# Patient Record
Sex: Female | Born: 1950 | Race: White | Hispanic: No | Marital: Single | Smoking: Former smoker
Health system: Southern US, Community
[De-identification: ages and names within clinical notes are randomized; demographics above are authoritative.]

## PROBLEM LIST (undated history)

## (undated) DIAGNOSIS — M899 Disorder of bone, unspecified: Secondary | ICD-10-CM

## (undated) DIAGNOSIS — K279 Peptic ulcer, site unspecified, unspecified as acute or chronic, without hemorrhage or perforation: Secondary | ICD-10-CM

## (undated) DIAGNOSIS — M949 Disorder of cartilage, unspecified: Secondary | ICD-10-CM

## (undated) DIAGNOSIS — E785 Hyperlipidemia, unspecified: Secondary | ICD-10-CM

## (undated) DIAGNOSIS — I639 Cerebral infarction, unspecified: Secondary | ICD-10-CM

## (undated) HISTORY — PX: PARTIAL GASTRECTOMY: SHX2172

## (undated) HISTORY — DX: Disorder of cartilage, unspecified: M94.9

## (undated) HISTORY — PX: TUBAL LIGATION: SHX77

## (undated) HISTORY — DX: Disorder of bone, unspecified: M89.9

## (undated) HISTORY — PX: BREAST BIOPSY: SHX20

## (undated) HISTORY — DX: Peptic ulcer, site unspecified, unspecified as acute or chronic, without hemorrhage or perforation: K27.9

## (undated) HISTORY — DX: Cerebral infarction, unspecified: I63.9

## (undated) HISTORY — DX: Hyperlipidemia, unspecified: E78.5

---

## 1998-01-25 ENCOUNTER — Other Ambulatory Visit: Admission: RE | Admit: 1998-01-25 | Discharge: 1998-01-25 | Payer: Self-pay | Admitting: *Deleted

## 1999-06-26 DIAGNOSIS — I639 Cerebral infarction, unspecified: Secondary | ICD-10-CM

## 1999-06-26 HISTORY — DX: Cerebral infarction, unspecified: I63.9

## 1999-07-09 ENCOUNTER — Encounter: Payer: Self-pay | Admitting: *Deleted

## 1999-07-09 ENCOUNTER — Inpatient Hospital Stay (HOSPITAL_COMMUNITY): Admission: EM | Admit: 1999-07-09 | Discharge: 1999-07-12 | Payer: Self-pay | Admitting: *Deleted

## 1999-07-18 ENCOUNTER — Other Ambulatory Visit: Admission: RE | Admit: 1999-07-18 | Discharge: 1999-07-18 | Payer: Self-pay | Admitting: *Deleted

## 1999-08-07 ENCOUNTER — Ambulatory Visit (HOSPITAL_COMMUNITY): Admission: RE | Admit: 1999-08-07 | Discharge: 1999-08-07 | Payer: Self-pay | Admitting: *Deleted

## 1999-08-07 ENCOUNTER — Encounter: Payer: Self-pay | Admitting: *Deleted

## 2001-11-08 ENCOUNTER — Encounter: Payer: Self-pay | Admitting: Internal Medicine

## 2001-12-06 ENCOUNTER — Encounter: Payer: Self-pay | Admitting: Internal Medicine

## 2003-10-09 ENCOUNTER — Encounter: Payer: Self-pay | Admitting: Internal Medicine

## 2003-10-10 ENCOUNTER — Encounter: Admission: RE | Admit: 2003-10-10 | Discharge: 2003-10-10 | Payer: Self-pay | Admitting: Family Medicine

## 2003-10-10 ENCOUNTER — Encounter: Payer: Self-pay | Admitting: Internal Medicine

## 2006-10-14 ENCOUNTER — Encounter: Payer: Self-pay | Admitting: Internal Medicine

## 2006-10-14 ENCOUNTER — Ambulatory Visit: Payer: Self-pay | Admitting: Internal Medicine

## 2006-10-14 DIAGNOSIS — Z8673 Personal history of transient ischemic attack (TIA), and cerebral infarction without residual deficits: Secondary | ICD-10-CM | POA: Insufficient documentation

## 2006-10-14 DIAGNOSIS — Z8711 Personal history of peptic ulcer disease: Secondary | ICD-10-CM | POA: Insufficient documentation

## 2006-10-14 DIAGNOSIS — J449 Chronic obstructive pulmonary disease, unspecified: Secondary | ICD-10-CM | POA: Insufficient documentation

## 2007-03-11 ENCOUNTER — Telehealth (INDEPENDENT_AMBULATORY_CARE_PROVIDER_SITE_OTHER): Payer: Self-pay | Admitting: *Deleted

## 2007-03-11 ENCOUNTER — Ambulatory Visit: Payer: Self-pay | Admitting: Family Medicine

## 2007-03-11 LAB — CONVERTED CEMR LAB
Basophils Absolute: 0 10*3/uL (ref 0.0–0.1)
Basophils Relative: 0.5 % (ref 0.0–1.0)
Cholesterol: 219 mg/dL (ref 0–200)
Direct LDL: 165.6 mg/dL
Eosinophils Absolute: 0.1 10*3/uL (ref 0.0–0.6)
Eosinophils Relative: 1.3 % (ref 0.0–5.0)
Glucose, Bld: 87 mg/dL (ref 70–99)
HCT: 48.8 % — ABNORMAL HIGH (ref 36.0–46.0)
HDL: 49.8 mg/dL (ref >39.0)
Hemoglobin: 16.6 g/dL — ABNORMAL HIGH (ref 12.0–15.0)
Lymphocytes Relative: 20.5 % (ref 12.0–46.0)
MCHC: 34 g/dL (ref 30.0–36.0)
MCV: 90.1 fL (ref 78.0–100.0)
Monocytes Absolute: 0.4 10*3/uL (ref 0.2–0.7)
Monocytes Relative: 5.3 % (ref 3.0–11.0)
Neutro Abs: 5.6 10*3/uL (ref 1.4–7.7)
Neutrophils Relative %: 72.4 % (ref 43.0–77.0)
Platelets: 249 10*3/uL (ref 150–400)
RBC: 5.41 M/uL — ABNORMAL HIGH (ref 3.87–5.11)
RDW: 13.1 % (ref 11.5–14.6)
TSH: 0.71 microintl units/mL (ref 0.35–5.50)
Total CHOL/HDL Ratio: 4.4
Triglycerides: 67 mg/dL (ref 0–149)
VLDL: 13 mg/dL (ref 0–40)
WBC: 7.7 10*3/uL (ref 4.5–10.5)

## 2007-03-14 ENCOUNTER — Encounter (INDEPENDENT_AMBULATORY_CARE_PROVIDER_SITE_OTHER): Payer: Self-pay | Admitting: *Deleted

## 2007-06-13 ENCOUNTER — Encounter (INDEPENDENT_AMBULATORY_CARE_PROVIDER_SITE_OTHER): Payer: Self-pay | Admitting: Family Medicine

## 2008-01-03 ENCOUNTER — Encounter: Payer: Self-pay | Admitting: Family Medicine

## 2008-01-12 ENCOUNTER — Encounter (INDEPENDENT_AMBULATORY_CARE_PROVIDER_SITE_OTHER): Payer: Self-pay | Admitting: *Deleted

## 2008-02-20 ENCOUNTER — Ambulatory Visit: Payer: Self-pay | Admitting: Internal Medicine

## 2008-02-20 DIAGNOSIS — E785 Hyperlipidemia, unspecified: Secondary | ICD-10-CM | POA: Insufficient documentation

## 2008-02-27 ENCOUNTER — Ambulatory Visit: Payer: Self-pay | Admitting: Internal Medicine

## 2008-03-02 ENCOUNTER — Telehealth (INDEPENDENT_AMBULATORY_CARE_PROVIDER_SITE_OTHER): Payer: Self-pay | Admitting: *Deleted

## 2008-03-02 LAB — CONVERTED CEMR LAB
BUN: 7 mg/dL (ref 6–23)
Cholesterol: 222 mg/dL (ref 0–200)
Creatinine, Ser: 0.7 mg/dL (ref 0.4–1.2)
GFR calc Af Amer: 111 mL/min
GFR calc non Af Amer: 91 mL/min
HDL: 53 mg/dL (ref >39.0)
VLDL: 18 mg/dL (ref 0–40)

## 2008-03-13 ENCOUNTER — Telehealth (INDEPENDENT_AMBULATORY_CARE_PROVIDER_SITE_OTHER): Payer: Self-pay | Admitting: *Deleted

## 2008-04-04 ENCOUNTER — Telehealth (INDEPENDENT_AMBULATORY_CARE_PROVIDER_SITE_OTHER): Payer: Self-pay | Admitting: *Deleted

## 2008-07-25 ENCOUNTER — Encounter: Payer: Self-pay | Admitting: Internal Medicine

## 2008-08-17 ENCOUNTER — Encounter: Payer: Self-pay | Admitting: Internal Medicine

## 2008-09-26 ENCOUNTER — Encounter: Payer: Self-pay | Admitting: Pulmonary Disease

## 2008-11-14 ENCOUNTER — Encounter: Payer: Self-pay | Admitting: Pulmonary Disease

## 2008-11-15 ENCOUNTER — Encounter: Payer: Self-pay | Admitting: Pulmonary Disease

## 2009-02-01 ENCOUNTER — Encounter: Payer: Self-pay | Admitting: Internal Medicine

## 2009-02-04 ENCOUNTER — Encounter: Payer: Self-pay | Admitting: Internal Medicine

## 2009-02-07 ENCOUNTER — Encounter: Payer: Self-pay | Admitting: Internal Medicine

## 2009-02-12 ENCOUNTER — Encounter: Payer: Self-pay | Admitting: Internal Medicine

## 2009-02-13 ENCOUNTER — Encounter: Payer: Self-pay | Admitting: Internal Medicine

## 2009-02-18 ENCOUNTER — Ambulatory Visit: Payer: Self-pay | Admitting: Internal Medicine

## 2009-02-19 ENCOUNTER — Encounter: Payer: Self-pay | Admitting: Internal Medicine

## 2009-02-28 ENCOUNTER — Telehealth: Payer: Self-pay | Admitting: Internal Medicine

## 2009-03-27 ENCOUNTER — Encounter (INDEPENDENT_AMBULATORY_CARE_PROVIDER_SITE_OTHER): Payer: Self-pay | Admitting: *Deleted

## 2009-03-27 ENCOUNTER — Ambulatory Visit: Payer: Self-pay | Admitting: Internal Medicine

## 2009-03-27 DIAGNOSIS — M810 Age-related osteoporosis without current pathological fracture: Secondary | ICD-10-CM | POA: Insufficient documentation

## 2009-03-27 DIAGNOSIS — M899 Disorder of bone, unspecified: Secondary | ICD-10-CM

## 2009-03-27 HISTORY — DX: Disorder of bone, unspecified: M89.9

## 2009-04-01 ENCOUNTER — Telehealth (INDEPENDENT_AMBULATORY_CARE_PROVIDER_SITE_OTHER): Payer: Self-pay | Admitting: *Deleted

## 2009-04-15 ENCOUNTER — Telehealth: Payer: Self-pay | Admitting: Internal Medicine

## 2009-05-04 LAB — HM COLONOSCOPY: HM Colonoscopy: NORMAL

## 2009-05-08 ENCOUNTER — Encounter: Payer: Self-pay | Admitting: Pulmonary Disease

## 2009-05-10 ENCOUNTER — Encounter: Payer: Self-pay | Admitting: Internal Medicine

## 2009-05-31 ENCOUNTER — Telehealth (INDEPENDENT_AMBULATORY_CARE_PROVIDER_SITE_OTHER): Payer: Self-pay | Admitting: *Deleted

## 2009-06-11 ENCOUNTER — Telehealth (INDEPENDENT_AMBULATORY_CARE_PROVIDER_SITE_OTHER): Payer: Self-pay | Admitting: *Deleted

## 2009-06-14 ENCOUNTER — Ambulatory Visit: Payer: Self-pay | Admitting: Pulmonary Disease

## 2009-06-14 DIAGNOSIS — J438 Other emphysema: Secondary | ICD-10-CM | POA: Insufficient documentation

## 2009-06-14 DIAGNOSIS — J9610 Chronic respiratory failure, unspecified whether with hypoxia or hypercapnia: Secondary | ICD-10-CM | POA: Insufficient documentation

## 2009-06-17 ENCOUNTER — Encounter: Payer: Self-pay | Admitting: Pulmonary Disease

## 2009-09-23 ENCOUNTER — Ambulatory Visit: Payer: Self-pay | Admitting: Internal Medicine

## 2009-09-30 ENCOUNTER — Telehealth: Payer: Self-pay | Admitting: Internal Medicine

## 2009-10-14 ENCOUNTER — Ambulatory Visit: Payer: Self-pay | Admitting: Pulmonary Disease

## 2009-11-01 ENCOUNTER — Telehealth (INDEPENDENT_AMBULATORY_CARE_PROVIDER_SITE_OTHER): Payer: Self-pay | Admitting: *Deleted

## 2010-01-20 ENCOUNTER — Encounter: Payer: Self-pay | Admitting: Internal Medicine

## 2010-01-20 ENCOUNTER — Telehealth (INDEPENDENT_AMBULATORY_CARE_PROVIDER_SITE_OTHER): Payer: Self-pay | Admitting: *Deleted

## 2010-02-10 ENCOUNTER — Telehealth: Payer: Self-pay | Admitting: Family Medicine

## 2010-02-10 ENCOUNTER — Ambulatory Visit: Payer: Self-pay | Admitting: Family Medicine

## 2010-02-11 ENCOUNTER — Encounter: Payer: Self-pay | Admitting: Family Medicine

## 2010-02-12 ENCOUNTER — Ambulatory Visit: Payer: Self-pay | Admitting: Pulmonary Disease

## 2010-02-13 ENCOUNTER — Encounter: Payer: Self-pay | Admitting: Internal Medicine

## 2010-02-21 ENCOUNTER — Telehealth: Payer: Self-pay | Admitting: Internal Medicine

## 2010-02-21 ENCOUNTER — Ambulatory Visit: Payer: Self-pay | Admitting: Internal Medicine

## 2010-02-23 LAB — CONVERTED CEMR LAB
ALT: 11 units/L (ref 0–35)
AST: 18 units/L (ref 0–37)
Basophils Absolute: 0 10*3/uL (ref 0.0–0.1)
Chloride: 101 meq/L (ref 96–112)
Cholesterol: 183 mg/dL (ref 0–200)
Creatinine, Ser: 0.7 mg/dL (ref 0.4–1.2)
Eosinophils Absolute: 0.2 10*3/uL (ref 0.0–0.7)
GFR calc non Af Amer: 93.77 mL/min (ref >60)
Hemoglobin: 14.2 g/dL (ref 12.0–15.0)
LDL Cholesterol: 117 mg/dL — ABNORMAL HIGH (ref 0–99)
Lymphocytes Relative: 27.2 % (ref 12.0–46.0)
Lymphs Abs: 1.7 10*3/uL (ref 0.7–4.0)
MCHC: 33.3 g/dL (ref 30.0–36.0)
MCV: 89.4 fL (ref 78.0–100.0)
Monocytes Absolute: 0.5 10*3/uL (ref 0.1–1.0)
Neutro Abs: 3.9 10*3/uL (ref 1.4–7.7)
Potassium: 4.3 meq/L (ref 3.5–5.1)
RDW: 14.5 % (ref 11.5–14.6)
Triglycerides: 35 mg/dL (ref 0.0–149.0)

## 2010-04-01 ENCOUNTER — Ambulatory Visit: Payer: Self-pay | Admitting: Internal Medicine

## 2010-04-09 ENCOUNTER — Telehealth: Payer: Self-pay | Admitting: Internal Medicine

## 2010-04-23 ENCOUNTER — Encounter: Payer: Self-pay | Admitting: Internal Medicine

## 2010-05-08 ENCOUNTER — Encounter: Payer: Self-pay | Admitting: Internal Medicine

## 2010-05-21 ENCOUNTER — Telehealth (INDEPENDENT_AMBULATORY_CARE_PROVIDER_SITE_OTHER): Payer: Self-pay | Admitting: *Deleted

## 2010-05-28 ENCOUNTER — Ambulatory Visit
Admission: RE | Admit: 2010-05-28 | Discharge: 2010-05-28 | Payer: Self-pay | Source: Home / Self Care | Attending: Pulmonary Disease | Admitting: Pulmonary Disease

## 2010-06-16 ENCOUNTER — Encounter: Payer: Self-pay | Admitting: Internal Medicine

## 2010-06-22 LAB — CONVERTED CEMR LAB
BUN: 7 mg/dL
CO2, serum: 28 mmol/L
Calcium: 8.7 mg/dL
Cholesterol: 187 mg/dL
Glucose, Bld: 108 mg/dL
Hemoglobin: 12.6 g/dL
Hgb A1c MFr Bld: 5.6 %
RBC count: 4.48 10*6/uL
platelet count: 243 10*3/uL

## 2010-06-24 NOTE — Miscellaneous (Signed)
Summary: labs from [facility]  Clinical Lists Changes  Observations: Added new observation of VIT D 25-OH: 15 ng/mL (02/13/2009 8:40) Added new observation of HGBA1C: 5.6 % (02/13/2009 8:39) Added new observation of TRIGLYCERIDE: 41 mg/dL (40/98/1191 4:78) Added new observation of HDL: 48 mg/dL (29/56/2130 8:65) Added new observation of LDL: 131 mg/dL (78/46/9629 5:28) Added new observation of CHOLESTEROL: 187 mg/dL (41/32/4401 0:27) Added new observation of PLATELET CNT: 243 10*3/microliter (02/13/2009 8:37) Added new observation of HCT: 0.40 % (02/13/2009 8:37) Added new observation of HGB: 12.6 g/dL (25/36/6440 3:47) Added new observation of RBC: 4.48 10*6/mm3 (02/13/2009 8:37) Added new observation of WBC: 8.4 10*3/mm3 (02/13/2009 8:37) Added new observation of TSH: 0.80 microintl units/mL (02/13/2009 8:36) Added new observation of PROTEIN, TOT: 6.5 g/dL (42/59/5638 7:56) Added new observation of ALBUMIN: 3.5 g/dL (43/32/9518 8:41) Added new observation of BILI TOTAL: 0.6 mg/dL (66/10/3014 0:10) Added new observation of ALK PHOS: 57 units/L (02/13/2009 8:35) Added new observation of CALCIUM: 8.7 mg/dL (93/23/5573 2:20) Added new observation of BG RANDOM: 108 mg/dL (25/42/7062 3:76) Added new observation of CREATININE: 0.7 mg/dL (28/31/5176 1:60) Added new observation of BUN: 7 mg/dL (73/71/0626 9:48) Added new observation of CO2 TOTAL: 28 mmol/L (02/13/2009 8:35) Added new observation of CHLORIDE: 101 mmol/L (02/13/2009 8:35) Added new observation of POTASSIUM: 3.9 mmol/L (02/13/2009 8:35) Added new observation of SODIUM: 140 mmol/L (02/13/2009 8:35)      Chemistry Labs Test Date: 02/13/2009                      Value Units        H/L   Reference  Sodium:             140   mmol/L             (137-145) Potassium:          3.9   mmol/L             (3.6-5.0) Chloride:           101   mmol/L             (101-111) CO2:                28    mmol/L             (22-31) BUN:                 7     mg/dL              (5-46) Creatinine:         0.7   mg/dL              (2.7-0.3) Glucose-random:     108   mg/dL              (50-093) Calcium (total):    8.7   mg/dL         L    (8-18.2) Alkaline P'tase:    57    U/L                (10-120) T. Bili:            0.6   mg/dL              (9.9-3.7) Albumin:            3.5   g/dL               (3-5) Total Protein:  6.5   g/dL               (4-7)    Lab Entry Test Date: 09/22/201009/22/2010                        Value        Units        H/L   Reference  TSH:                  0.80         mIU/ml             (0.5-5.0)    Complete Blood Count Test Date: 02/13/2009             Value   Units      H/L    Reference  WBC:       8.4   X 10^3/uL          (3.5-10.0) RBC:       4.48  X 10^6/uL          (3.60-5.00) Hgb:       12.6  g/dl               (41.3-24.4) Hct:       0.40  %             L    (35.0-46.0) Platelets: 243   X 10^3/uL          (150-450)    Lipid Panel Test Date: 02/13/2009                        Value        Units        H/L   Reference  Cholesterol:          187          mg/dL              (010-272) LDL Cholesterol:      131          mg/dL              (53-664) HDL Cholesterol:      48           mg/dL              (40-34) Triglyceride:         41           mg/dL         L    (74-259) DGLO7F                5.6                               Vit D. 25-OH:         15           ng/ml              (16-74)

## 2010-06-24 NOTE — Progress Notes (Signed)
Summary: left msg for pt to call  Phone Note Call from Patient Call back at Home Phone [phone]   Caller: Patient Summary of Call: pt called and left msg, did you receive reports from [facility]?  Initial call taken by: Kandice Hams,  May 31, 2009 1:42 PM  Follow-up for Phone Call        Called pt and left msg to call for clarification what form? nothing here from [facility] .Kandice Hams  May 31, 2009 1:49 PM  pt called  back she says she had 15  or 20 test  done and Alena Bills said to send to her email . should have been done in december,  I gave [facility] the email and the fax # Informed pt we do not have.  She will have [facility] fax I gave her fax # [phone] .Kandice Hams  May 31, 2009 4:49 PM   Follow-up by: Kandice Hams,  May 31, 2009 4:49 PM

## 2010-06-24 NOTE — Assessment & Plan Note (Signed)
Summary: RIGHT EAR PAIN//PH   Vital Signs:  Patient profile:   60 year old female Height:      63 inches (160.02 cm) Weight:      131 pounds (59.55 kg) BMI:     23.29 Temp:     98.0 degrees F (36.67 degrees C) oral BP sitting:   116 / 70  (left arm) Cuff size:   regular  Vitals Entered By: Lucious Groves CMA (February 10, 2010 11:00 AM) CC: C/O right ear pain./kb Is Patient Diabetic? No Pain Assessment Patient in pain? yes     Location: right ear Intensity: 5 Type: throbbing Onset of pain  5 days ago Comments Patient notes that she has has trouble with the ear for a total of one week, but has gotten progressively worse since Thursday. She notes that it originally felt like water in her ear, but not the ear seems completely clogged and she cannot hear. Requests flu shot also./kb   History of Present Illness: 60 yo woman here today for R ear pain.  sxs first started 1 week ago after washing her hair.  thought it was just water in the ear but sxs have worsened.  'it's totally closed up.  i can't hear out of it'.  described as a slight throb.  more bothersome to pt is the hearing problems.  has tried to irrigate w/ warm water followed by a qtip w/out relief.  no fevers.  some drainage from the ear while lying on that side.  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 Inhale Contents of 1 Capsule Once A Day 2)  Advair Diskus 250-50 Mcg/dose  Misc (Fluticasone-Salmeterol) .... Inhale 1 Puff Once Daily 3)  Plavix 75 Mg  Tabs (Clopidogrel Bisulfate) .... Take One Tablet Daily 4)  Ventolin  Hfa 108 (90 Base) Mcg/act  Aers (Albuterol Sulfate) .Marland Kitchen.. 1-2 Puffs Every 4-6 Hours As Needed 5)  Duoneb 0.5-2.5 (3) Mg/7ml Soln (Ipratropium-Albuterol) .... As Needed 6)  Oxygen .... 2 Lpm Pulsed 24/7 7)  Simvastatin 20 Mg Tabs (Simvastatin) .Marland Kitchen.. 1 By Mouth At Bedtime 8)  Alendronate Sodium 70 Mg Tabs (Alendronate Sodium) .Marland Kitchen.. 1 By Mouth Qwk - Take On Empty Stomach With Full Glass of Water, Remain Upright For After Taking  Allergies (verified): No Known Drug Allergies  Review of Systems      See HPI  Physical Exam  General:  alert, well-developed, and well-nourished.   Head:  NCAT, no TTP over sinuses Eyes:  no injxn or inflammation Ears:  R TM obscurred by pearly white debris in the canal.  no pain w/ manipulation of pinna.  canal very erythematous Nose:  O2 in place Lungs:  normal respiratory effort, no intercostal retractions, and no accessory muscle use.  decreased BS Heart:  normal rate, regular rhythm, and no murmur.     Impression & Recommendations:  Problem # 1:  UNSPECIFIED OTITIS MEDIA (ICD-382.9) Assessment New pt's ear debris and lack of pain w/ manipulation of pinna suggests perforated OM- especially since she had pain and then relief.  start Ciprodex to cover  both ruptured OM and possible otitis externa.  reviewed supportive care and red flags that should prompt return.  Pt expresses understanding and is in agreement w/ this plan.  Complete Medication List: 1)  Spiriva Handihaler 18 Mcg Caps (Tiotropium bromide monohydrate) .... Inhale contents of 1 capsule once a day 2)  Advair Diskus 250-50 Mcg/dose Misc (Fluticasone-salmeterol) .... Inhale 1 puff once daily 3)  Plavix 75 Mg Tabs (Clopidogrel bisulfate) .... Take one tablet daily 4)  Ventolin Hfa 108 (90 Base) Mcg/act Aers (Albuterol sulfate) .Marland Kitchen.. 1-2 puffs every 4-6 hours as needed 5)  Duoneb 0.5-2.5 (3) Mg/38ml Soln (Ipratropium-albuterol) .... As needed 6)  Oxygen  .... 2 lpm pulsed 24/7 7)  Simvastatin 20  Mg Tabs (Simvastatin) .Marland Kitchen.. 1 by mouth at bedtime 8)  Alendronate Sodium 70 Mg Tabs (Alendronate sodium) .Marland Kitchen.. 1 by mouth qwk - take on empty stomach with full glass of water, remain upright for after taking 9)  Ciprodex 0.3-0.1 % Susp (Ciprofloxacin-dexamethasone) .... 4 drops in affected ear two times a day x7days for infection.  Patient Instructions: 1)  Follow up as scheduled w/ Dr Drue Novel 2)  You have an ear infection combo- middle and outer ear 3)  Use the Ciprodex drops as directed- this will treat both infections 4)  Try and avoid fluid in your ear for the next few weeks as this heals 5)  Call with any questions or concerns 6)  Hang in there!!! Prescriptions: CIPRODEX 0.3-0.1 % SUSP (CIPROFLOXACIN-DEXAMETHASONE) 4 drops in affected ear two times a day x7days for infection.  #83ml x 0   Entered and Authorized by:   Neena Rhymes MD   Signed by:   Neena Rhymes MD on 02/10/2010   Method used:   Electronically to        [facility] [phone]* (retail)       [address]       [place], [place]  [address]       Ph: [phone]       Fax: [phone]   RxID:   [id number]

## 2010-06-24 NOTE — Progress Notes (Signed)
Summary: labs from [facility] 01-2009  Phone Note Outgoing Call   Summary of Call: labs from [facility] 01-2009 reviewed: --hemoglobin is slightly low compared to previous values, recheck on  return to the office --vitamin D is low, start ergocalciferol 50,000 units weekly for 3 months -- start Fosamax 70mg  weekly, please discuss with the patient the precautions --cholesterol is elevated, LDL goal around 100 due to history of a stroke start simvastatin 20 mg daily -- office visit in two months   Follow-up for Phone Call        discussed with pt.Marland KitchenMarland KitchenShary Decamp  Sep 30, 2009 11:31 AM     New/Updated Medications: ERGOCALCIFEROL 50000 UNIT CAPS (ERGOCALCIFEROL) 1 by mouth qwk x 12 weeks SIMVASTATIN 20 MG TABS (SIMVASTATIN) 1 by mouth at bedtime ALENDRONATE SODIUM 70 MG TABS (ALENDRONATE SODIUM) 1 by mouth qwk - take on empty stomach with full glass of water, remain upright for after taking Prescriptions: ALENDRONATE SODIUM 70 MG TABS (ALENDRONATE SODIUM) 1 by mouth qwk - take on empty stomach with full glass of water, remain upright for after taking  #4 x 3   Entered by:   Shary Decamp   Authorized by:   Nolon Rod. Kawena Lyday MD   Signed by:   Shary Decamp on 09/30/2009   Method used:   Electronically to        [facility] [phone]* (retail)       [address]       [place], [place]  [address]       Ph: [phone]       Fax: [phone]   RxID:   [id number] SIMVASTATIN 20 MG TABS (SIMVASTATIN) 1 by mouth at bedtime  #30 x 3   Entered by:   Shary Decamp   Authorized by:   Nolon Rod. Dinero Chavira MD   Signed by:   Shary Decamp on 09/30/2009   Method used:   Electronically to        [facility] [phone]* (retail)       [address]       [place], [place]  [address]       Ph: [phone]       Fax: [phone]   RxID:   [id number] ERGOCALCIFEROL 50000 UNIT CAPS (ERGOCALCIFEROL) 1 by mouth qwk x 12 weeks  #12 x 0   Entered by:   Shary Decamp   Authorized by:   Nolon Rod. Kristi Hyer MD   Signed by:    Shary Decamp on 09/30/2009   Method used:   Electronically to        [facility] [phone]* (retail)       [address]       [place], [place]  [address]       Ph: [phone]       Fax: [phone]   RxID:   [id number]

## 2010-06-24 NOTE — Assessment & Plan Note (Signed)
Summary: rov for emphysema/chronic rf   Copy to:  [facility] at [facility] Primary Provider/Referring Provider:  Nolon Rod. Paz MD  CC:  Pt is here for a 4 month f/u appt.  Pt denies changes in breathing since last visit- sob with exertion.  Pt states she rarely coughs- but will occ cough up cream colored sputum.   Pt states she is only using Advair 1 puff once a day d/t costs..  History of Present Illness: the pt comes in today for f/u of her known emphysema with chronic respiratory failure.  She is continuing on spiriva and advair, but has decreased her advair dose to once a day due to expense.  Overall, she feels she is doing fairly well, and is maintaining her exertional tolerance.  She has no significant cough or mucus.  She denies any LE edema, and has been wearing oxygen compliantly.  Current Medications (verified): 1)  Spiriva Handihaler 18 Mcg  Caps (Tiotropium Bromide Monohydrate) .... Inhale Contents of 1 Capsule Once A Day 2)  Advair Diskus 250-50 Mcg/dose  Misc (Fluticasone-Salmeterol) .... Inhale 1 Puff Once Daily 3)  Plavix 75 Mg  Tabs (Clopidogrel Bisulfate) .... Take One Tablet Daily 4)  Ventolin Hfa 108 (90 Base) Mcg/act  Aers (Albuterol Sulfate) .Marland Kitchen.. 1-2 Puffs Every 4-6 Hours As Needed 5)  Duoneb 0.5-2.5 (3) Mg/66ml Soln (Ipratropium-Albuterol) .... As Needed 6)  Oxygen .... 2 Lpm Pulsed 24/7 7)  Ergocalciferol 50000 Unit Caps (Ergocalciferol) .Marland Kitchen.. 1 By Mouth Qwk X 12 Weeks 8)  Simvastatin 20 Mg Tabs (Simvastatin) .Marland Kitchen.. 1 By Mouth At Bedtime 9)  Alendronate Sodium 70 Mg Tabs (Alendronate Sodium) .Marland Kitchen.. 1 By Mouth Qwk - Take On Empty Stomach With Full Glass of Water, Remain Upright For After Taking  Allergies (verified): No Known Drug Allergies  Review of Systems       The patient complains of shortness of breath with activity, productive cough, and sneezing.  The patient denies shortness of breath at rest, non-productive cough, coughing up blood, chest pain, irregular  heartbeats, acid heartburn, indigestion, loss of appetite, weight change, abdominal pain, difficulty swallowing, sore throat, tooth/dental problems, headaches, nasal congestion/difficulty breathing through nose, itching, ear ache, anxiety, depression, hand/feet swelling, joint stiffness or pain, rash, change in color of mucus, and fever.    Vital Signs:  Patient profile:   60 year old female Height:      63 inches Weight:      135.38 pounds BMI:     24.07 O2 Sat:      94 % on 2 LPM pulsed Temp:     98.7 degrees F oral Pulse rate:   93 / minute BP sitting:   130 / 72  (left arm) Cuff size:   regular  Vitals Entered By: Arman Filter LPN (Oct 14, 2009 2:09 PM)  O2 Flow:  2 LPM pulsed CC: Pt is here for a 4 month f/u appt.  Pt denies changes in breathing since last visit- sob with exertion.  Pt states she rarely coughs- but will occ cough up cream colored sputum.   Pt states she is only using Advair 1 puff once a day d/t costs. Comments Medications reviewed with patient Arman Filter LPN  Oct 14, 2009 2:10 PM    Physical Exam  General:  wd female in nad Lungs:  decreased bs throughout, but no wheezing Heart:  rrr, no mrg Extremities:  no edema or cyanosis Neurologic:  alert and oriented, moves all 4.   Impression &  Recommendations:  Problem # 1:  EMPHYSEMA (ICD-492.8) the pt is maintaining a stable baseline wrt her exertional tolerance and daily symptoms.  She has not had a recent infection or acute exacerbation.  I have asked her to try and get back on advair twice a day, and have given her samples and a set of discount vouchers to try and help with the cost.  I have asked her to continue with her aggressive exercise program.  She is due for a cxr since she has not had one in quite sometime.  Problem # 2:  CHRONIC RESPIRATORY FAILURE (ICD-518.83) she is compliant with oxygen, and maintaining adequate sats.  Medications Added to Medication List This Visit: 1)  Advair Diskus  250-50 Mcg/dose Misc (Fluticasone-salmeterol) .... Inhale 1 puff once daily  Other Orders: Est. Patient Level III ([address]) T-2 View CXR (71020TC)  Patient Instructions: 1)  will check an xray today, and let you know the results.  Need to do this yearly. 2)  continue on current meds. 3)  stay active as you are doing. 4)  followup with me in 4mos.  Prescriptions: ADVAIR DISKUS 250-50 MCG/DOSE  MISC (FLUTICASONE-SALMETEROL) inhale 1 puff once daily  #1 x 6   Entered and Authorized by:   Barbaraann Share MD   Signed by:   Barbaraann Share MD on 10/14/2009   Method used:   Print then Give to Patient   RxID:   [id number]

## 2010-06-24 NOTE — Progress Notes (Signed)
Summary: pharmacy quantity verificaton  Phone Note From Pharmacy Call back at [phone]   Caller: [id number]* Summary of Call: Please confirm quantity with MD for Plavix. Initial call taken by: Harold Barban,  November 01, 2009 12:50 PM    Prescriptions: PLAVIX 75 MG  TABS (CLOPIDOGREL BISULFATE) Take one tablet daily  #30 x 6   Entered by:   Jeremy Johann CMA   Authorized by:   Nolon Rod. Paz MD   Signed by:   Jeremy Johann CMA on 11/01/2009   Method used:   Re-Faxed to ...       [facility] [phone]* (retail)       [address]       [place], [place]  [address]       Ph: [phone]       Fax: [phone]   RxID:   [id number]

## 2010-06-24 NOTE — Progress Notes (Signed)
Summary: patient didnt have cpx 110310  Phone Note Call from Patient Call back at Home Phone [phone]   Caller: Patient Summary of Call: patient was scheduled for cpx today [phone] but she was told she wasnt due for cpx - patient was also scheduled for cpx 110310 she didnt have cpx then (office note shows patient declined) may have been coded cpx - she is scheduled 111011 cpx want to know if she can schedule sooner  Initial call taken by: Okey Regal Spring,  January 20, 2010 4:14 PM  Follow-up for Phone Call        chart reviewed, the patient is  right, she did not have a CPX last year. Please tell her I apologize  and rescheduled a physical for this week Follow-up by: Jose E. Paz MD,  January 20, 2010 5:35 PM  Additional Follow-up for Phone Call Additional follow up Details #1::        called patient & scheduled cpx [id number] .Marland KitchenOkey Regal Spring  January 21, 2010 9:47 AM

## 2010-06-24 NOTE — Assessment & Plan Note (Signed)
Summary: 6 month check.cbs   Vital Signs:  Patient profile:   60 year old female Height:      63 inches Weight:      130 pounds BMI:     23.11 O2 Sat:      93 % on 2 L/min Pulse rate:   91 / minute BP sitting:   120 / 74  Vitals Entered By: Shary Decamp (Sep 23, 2009 2:42 PM)  O2 Flow:  2 L/min CC: rov   History of Present Illness: ROV  doing great walks 1 mile a day still going to pulmonary rehab @  [facility]   Current Medications (verified): 1)  Spiriva Handihaler 18 Mcg  Caps (Tiotropium Bromide Monohydrate) .... Inhale Contents of 1 Capsule Once A Day 2)  Advair Diskus 250-50 Mcg/dose  Misc (Fluticasone-Salmeterol) .... One Puff Two Times A Day 3)  Plavix 75 Mg  Tabs (Clopidogrel Bisulfate) .... Take One Tablet Daily 4)  Ventolin Hfa 108 (90 Base) Mcg/act  Aers (Albuterol Sulfate) .Marland Kitchen.. 1-2 Puffs Every 4-6 Hours As Needed 5)  Duoneb 0.5-2.5 (3) Mg/64ml Soln (Ipratropium-Albuterol) .... As Needed 6)  Oxygen .... 2 Lpm Pulsed 24/7 7)  Calcium 1200mg  8)  Vitamin D3 500units 9)  Vitamin D 1000  Allergies (verified): No Known Drug Allergies  Past History:  Past Medical History: h/o PUD, s/p partial gastrectomy 1985.   BREAST BIOPSY, HX OF (ICD-V15.9) CVA  06-1999 Severe emphysema--FEV1 0.49, FEV1% 27 on 5/10 Hyperlipidemia  Past Surgical History: Reviewed history from 06/14/2009 and no changes required. 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 one living child Single lives by self tobacco-- quit 2007.  started at age 49.  1 ppd.  ETOH-- rarely  on disability.  previously worked in Clinical biochemist.  Review of Systems        CVA-- good medication compliance w/ plavix , needs a Rx  Severe emphysema-- symptoms well controlled , after eval @ [facility] she was not considered a transplant candidate Hyperlipidemia-- off meds x a while   Physical  Exam  General:  alert, well-developed, and well-nourished.   Lungs:  normal respiratory effort, no intercostal retractions, and no accessory muscle use.  decreased BS Heart:  normal rate, regular rhythm, and no murmur.   Extremities:  no pretibial edema bilaterally    Impression & Recommendations:  Problem # 1:  EMPHYSEMA (ICD-492.8) stable, doing very well after eval at [facility] , was not considered  a lung  transplant candidate    Problem # 2:  OSTEOPENIA (ICD-733.90) T score -2.0 on 01-2009 sister has osteopenia, patient herself had a broken arm in the 80s. on Ca , Vit D will bring labs done at [facility] (not available to me at this point, needs  BMP , Ca and Vit D levels fosamax if labs ok   Problem # 3:  HYPERLIPIDEMIA (ICD-272.4) off meds  needs FLP AST ALT will bring labs form [facility], she has a copy   Problem # 4:  CVA (ICD-434.91) RF plavix  Her updated medication list for this problem includes:    Plavix 75 Mg Tabs (Clopidogrel bisulfate) .Marland Kitchen... Take one tablet daily  Complete Medication List: 1)  Spiriva Handihaler 18 Mcg Caps (Tiotropium bromide monohydrate) .... Inhale contents of 1 capsule once a day 2)  Advair Diskus 250-50 Mcg/dose Misc (Fluticasone-salmeterol) .... One puff two times a day 3)  Plavix 75 Mg Tabs (  Clopidogrel bisulfate) .... Take one tablet daily 4)  Ventolin Hfa 108 (90 Base) Mcg/act Aers (Albuterol sulfate) .Marland Kitchen.. 1-2 puffs every 4-6 hours as needed 5)  Duoneb 0.5-2.5 (3) Mg/36ml Soln (Ipratropium-albuterol) .... As needed 6)  Oxygen  .... 2 lpm pulsed 24/7 7)  Calcium 1200mg   8)  Vitamin D3 500units  9)  Vitamin D 1000   Patient Instructions: 1)  please bring the copies of all your labs at your earliest convinience 2)  Please schedule a follow-up appointment in 6 months (yearly check up) Prescriptions: PLAVIX 75 MG  TABS (CLOPIDOGREL BISULFATE) Take one tablet daily  #3- x 6   Entered by:   Shary Decamp   Authorized by:   Nolon Rod. Rebekkah Powless MD   Signed by:    Shary Decamp on 09/23/2009   Method used:   Print then Give to Patient   RxID:   [id number]

## 2010-06-24 NOTE — Progress Notes (Signed)
Summary: med question  Phone Note Call from Patient Call back at Home Phone [phone]   Summary of Call: Patient called noting that she is having dental surgery next week and is taking Plavix, simvastatin, and alendronate. She would like to know should she stop these meds and for how long. Please advise. Initial call taken by: Lucious Groves CMA,  April 09, 2010 10:53 AM  Follow-up for Phone Call        --if the dentist thinks that she should be off blood thinners, okay to hold Plavix few days before and after  the procedure  . -- continue simvastatin --continue alendronate unless dentist rec to stop it Follow-up by: Maite Burlison E. Jesus Poplin MD,  April 09, 2010 12:19 PM  Additional Follow-up for Phone Call Additional follow up Details #1::        Patient notified. Additional Follow-up by: Lucious Groves CMA,  April 09, 2010 5:12 PM     Appended Document: med question Faxed this note to patient per her request.

## 2010-06-24 NOTE — Progress Notes (Signed)
Summary: Meds, labs   Phone Note Call from Patient Call back at Home Phone [phone]   Caller: Patient Summary of Call: Pt called to verify, she thought that you told her not to take Calicum with the Fosamax? Please advise.  Initial call taken by: Army Fossa CMA,  February 21, 2010 2:17 PM  Follow-up for Phone Call        -- she needs to continue taking over-the-counter calcium and vitamin D --as far her labs Vitamin D levels great Cholesterol needs improvement (h/o CVA LDL goal < 100), increase simvastatin from 20 to 40 mg daily, call if side effects such as myalgias --follow up in 6 months as planned Follow-up by: Jose E. Paz MD,  February 23, 2010 11:37 AM  Additional Follow-up for Phone Call Additional follow up Details #1::        Pt is aware of results and med changes.  Additional Follow-up by: Army Fossa CMA,  February 24, 2010 9:03 AM    New/Updated Medications: ZOCOR 40 MG TABS (SIMVASTATIN) 1 by mouth at bedtime.

## 2010-06-24 NOTE — Miscellaneous (Signed)
Summary: Flu vacc  Clinical Lists Changes  Orders: Added new Service order of Admin 1st Vaccine (45409) - Signed Added new Service order of Flu Vaccine 79yrs + [phone]) - Signed Observations: Added new observation of FLU VAX VIS: 12/17/09 version (02/11/2010 9:45) Added new observation of FLU VAXLOT: [id number] (02/11/2010 9:45) Added new observation of FLU VAXMFR: [employer] (02/11/2010 9:45) Added new observation of FLU VAX EXP: 11/22/2010 (02/11/2010 9:45) Added new observation of FLU VAX DSE: 0.54ml (02/11/2010 9:45) Added new observation of FLU VAX: Fluvax 3+ (02/11/2010 9:45)    Flu shot was given to patient yesterday while in office. Lucious Groves CMA  February 11, 2010 9:46 AM             Flu Vaccine Consent Questions     Do you have a history of severe allergic reactions to this vaccine? no    Any prior history of allergic reactions to egg and/or gelatin? no    Do you have a sensitivity to the preservative Thimersol? no    Do you have a past history of Guillan-Barre Syndrome? no    Do you currently have an acute febrile illness? no    Have you ever had a severe reaction to latex? no    Vaccine information given and explained to patient? yes    Are you currently pregnant? no    Lot Number:[id number]   Exp Date:11/22/2010   Site Given  Right Deltoid IMlbflu

## 2010-06-24 NOTE — Assessment & Plan Note (Signed)
Summary: O2 SAT./KB  Nurse Visit   Vital Signs:  Patient profile:   60 year old female Height:      63 inches (160.02 cm) Weight:      129.38 pounds (58.81 kg) BMI:     23.00 O2 Sat:      90 % on 2 L/min Temp:     98.2 degrees F (36.78 degrees C) oral BP sitting:   100 / 60  (left arm) Cuff size:   regular  Vitals Entered By: Lucious Groves CMA (April 01, 2010 2:20 PM)  O2 Sat at Rest %:  90 O2 Flow:  2 L/min O2 Sat with Ambulation %:  88 O2 Sat on room air at rest %:  88 O2 Sat on room air ambulating %:  80-83 O2 Use at Home:  Yes CC: O2 sat. for [facility]./kb Comments Patient is aware we will fax this to [facility] at Fax # [phone] Attn: Michelle./kb   Allergies: No Known Drug Allergies

## 2010-06-24 NOTE — Progress Notes (Signed)
  Phone Note Call from Patient Call back at Home Phone [phone]   Caller: Patient Summary of Call: pt called has appt on friday with pulmonary Dr Shelle Iron, wanted to know if we received papers from [facility] and has it been scanned, if so Dr  Shelle Iron can see.  Spoke with pt informed we do have and pages are very light, we made copies and darkened as much as we could, copies up front to  pickup.   Initial call taken by: Kandice Hams,  June 11, 2009 10:09 AM

## 2010-06-24 NOTE — Assessment & Plan Note (Signed)
Summary: too early for a physical,return in November    Vital Signs:  Patient profile:   60 year old female Height:      63 inches Weight:      132.25 pounds O2 Sat:      94 % on 2 L/min Pulse rate:   97 / minute Pulse rhythm:   regular BP sitting:   116 / 76  (left arm) Cuff size:   regular  Vitals Entered By: Army Fossa CMA (January 20, 2010 10:06 AM)  O2 Flow:  2 L/min CC: CPX: fasting Comments Will get Flu shot in Sept had PAP last week- unsure of results Mammo- getting next month.    History of Present Illness: to  early for a physical exam  Preventive Screening-Counseling & Management  Caffeine-Diet-Exercise     Does Patient Exercise: yes     Type of exercise: walking     Times/week: 7  Allergies (verified): No Known Drug Allergies  Past History:  Past Medical History: Reviewed history from 09/23/2009 and no changes required. h/o PUD, s/p partial gastrectomy 1985.   BREAST BIOPSY, HX OF (ICD-V15.9) CVA  06-1999 Severe emphysema--FEV1 0.49, FEV1% 27 on 5/10 Hyperlipidemia  Past Surgical History: Reviewed history from 06/14/2009 and no changes required. 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 Inhale contents of 1 capsule once a day 2)  Advair Diskus 250-50 Mcg/dose Misc (Fluticasone-salmeterol) .... Inhale 1 puff once daily 3)  Plavix 75 Mg Tabs (Clopidogrel bisulfate) .... Take one tablet daily 4)  Ventolin Hfa 108 (90 Base) Mcg/act Aers (Albuterol sulfate) .Marland Kitchen.. 1-2 puffs every 4-6 hours as needed 5)  Duoneb 0.5-2.5 (3) Mg/64ml Soln (Ipratropium-albuterol) .... As needed 6)  Oxygen  .... 2 lpm pulsed 24/7 7)  Simvastatin 20 Mg Tabs  (Simvastatin) .Marland Kitchen.. 1 by mouth at bedtime 8)  Alendronate Sodium 70 Mg Tabs (Alendronate sodium) .Marland Kitchen.. 1 by mouth qwk - take on empty stomach with full glass of water, remain upright for after taking   Risk Factors:  Alcohol use:  no Exercise:  yes    Times per week:  7    Type:  walking

## 2010-06-24 NOTE — Assessment & Plan Note (Signed)
Summary: cpx/lab/shingles vac/cbs- pt IS due for cpx   Vital Signs:  Patient profile:   60 year old female Height:      63 inches Weight:      130.25 pounds Pulse rate:   95 / minute Pulse rhythm:   regular BP sitting:   122 / 84  (left arm) Cuff size:   regular  Vitals Entered By: Army Fossa CMA (February 21, 2010 9:59 AM) CC: CPX, fasting Comments pharm- [facility] [address] states she had flu shot when she saw Dr.Tabori. Had Mammo last week- results being sent to [place].    History of Present Illness: CPX had a ear infection, feels better   Current Medications (verified): 1)  Spiriva Handihaler 18 Mcg  Caps (Tiotropium Bromide Monohydrate) .... Inhale Contents of 1 Capsule Once A Day 2)  Advair Diskus 250-50 Mcg/dose  Misc (Fluticasone-Salmeterol) .... Inhale 1 Puff Once Daily 3)  Plavix 75 Mg  Tabs (Clopidogrel Bisulfate) .... Take One Tablet Daily 4)  Ventolin Hfa 108 (90 Base) Mcg/act  Aers (Albuterol Sulfate) .Marland Kitchen.. 1-2 Puffs Every 4-6 Hours As Needed 5)  Duoneb 0.5-2.5 (3) Mg/70ml Soln (Ipratropium-Albuterol) .... As Needed 6)  Oxygen .... 2 Lpm Pulsed 24/7 7)  Simvastatin 20 Mg Tabs (Simvastatin) .Marland Kitchen.. 1 By Mouth At Bedtime 8)  Alendronate Sodium 70 Mg Tabs (Alendronate Sodium) .Marland Kitchen.. 1 By Mouth Qwk - Take On Empty Stomach With Full Glass of Water, Remain Upright For After Taking  Allergies (verified): No Known Drug Allergies  Past History:  Past Medical History: Reviewed history from 09/23/2009 and no changes required. h/o PUD, s/p partial gastrectomy 1985.   BREAST BIOPSY, HX OF (ICD-V15.9) CVA  06-1999 Severe emphysema--FEV1 0.49, FEV1% 27 on 5/10 Hyperlipidemia  Past Surgical History: Reviewed history from 06/14/2009 and no changes required. 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  started at age 59.  1 ppd.  ETOH-- rarely  on disability.  previously worked in Clinical biochemist. diet--trying to eat healthy  exercise -- daily, walks a mile   Review of Systems General:  Denies fatigue, fever, and weight loss. CV:  Denies chest pain or discomfort and swelling of feet. Resp:  Denies coughing up blood; symptoms stable . GI:  Denies bloody stools, diarrhea, nausea, and vomiting. GU:  Denies dysuria and hematuria. Psych:  Denies anxiety and depression.  Physical Exam  General:  alert, well-developed, and well-nourished.   Ears:  R ear normal and L ear normal.   Neck:  no masses, no thyromegaly, and normal carotid upstroke.   Lungs:  normal respiratory effort, no intercostal retractions, and no accessory muscle use.  decreased BS Heart:  normal rate, regular rhythm, and no murmur.   Abdomen:  soft, non-tender, normal bowel sounds, no distention, no masses, no guarding, and no rigidity.   Extremities:  no pretibial edema bilaterally  Neurologic:  alert & oriented X3, strength normal in all extremities, and gait normal.   Psych:  not anxious appearing and not depressed appearing.     Impression & Recommendations:  Problem # 1:  PREVENTIVE HEALTH CARE (ICD-V70.0) chart reviewed Td 2008 pneumonia shot 01-2009  flu shot 9-11 shingles immunization today  MMG 9-10 neg,  had a mammogram last week, report pending PAP per gyn, last PAP August 2011 per patient    cscope   done @ [facility]  9- 2010, neg, next 10 years   diet and exercise discussed  Orders: Venipuncture ([address]) TLB-ALT (SGPT) (84460-ALT) TLB-AST (SGOT) (84450-SGOT) TLB-BMP (Basic Metabolic Panel-BMET) (80048-METABOL) TLB-CBC Platelet - w/Differential (85025-CBCD) Specimen Handling (09811)  Problem # 2:  OSTEOPENIA (ICD-733.90) T score -2.0 on 01-2009 sister has  osteopenia, patient herself had a broken arm in the 80s. on Ca , Vit D started Fosamax 5-11, good medication compliance , no s/e    Her updated medication list for this problem includes:    Alendronate Sodium 70 Mg Tabs (Alendronate sodium) .Marland Kitchen... 1 by mouth qwk - take on empty stomach with full glass of water, remain upright for after taking  Orders: T-Vitamin D (25-Hydroxy) (91478-29562) Specimen Handling (13086)  Problem # 3:  HYPERLIPIDEMIA (ICD-272.4) started simvastatin 5/11, no s/e  Her updated medication list for this problem includes:    Simvastatin 20 Mg Tabs (Simvastatin) .Marland Kitchen... 1 by mouth at bedtime  Labs Reviewed: SGOT: 20 (02/27/2008)   SGPT: 12 (02/27/2008)   HDL:48 (02/13/2009), 53.0 (02/27/2008)  LDL:131 (02/13/2009), DEL (02/27/2008)  Chol:187 (02/13/2009), 222 (02/27/2008)  Trig:41 (02/13/2009), 92 (02/27/2008)  Orders: TLB-Lipid Panel (80061-LIPID) Specimen Handling (57846)  Complete Medication List: 1)  Spiriva Handihaler 18 Mcg Caps (Tiotropium bromide monohydrate) .... Inhale contents of 1 capsule once a day 2)  Advair Diskus 250-50 Mcg/dose Misc (Fluticasone-salmeterol) .... Inhale 1 puff once daily 3)  Plavix 75 Mg Tabs (Clopidogrel bisulfate) .... Take one tablet daily 4)  Ventolin Hfa 108 (90 Base) Mcg/act Aers (Albuterol sulfate) .Marland Kitchen.. 1-2 puffs every 4-6 hours as needed 5)  Duoneb 0.5-2.5 (3) Mg/18ml Soln (Ipratropium-albuterol) .... As needed 6)  Oxygen  .... 2 lpm pulsed 24/7 7)  Simvastatin 20 Mg Tabs (Simvastatin) .Marland Kitchen.. 1 by mouth at bedtime 8)  Alendronate Sodium 70 Mg Tabs (Alendronate sodium) .Marland Kitchen.. 1 by mouth qwk - take on empty stomach with full glass of water, remain upright for after taking 9)  Ca and Vit D Otc   Other Orders: Zoster (Shingles) Vaccine Live (96295) Admin 1st Vaccine (28413)  Patient Instructions: 1)  Please schedule a follow-up appointment in 6 months .    Risk Factors:  Alcohol use:  yes  PAP Smear History:       Date of Last PAP Smear:  12/23/2009    Results:  normal-per pt  \   Preventive Care Screening  Pap Smear:    Date:  12/23/2009    Results:  normal-per pt     Immunizations Administered:  Zostavax # 1:    Vaccine Type: Zostavax    Site: left deltoid    Mfr: Merck    Dose: 0.5 ml    Route: [place]    Given by: Army Fossa CMA    Exp. Date: 02/10/2011    Lot #: [id number]

## 2010-06-24 NOTE — Letter (Signed)
Summary: Handicapped Placard/[facility]  Handicapped Placard/[facility]   Imported By: Lanelle Bal 04/10/2010 09:51:50  _____________________________________________________________________  External Attachment:    Type:   Image     Comment:   External Document

## 2010-06-24 NOTE — Letter (Signed)
Summary: CMN for Oxygen/[facility]  CMN for Oxygen/[facility]   Imported By: Lanelle Bal 04/29/2010 14:12:04  _____________________________________________________________________  External Attachment:    Type:   Image     Comment:   External Document

## 2010-06-24 NOTE — Assessment & Plan Note (Signed)
Summary: consult for emphysema   Copy to:  Ruthann Cancer at [facility] Primary Provider/Referring Provider:  Nolon Rod. Paz MD  CC:  Pulmonary Consult.  History of Present Illness: The pt is a very pleasant 60y/o female who I have been asked to see for local management of emphysema.  She has known severe emphysema with chronic oxygen dependent respiratory failure, but functions at a much higher baseline than expected given her disease.  She has been followed most recent at [facility] for possible lung transplant, but now is not felt to be a candidate due to underlying cerebrovascular disease.  She has participated in a formal rehab program, and currently is exercising at a fitness center daily.  She walks a mile a day on a track with oxygen, and every other day ADDS one mile on a treadmill.  She currently denies any cough, congestion, or mucus.  She has had no LE edema.  She has been maintained successfully on advair and spiriva, with rescue mdi and nebulizer.  She wears oxygen pulsed at 2 lpm with exertion, and continuous at home with concentrator.  She is eating well, and has not been losing significant weight.    Current Medications (verified): 1)  Spiriva Handihaler 18 Mcg  Caps (Tiotropium Bromide Monohydrate) .... Inhale Contents of 1 Capsule Once A Day 2)  Advair Diskus 250-50 Mcg/dose  Misc (Fluticasone-Salmeterol) .... One Puff Two Times A Day 3)  Plavix 75 Mg  Tabs (Clopidogrel Bisulfate) .... Take One Tablet Daily 4)  Ventolin Hfa 108 (90 Base) Mcg/act  Aers (Albuterol Sulfate) .Marland Kitchen.. 1-2 Puffs Every 4-6 Hours As Needed 5)  Duoneb 0.5-2.5 (3) Mg/76ml Soln (Ipratropium-Albuterol) .... As Needed 6)  Oxygen .... 2 Lpm Pulsed 24/7  Allergies (verified): No Known Drug Allergies  Past History:  Past Medical History: h/o PUD, s/p partial gastrectomy 1985.   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 Family History Diabetes 1st degree relative Family History Hypertension Family Histoy Stroke Family History Breast Cancer Family History Colon Cancer heart disease: father (m.i.)   Social History: Reviewed history from 03/27/2009 and no changes required. one living child Single lives by self tobacco-- quit 2007.  started at age 20.  1 ppd.  ETOH-- rarely  on disability.  previously worked in Clinical biochemist.  Review of Systems       The patient complains of shortness of breath with activity.  The patient denies shortness of breath at rest, productive cough, non-productive cough, coughing up blood, chest pain, irregular heartbeats, acid heartburn, indigestion, loss of appetite, weight change, abdominal pain, difficulty swallowing, sore throat, tooth/dental problems, headaches, nasal congestion/difficulty breathing through nose, sneezing, itching, ear ache, anxiety, depression, hand/feet swelling, joint stiffness or pain, rash, change in color of mucus, and fever.    Vital Signs:  Patient profile:   60 year old female Height:      63 inches Weight:      135.13 pounds BMI:     24.02 O2 Sat:      94 % on 2 LPM pulsed Temp:     97.9 degrees F oral Pulse rate:   95 / minute BP sitting:   110 / 74  (left arm) Cuff size:   regular  Vitals Entered By: Arman Filter LPN (June 14, 2009 2:44 PM)  O2 Flow:  2 LPM pulsed  CC: Pulmonary Consult Comments Medications reviewed with patient Arman Filter LPN  June 14, 2009 2:52 PM    Physical Exam  General:  wd female in nad Eyes:  PERRLA and EOMI.   Nose:  patent without discharge Mouth:  clear  Neck:  soft and nontender, bs+ Lungs:  very diminished bs throughout, no wheezing of rhonchi Heart:  rrr, no mrg Abdomen:  soft and nontender, bs+ Extremities:  no edema noted, pulses intact  distally  no cyanosis Neurologic:  alert and oriented, moves all 4.   Impression & Recommendations:  Problem # 1:  EMPHYSEMA (ICD-492.8) the pt has severe disease, but is functioning at a much higher level.  She is on a very good bronchodilator regimen, and is committed to daily exercise.  I have also stressed to her the importance of good nutrition, and maintaining muscle mass.  Will also need to keep up with vaccinations as appropriate.  Problem # 2:  CHRONIC RESPIRATORY FAILURE (ICD-518.83) the pt has adequate oxygenation on her current O2 flow.  Will continue.  Medications Added to Medication List This Visit: 1)  Spiriva Handihaler 18 Mcg Caps (Tiotropium bromide monohydrate) .... Inhale contents of 1 capsule once a day 2)  Ventolin Hfa 108 (90 Base) Mcg/act Aers (Albuterol sulfate) .Marland Kitchen.. 1-2 puffs every 4-6 hours as needed 3)  Duoneb 0.5-2.5 (3) Mg/33ml Soln (Ipratropium-albuterol) .... As needed 4)  Oxygen  .... 2 lpm pulsed 24/7  Patient Instructions: 1)  no change in current meds 2)  continue to work on exercise program 3)  followup with me in 4mos, but call if having issues.  Appended Document: Orders Update    Clinical Lists Changes  Orders: Added new Service order of Consultation Level IV [phone]) - Signed

## 2010-06-24 NOTE — Assessment & Plan Note (Signed)
Summary: rov for severe emphysema   Copy to:  [facility]) Primary Provider/Referring Provider:  Nolon Rod. Paz MD  CC:  4 month follow up. Pt states breahting is no different form last visit. pt states she ahs good and bad days. Pt states she has producitve cough with white phlem. Pt states she quit smoking  2007.Marland Kitchen  History of Present Illness: the pt comes in today for f/u of her known severe emphysema with chronic oxygen dep. respiratory failure.  She states that her breathing is near her usual baseline, and she denies any recent acute exacerbations or chest infections.  She has a mild cough with white mucus at times, but is not congested.  She is still using advair only one time a day in order to help with medication expense.    Current Medications (verified): 1)  Spiriva Handihaler 18 Mcg  Caps (Tiotropium Bromide Monohydrate) .... Inhale Contents of 1 Capsule Once A Day 2)  Advair Diskus 250-50 Mcg/dose  Misc (Fluticasone-Salmeterol) .... Inhale 1 Puff Once Daily 3)  Plavix 75 Mg  Tabs (Clopidogrel Bisulfate) .... Take One Tablet Daily 4)  Ventolin Hfa 108 (90 Base) Mcg/act  Aers (Albuterol Sulfate) .Marland Kitchen.. 1-2 Puffs Every 4-6 Hours As Needed 5)  Duoneb 0.5-2.5 (3) Mg/57ml Soln (Ipratropium-Albuterol) .... As Needed 6)  Oxygen .... 2 Lpm Pulsed 24/7 7)  Simvastatin 20 Mg Tabs (Simvastatin) .Marland Kitchen.. 1 By Mouth At Bedtime 8)  Alendronate Sodium 70 Mg Tabs (Alendronate Sodium) .Marland Kitchen.. 1 By Mouth Qwk - Take On Empty Stomach With Full Glass of Water, Remain Upright For After Taking 9)  Neomycin-Polymyxin-Hc 3.5-10000-1 Susp (Neomycin-Polymyxin-Hc) .... 4 Drops in Affected Ear Three Times A Day. 10)  Amoxicillin 500 Mg Tabs (Amoxicillin) .Marland Kitchen.. 1 Tab By Mouth Two Times A Day 10 Days  Allergies (verified): No Known Drug Allergies  Review of Systems       The patient complains of shortness of breath with activity, productive cough, nasal congestion/difficulty breathing through nose, and joint stiffness  or pain.  The patient denies shortness of breath at rest, non-productive cough, coughing up blood, chest pain, irregular heartbeats, acid heartburn, indigestion, loss of appetite, weight change, abdominal pain, difficulty swallowing, sore throat, tooth/dental problems, headaches, sneezing, itching, ear ache, anxiety, depression, hand/feet swelling, rash, change in color of mucus, and fever.    Vital Signs:  Patient profile:   60 year old female Height:      63 inches Weight:      131.50 pounds BMI:     23.38 O2 Sat:      92 % on 2 L/min Temp:     98.2 degrees F oral Pulse rate:   101 / minute BP sitting:   122 / 74  (left arm) Cuff size:   regular  Vitals Entered By: Carver Fila (February 12, 2010 1:50 PM)  O2 Flow:  2 L/min CC: 4 month follow up. Pt states breahting is no different form last visit. pt states she ahs good and bad days. Pt states she has producitve cough with white phlem. Pt states she quit smoking  2007. Comments MEDS AND ALLERGIES UPDATED Phone number updated Carver Fila  February 12, 2010 1:51 PM    Physical Exam  General:  wd female in nad Nose:  no purulence or drainage noted. Lungs:  very decreased bs throughout, no wheezing or rhonchi Heart:  rrr, no mrg Extremities:  no edema or cyanosis  Neurologic:  alert and oriented, moves all 4   Impression &  Recommendations:  Problem # 1:  EMPHYSEMA (ICD-492.8)  the pt is doing as well as can be expected.  She is walking a mile a day, has had no recent pulmonary infections, and no recent acute exacerbations.  I have encouraged her to continue with her exercise program, and to followup with me in 4 mos.  I have also given her samples of advair, and asked her to try and use twice a day if possible.  Problem # 2:  CHRONIC RESPIRATORY FAILURE (ICD-518.83) she has adequate sats on her current supplemental oxygen.    Other Orders: Est. Patient Level III (16109)  Patient Instructions: 1)  no change in meds 2)   stay on exercise program with oxygen 3)  try and take advair twice a day as much as you can. 4)  followup with me in 4mos.   Prescriptions: VENTOLIN HFA 108 (90 BASE) MCG/ACT  AERS (ALBUTEROL SULFATE) 1-2 puffs every 4-6 hours as needed  #1 x 6   Entered and Authorized by:   Barbaraann Share MD   Signed by:   Barbaraann Share MD on 02/12/2010   Method used:   Print then Give to Patient   RxID:   [id number]

## 2010-06-24 NOTE — Letter (Signed)
Summary: Transplant Return Visit/[facility]  Transplant Return Visit/[facility]   Imported By: Sherian Rein 07/15/2009 09:03:19  _____________________________________________________________________  External Attachment:    Type:   Image     Comment:   External Document

## 2010-06-24 NOTE — Progress Notes (Signed)
Summary: med too expensive  Phone Note Call from Patient   Summary of Call: patient wants different ear drop called in [facility] -the others was too expensive - [facility] [place] - Initial call taken by: Okey Regal [place],  February 10, 2010 1:16 PM  Follow-up for Phone Call        I received message from patient pharmacy that this med is $98. Please advise. Lucious Groves CMA  February 10, 2010 1:29 PM   Additional Follow-up for Phone Call Additional follow up Details #1::        there are no cheaper ear drops that treat both so will treat outer ear infxn w/ drops and middle ear infxn w/ amoxicillin.  please explain this to pt.  both meds are now generic. Additional Follow-up by: Neena Rhymes MD,  February 10, 2010 1:46 PM    Additional Follow-up for Phone Call Additional follow up Details #2::    left message to call back to office. Lucious Groves CMA  February 10, 2010 2:05 PM   Left message on machine to call back to office. Lucious Groves CMA  February 10, 2010 4:36 PM   No return call from patient, left message at home to call back to office. Tried work #, patient notified. Lucious Groves CMA  February 11, 2010 11:21 AM   New/Updated Medications: NEOMYCIN-POLYMYXIN-HC 3.5-10000-1 SUSP (NEOMYCIN-POLYMYXIN-HC) 4 drops in affected ear three times a day. AMOXICILLIN 500 MG TABS (AMOXICILLIN) 1 tab by mouth two times a day 10 days Prescriptions: AMOXICILLIN 500 MG TABS (AMOXICILLIN) 1 tab by mouth two times a day 10 days  #20 x 0   Entered and Authorized by:   Neena Rhymes MD   Signed by:   Neena Rhymes MD on 02/10/2010   Method used:   Electronically to        [facility] [phone]* (retail)       [address]       [place], [place]  [address]       Ph: [phone]       Fax: [phone]   RxID:   [id number] NEOMYCIN-POLYMYXIN-HC 3.5-10000-1 SUSP (NEOMYCIN-POLYMYXIN-HC) 4 drops in affected ear three times a day.  #7.5 ml x 0   Entered and Authorized by:   Neena Rhymes  MD   Signed by:   Neena Rhymes MD on 02/10/2010   Method used:   Electronically to        [facility] [phone]* (retail)       [address]       [place], [place]  [address]       Ph: [phone]       Fax: [phone]   RxID:   [id number]

## 2010-06-26 NOTE — Progress Notes (Signed)
Summary: Alenfronate refill  Phone Note Refill Request Message from:  Fax from Pharmacy on May 21, 2010 3:24 PM  Refills Requested: Medication #1:  ALENDRONATE SODIUM 70 MG TABS 1 by mouth qwk - take on empty stomach with full glass of water   Last Refilled: 04/15/2010 [facility],    [address], [place], [place]  phone-[phone]    fax-[phone]   qty - 4  Next Appointment Scheduled: Fri 3/30   Paz Initial call taken by: Jerolyn Shin,  May 21, 2010 3:25 PM    Prescriptions: ALENDRONATE SODIUM 70 MG TABS (ALENDRONATE SODIUM) 1 by mouth qwk - take on empty stomach with full glass of water, remain upright for after taking  #4 Each x 3   Entered by:   Army Fossa CMA   Authorized by:   Nolon Rod. Paz MD   Signed by:   Army Fossa CMA on 05/21/2010   Method used:   Electronically to        [facility] [phone]* (retail)       [address]       [place], [place]  [address]       Ph: [phone]       Fax: [phone]   RxID:   [id number]

## 2010-06-26 NOTE — Assessment & Plan Note (Signed)
Summary: rov for emphysema   Copy to:  [facility]) Primary Provider/Referring Provider:  Nolon Rod. Paz MD  CC:  4 MONTH FOLLOW UP. pt c/o increased sob this week due to the weather, cough w/ clear phlem, scratchy throat, chest congestion. pt denies any wheezing, and chest tightness. Marland Kitchen  History of Present Illness: The pt comes in today for f/u of her known severe emphysema with chronic respiratory failure.  She is wearing her oxygen compliantly, and is doing well with her meds.  She is still walking one mile almost everyday.  She denies any recent chest infection or acute exacerbation.  She has had a little more difficulty with the very cold weather, but is managing.  Current Medications (verified): 1)  Spiriva Handihaler 18 Mcg  Caps (Tiotropium Bromide Monohydrate) .... Inhale Contents of 1 Capsule Once A Day 2)  Advair Diskus 250-50 Mcg/dose  Misc (Fluticasone-Salmeterol) .... Inhale 1 Puff Once Daily 3)  Plavix 75 Mg  Tabs (Clopidogrel Bisulfate) .... Take One Tablet Daily 4)  Ventolin Hfa 108 (90 Base) Mcg/act  Aers (Albuterol Sulfate) .Marland Kitchen.. 1-2 Puffs Every 4-6 Hours As Needed 5)  Duoneb 0.5-2.5 (3) Mg/56ml Soln (Ipratropium-Albuterol) .... As Needed 6)  Oxygen .Marland Kitchen.. 2-3 Lpm Pulsed 24/7 7)  Zocor 40 Mg Tabs (Simvastatin) .Marland Kitchen.. 1 By Mouth At Bedtime. 8)  Alendronate Sodium 70 Mg Tabs (Alendronate Sodium) .Marland Kitchen.. 1 By Mouth Qwk - Take On Empty Stomach With Full Glass of Water, Remain Upright For After Taking 9)  Ca and Vit D Otc  Allergies (verified): No Known Drug Allergies  Review of Systems       The patient complains of shortness of breath with activity, productive cough, sore throat, and nasal congestion/difficulty breathing through nose.  The patient denies shortness of breath at rest, non-productive cough, coughing up blood, chest pain, irregular heartbeats, acid heartburn, indigestion, loss of appetite, weight change, abdominal pain, difficulty swallowing, tooth/dental problems,  headaches, sneezing, itching, ear ache, anxiety, depression, hand/feet swelling, joint stiffness or pain, rash, change in color of mucus, and fever.    Vital Signs:  Patient profile:   60 year old female Height:      63 inches Weight:      131.50 pounds BMI:     23.38 O2 Sat:      99 % on 2 L/min pulsed Temp:     97.7 degrees F oral Pulse rate:   93 / minute BP sitting:   118 / 64  (left arm) Cuff size:   regular  Vitals Entered By: Carver Fila (May 28, 2010 2:12 PM)  O2 Flow:  2 L/min pulsed CC: 4 MONTH FOLLOW UP. pt c/o increased sob this week due to the weather, cough w/ clear phlem, scratchy throat, chest congestion. pt denies any wheezing, chest tightness.  Comments meds and allergies updated Phone number updated Carver Fila  May 28, 2010 2:13 PM    Physical Exam  General:  wd female in nad Lungs:  decreased bs throughout, no wheezing or rhonchi Heart:  rrr Extremities:  no edema or cyanosis Neurologic:  alert and oriented, moves all 4.   Impression & Recommendations:  Problem # 1:  EMPHYSEMA (ICD-492.8) the pt is maintaining a stable baseline on her current regimen.  She is having a little more difficulty breathing the last few days due to the cold weather, but has not had a recent acute exacerbation or chest infection.  She continues to stay very active.  I have  asked her to stay on her current regimen, and to call if having worsening symptoms.  Problem # 2:  CHRONIC RESPIRATORY FAILURE (ICD-518.83) the pt has adequate sats on her current oxygen flow.    Medications Added to Medication List This Visit: 1)  Oxygen  .Marland Kitchen.. 2-3 lpm pulsed 24/7  Other Orders: Est. Patient Level III ([address])  Patient Instructions: 1)  no change in meds. 2)  stay as active as possible. 3)  let us know if your breathing worsens or if developing a chest cold. 4)  followup with me in 4mos.    Prescriptions: DUONEB 0.5-2.5 (3) MG/3ML SOLN (IPRATROPIUM-ALBUTEROL) as needed  #one  box x 6   Entered and Authorized by:   Barbaraann Share MD   Signed by:   Barbaraann Share MD on 05/28/2010   Method used:   Print then Give to Patient   RxID:   [id number] SPIRIVA HANDIHALER 18 MCG  CAPS (TIOTROPIUM BROMIDE MONOHYDRATE) Inhale contents of 1 capsule once a day  #30 x 6   Entered and Authorized by:   Barbaraann Share MD   Signed by:   Barbaraann Share MD on 05/28/2010   Method used:   Print then Give to Patient   RxID:   [id number]

## 2010-06-26 NOTE — Letter (Signed)
Summary: CMN for Oxygen/[facility]  CMN for Oxygen/[employer]   Imported By: Lanelle Bal 05/16/2010 14:06:24  _____________________________________________________________________  External Attachment:    Type:   Image     Comment:   External Document

## 2010-06-27 ENCOUNTER — Encounter: Payer: Self-pay | Admitting: Internal Medicine

## 2010-07-02 NOTE — Letter (Signed)
Summary: CMN Oxygen/[facility]  CMN Oxygen/[facility]   Imported By: Lanelle Bal 06/24/2010 11:47:20  _____________________________________________________________________  External Attachment:    Type:   Image     Comment:   External Document

## 2010-07-10 NOTE — Letter (Signed)
Summary: Certificate of Medical Necessity  Certificate of Medical Necessity   Imported By: Kassie Mends 07/03/2010 10:39:22  _____________________________________________________________________  External Attachment:    Type:   Image     Comment:   External Document

## 2010-08-05 ENCOUNTER — Telehealth (INDEPENDENT_AMBULATORY_CARE_PROVIDER_SITE_OTHER): Payer: Self-pay | Admitting: *Deleted

## 2010-08-12 NOTE — Progress Notes (Signed)
Summary: needs something called in or an appt<<<Doxy Rx sent  Phone Note Call from Patient Call back at Home Phone [phone]   Caller: Patient Call For: clance Summary of Call: patient woke up this morning coughing up heavy white cloudy mucas and she stated that dr clance told her to call with any changes. He nor tammy has anything today or tomorrow she wants to know if he wants to work her in or call her something in to [facility] in [facility] and she can be reached at [phone] Initial call taken by: Vedia Coffer,  August 05, 2010 12:08 PM  Follow-up for Phone Call        Spoke with pt.  She states that she has been having cough since this am- prod with large amounts of cloudy green to white colored sputum.  She states that her chest feels tight when she coughs.  Denies wheeze, increased SOB, or fever.  No appts available for today or tommorrow.  Pls advise thanks NKDA Patient phoned stated that she was calling back because she had not heared anything and it was getting late. I advised Ms Helen Scott that they were still seeing patients and they addressed notes in between patients she stated that she was just concerned because he told her that if she ever had a problem to call immediately.Vedia Coffer  August 05, 2010 5:05 PM Follow-up by: Vernie Murders,  August 05, 2010 12:12 PM  Additional Follow-up for Phone Call Additional follow up Details #1::        ok to call in doxycycline 100mg  two times a day for 5 days. Additional Follow-up by: Barbaraann Share MD,  August 05, 2010 5:12 PM    Additional Follow-up for Phone Call Additional follow up Details #2::    Pt aware of RX and will call if her sxs do not improve or get worse.Michel Bickers CMA  August 05, 2010 5:16 PM  New/Updated Medications: DOXYCYCLINE HYCLATE 100 MG CAPS (DOXYCYCLINE HYCLATE) 1 by mouth two times a day for 5 days Prescriptions: DOXYCYCLINE HYCLATE 100 MG CAPS (DOXYCYCLINE HYCLATE) 1 by mouth two times a day for 5  days  #10 x 0   Entered by:   Michel Bickers CMA   Authorized by:   Barbaraann Share MD   Signed by:   Michel Bickers CMA on 08/05/2010   Method used:   Electronically to        [facility] [phone]* (retail)       [address]       [place], [place]  [address]       Ph: [phone]       Fax: [phone]   RxID:   [id number]

## 2010-08-22 ENCOUNTER — Ambulatory Visit (INDEPENDENT_AMBULATORY_CARE_PROVIDER_SITE_OTHER): Payer: Medicare Other | Admitting: Internal Medicine

## 2010-08-22 ENCOUNTER — Encounter: Payer: Self-pay | Admitting: Internal Medicine

## 2010-08-22 DIAGNOSIS — J449 Chronic obstructive pulmonary disease, unspecified: Secondary | ICD-10-CM

## 2010-08-22 DIAGNOSIS — E785 Hyperlipidemia, unspecified: Secondary | ICD-10-CM

## 2010-08-22 LAB — LIPID PANEL
HDL: 59.7 mg/dL (ref >39.00)
LDL Cholesterol: 88 mg/dL (ref 0–99)
Total CHOL/HDL Ratio: 3
Triglycerides: 34 mg/dL (ref 0.0–149.0)
VLDL: 6.8 mg/dL (ref 0.0–40.0)

## 2010-08-22 NOTE — Assessment & Plan Note (Signed)
Recently treated for bronchitis, better except for the last 2 days rec zyrtec , mucinex  (?allergies)

## 2010-08-22 NOTE — Assessment & Plan Note (Signed)
Tolerates well higher dose of simvastatin Walking 1 mile a day! Labs

## 2010-08-22 NOTE — Patient Instructions (Addendum)
Take zyrtec 10mg  OTC 1 a day x few days mucinex DM twice a day  X few days Call if cough persist more than 10 days Call if sx severe

## 2010-08-22 NOTE — Progress Notes (Signed)
  Subjective:    Patient ID: Helen Scott, female    DOB: July 27, 1950, 60 y.o.   MRN: [id number]  HPI Routine office visit, had a physical exam 01-2010 2 weeks ago, she had cough and chest congestion, her pulmonary doctor called  doxycycline for 5 days. She felt better, cough decreased but 2 days ago she fell that the cough was coming back again. She wonders if the cough is related to allergies. Based on the last cholesterol profile, we increased her dose of simvastatin, good medication compliance, no apparent side effects  Past Medical History  Diagnosis Date  . PUD (peptic ulcer disease)   . CVA (cerebral infarction) 06/1999  . Emphysema     FEV1 0.49, FEV1% 27 on 5/10  . Hyperlipidemia     Past Surgical History  Procedure Date  . Partial gastrectomy   . Breast biopsy   . Tubal ligation 1980s  . 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 She admits to itchy eyes, itchy nose and sneezing    Objective:   Physical Exam  Constitutional: She appears well-developed and well-nourished.  Cardiovascular: Normal rate, regular rhythm and normal heart sounds.   Pulmonary/Chest: Effort normal.       Decreased  breath sounds throughout, no wheezing or crackles. No increased work of breathing at rest  Musculoskeletal: She exhibits no edema.          Assessment & Plan:

## 2010-08-26 ENCOUNTER — Telehealth: Payer: Self-pay | Admitting: *Deleted

## 2010-08-26 NOTE — Telephone Encounter (Signed)
Spoke w/ pt aware of results says she has been watching diet and exercising and would like to know if he could decrease dose of simvastatin from 40 mg to 20mg .

## 2010-08-26 NOTE — Telephone Encounter (Signed)
Her cholesterol is just right. I recommend to stay on the same medicines and reassess in 6 months, consider decreased medications then

## 2010-08-26 NOTE — Telephone Encounter (Signed)
I spoke w/ pt she is aware.  

## 2010-08-26 NOTE — Telephone Encounter (Signed)
Notes Recorded by Wanda Plump, MD on 08/25/2010 at 2:41 PM Advise patient, excellent response to simvastatin, continue with same meds. cholesterol well-controlled !      Message left for patient to return my call.

## 2010-08-27 ENCOUNTER — Other Ambulatory Visit: Payer: Self-pay | Admitting: Internal Medicine

## 2010-09-16 ENCOUNTER — Telehealth: Payer: Self-pay | Admitting: Pulmonary Disease

## 2010-09-16 NOTE — Telephone Encounter (Signed)
Pt states she would like for [facility] to dictate a letter stating that her mail needs to be delivered to her doorstep and not left at the road. She states it is very hard for her to walk to her mailbox some days and the postmaster said that if she had a letter from her physician they could change the drop off point for her mail. Letter needs to be mailed to:  [address], [address], [place] [address], ATTN: Dietitian. Pls advise.

## 2010-09-17 NOTE — Telephone Encounter (Signed)
LMOMTCB

## 2010-09-17 NOTE — Telephone Encounter (Signed)
Let her know that I will write her a note.  Will leave in triage box.

## 2010-09-18 NOTE — Telephone Encounter (Signed)
Left a detailed msg to let the pt know [facility] did write the letter and that we were mailing it to the address she provided for [place]. No callback needed unless she has any questions. Will sign off on this msg.

## 2010-09-18 NOTE — Telephone Encounter (Signed)
Pt returning call.Helen Scott ° °

## 2010-09-19 ENCOUNTER — Telehealth: Payer: Self-pay | Admitting: *Deleted

## 2010-09-19 MED ORDER — IPRATROPIUM-ALBUTEROL 0.5-2.5 (3) MG/3ML IN SOLN: 3.0000 mL | Freq: Four times a day (QID) | RESPIRATORY_TRACT | Status: DC | PRN

## 2010-09-19 NOTE — Telephone Encounter (Signed)
Per [facility], pt's Duoneb needs to be filed under MCR PArt B. I tried to contact the pt to find out which pharmacy she fills this medication at so we can call them. Her cell phone wasn't getting good service and she said she would call me back.

## 2010-09-19 NOTE — Telephone Encounter (Signed)
Pt returned call.  She states she uses [facility] in [place] - this pharm is in pt's chart.  States they do have her part b info.  Aware we will call them to advise to fill duoneb under part b.  She verbalized understanding of this.

## 2010-09-19 NOTE — Telephone Encounter (Signed)
New prescription with diagnosis and instructions to file with MCR Part B sent to the pharmacy.

## 2010-09-22 ENCOUNTER — Encounter: Payer: Self-pay | Admitting: Pulmonary Disease

## 2010-09-23 ENCOUNTER — Other Ambulatory Visit: Payer: Self-pay | Admitting: Internal Medicine

## 2010-09-24 ENCOUNTER — Ambulatory Visit (INDEPENDENT_AMBULATORY_CARE_PROVIDER_SITE_OTHER): Payer: Medicare Other | Admitting: Pulmonary Disease

## 2010-09-24 ENCOUNTER — Encounter: Payer: Self-pay | Admitting: Pulmonary Disease

## 2010-09-24 VITALS — BP 112/60 | HR 109 | Temp 98.2°F | Ht 63.0 in | Wt 129.6 lb

## 2010-09-24 DIAGNOSIS — J449 Chronic obstructive pulmonary disease, unspecified: Secondary | ICD-10-CM

## 2010-09-24 NOTE — Progress Notes (Signed)
  Subjective:    Patient ID: Helen Scott, female    DOB: 12/19/1950, 60 y.o.   MRN: [id number]  HPI The pt comes in today for f/u of her known emphysema with chronic respiratory failure.  She has been doing well overall, but most recently has had a worsening cough with mild chest congestion, but no worsening sob.  Mucus has been scant, but white.  She has been staying active, and denies wheezing. Tried otc antihistamine without improvement.   Review of Systems  Constitutional: Negative for fever and unexpected weight change.  HENT: Negative for ear pain, nosebleeds, congestion, sore throat, rhinorrhea, sneezing, trouble swallowing, dental problem, postnasal drip and sinus pressure.   Eyes: Negative for redness and itching.  Respiratory: Positive for cough, chest tightness, shortness of breath and wheezing.   Cardiovascular: Negative for palpitations and leg swelling.  Gastrointestinal: Negative for nausea and vomiting.  Genitourinary: Negative for dysuria.  Musculoskeletal: Negative for joint swelling.  Skin: Negative for rash.  Neurological: Negative for headaches.  Hematological: Bruises/bleeds easily.  Psychiatric/Behavioral: Negative for dysphoric mood. The patient is not nervous/anxious.        Objective:   Physical Exam Wd female in nad Chest with decreased bs, no wheezing Cor with rrr LE without edema, no cyanosis  Alert, oriented, moves all 4        Assessment & Plan:

## 2010-09-24 NOTE — Patient Instructions (Signed)
No change in maintenance meds Try mucinex dm extra strength one in am and pm for a few weeks to see if helps.  Drink plenty of water with it. If doing well, followup with me in 4-6 mos.  If not improving, or certainly if worsening, please call me.

## 2010-09-24 NOTE — Assessment & Plan Note (Addendum)
The pt is doing well from a breathing standpoint, but is having a little more cough with small quantities of white mucus.  It is unclear if this is a developing acute bronchitis, or is this more due to allergies/pnd?  I would like to treat her conservatively with a trial of mucinex first, and see how she does.  She is to call if having worsening sx, and can treat for acute bronchitis at that time.

## 2010-10-10 NOTE — Discharge Summary (Signed)
[facility]  Patient:    Helen Scott, Helen Scott                       MRN: [id number] Adm. Date:  [id number] Disc. Date: [id number] Attending:  Enos Fling CC:         Gerrit Friends. Dietrich Pates, M.D. [facility]             [facility]                           Discharge Summary  ADMISSION DIAGNOSIS: 1.  New onset of right brain stroke. 2.  History of tobacco abuse.  DISCHARGE DIAGNOSIS: 1.  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 2.  History of tobacco abuse. 3.  Microcytic anemia.  PROCEDURES: 1.  MRI scan of the brain. 2.  MRI angiogram. 3.  2-D echocardiogram. 4.  Carotid Doppler study.  COMPLICATIONS FROM PROCEDURES:  None.  HISTORY OF PRESENT ILLNESS:  Helen Scott is a 61 year old white female born n [birth year].  The patient is right handed.  The patient has been in good health prior to this admission but awoke the morning of admission with some sensation f weakness on the left side including the hand, with difficulty grasping objects ith the left hand.  The patient had some pressure sensations on the upper left chest, radiation numbness in the left arm.  The patient had sensation of this for about 15 minutes and was brought to the emergency room for evaluation.  The patient had o change in vision or speech and no history of palpitations.  The patient denied ny headache.  The patient was found to have evidence of right parietal stroke by head scan and was admitted for evaluation.  PAST MEDICAL HISTORY: 1.  History of bleeding peptic ulcer in 1985 with partial gastrectomy. 2.  History of new onset embolic stroke, right brain, as above. 3.  History of microcytic anemia.  MEDICATIONS PRIOR TO ADMISSION:  None.  ALLERGIES:  No known drug allergies.  SOCIAL HISTORY:  Smoking one-half to one pack of cigarettes a day.  Has occasional alcohol.  The patient had also been taking a diet pill for 1-1/2 years prior  to  this admission.  Please refer to the History and Physical for details of the patients Social History, Family History, Review Of Systems, and physical examination.  LABORATORY DATA:  Notable for homocystine level of 11, which is normal.  B-12 level 311.  Iron level 29, which is low; per cent saturation 7, which is low; folate 8.1. WBC 7.8, hemoglobin 11.2, hematocrit 35.9; platelets 317,000. Sedimentation rate 13.  Coags were unremarkable.  Protein S and protein C levels are pending t the time of this dictation.  Troponin I level less than 0.03.  Urinalysis showed specific gravity of 1.010, pH 7.0.  ANA is pending.  Anticardiolipin antibody panel is pending at this time.  EKG reveals normal sinus rhythm, normal EKG; heart rate 88.  HOSPITAL COURSE:  The patient has done well during the course of hospitalization. The patient was admitted for evaluation of the above problem.  This patient underwent a carotid Doppler study that shows no evidence of significant internal carotid artery stenosis, antegrade vertebral artery flow noted.  The patient was thought to have had an embolic stroke and for that reason had 2-D echocardiogram that was unremarkable.  The patient also underwent an MRI of the brain and  MRI angiogram.  MRI of the brain confirmed a right parietal stroke.  The patient has minimal intracranial atherosclerosis on the MRI angiogram.  The patient did have one brief episode of ventricular tachycardia during this admission on the monitor and was seen by cardiology in consultation for that reason.  No further work-up was felt to be needed by cardiology other than the 2-D echocardiogram, which was normal.  The patient will be followed up following discharge.  The patient was placed on aspirin on admission and was kept on aspirin and due to low iron levels iron supplementation was added as well.  DISPOSITION:  On discharge the patient is on one aspirin daily and is  to take multivitamins with iron, and is to follow up with [facility] in two to three weeks following discharge, and is to follow up with cardiology with Dr. Dietrich Pates following discharge.  The patient may require cerebral angiogram following discharge.  This can be done as an outpatient.  At the time of discharge the patient was alert and cooperative, ambulatory, minimal left upper extremity  drift noted, full visual fields seen, speech normal, the patient swallowing normally.  The patient is to stop smoking and is not to go back on diet pills. The patient had been on metabolite for about 1-1/2 years prior to this evaluation. ANA has returned and is 1:40. DD:  07/12/99 TD:  07/13/99 Job: [id number]

## 2010-10-21 ENCOUNTER — Other Ambulatory Visit: Payer: Self-pay | Admitting: Pulmonary Disease

## 2010-11-24 ENCOUNTER — Other Ambulatory Visit: Payer: Self-pay | Admitting: Internal Medicine

## 2010-11-24 MED ORDER — CLOPIDOGREL BISULFATE 75 MG PO TABS: 75.0000 mg | ORAL_TABLET | Freq: Every day | ORAL | Status: DC

## 2010-12-26 ENCOUNTER — Other Ambulatory Visit: Payer: Self-pay | Admitting: Pulmonary Disease

## 2010-12-30 NOTE — Telephone Encounter (Signed)
Medication refilled electronically for the pt --pt aware this has been done

## 2011-01-28 ENCOUNTER — Encounter: Payer: Self-pay | Admitting: Pulmonary Disease

## 2011-01-28 ENCOUNTER — Ambulatory Visit (INDEPENDENT_AMBULATORY_CARE_PROVIDER_SITE_OTHER): Payer: Medicare Other | Admitting: Pulmonary Disease

## 2011-01-28 ENCOUNTER — Ambulatory Visit (INDEPENDENT_AMBULATORY_CARE_PROVIDER_SITE_OTHER)
Admission: RE | Admit: 2011-01-28 | Discharge: 2011-01-28 | Disposition: A | Discharge status: Home / Self Care | Payer: Medicare Other | Source: Ambulatory Visit | Attending: Pulmonary Disease | Admitting: Pulmonary Disease

## 2011-01-28 VITALS — BP 102/64 | HR 101 | Temp 98.2°F | Ht 63.0 in | Wt 130.4 lb

## 2011-01-28 DIAGNOSIS — J9610 Chronic respiratory failure, unspecified whether with hypoxia or hypercapnia: Secondary | ICD-10-CM

## 2011-01-28 DIAGNOSIS — J4489 Other specified chronic obstructive pulmonary disease: Secondary | ICD-10-CM

## 2011-01-28 DIAGNOSIS — R059 Cough, unspecified: Secondary | ICD-10-CM

## 2011-01-28 DIAGNOSIS — R05 Cough: Secondary | ICD-10-CM

## 2011-01-28 DIAGNOSIS — J449 Chronic obstructive pulmonary disease, unspecified: Secondary | ICD-10-CM

## 2011-01-28 DIAGNOSIS — Z23 Encounter for immunization: Secondary | ICD-10-CM

## 2011-01-28 NOTE — Assessment & Plan Note (Signed)
The patient is doing very well from an emphysema standpoint, it is remaining very active.  She does still have a cough, but it is unclear how much of this is upper versus lower airway.  She is really not producing mucus, but I do hear a rattling in her chest when she coughs during the visit today.  I would like to try her on a metered dose inhaler instead they tried to have her, to see if the Advair is irritating her upper airway.  I would also like her to try an antihistamine at bedtime in the event postnasal drip is contributing to this.  Finally, I would like to check a chest x-ray to make sure there is not an acute process causing this.  I have asked her to let us know if the cough does not resolve.

## 2011-01-28 NOTE — Progress Notes (Signed)
  Subjective:    Patient ID: Helen Scott, female    DOB: 04-22-1951, 60 y.o.   MRN: [id number]  HPI The patient comes in today for followup of her known severe emphysema with chronic respiratory failure.  She is doing fairly well overall, and is maintaining her excellent functional status.  She is walking 1 mile every day, and denies an acute exacerbation or pulmonary infection since the last visit.  She is still having a cough however, but is not producing mucus.  The cough is dry and upper airway at times, but is associated with rattling mucus as well.  She denies any postnasal drip or reflux symptoms.  She has not had a chest x-ray in 18 months.   Review of Systems  Constitutional: Negative for fever and unexpected weight change.  HENT: Positive for nosebleeds and sneezing. Negative for ear pain, congestion, sore throat, rhinorrhea, trouble swallowing, dental problem, postnasal drip and sinus pressure.   Eyes: Negative for redness and itching.  Respiratory: Positive for cough and shortness of breath. Negative for chest tightness and wheezing.   Cardiovascular: Negative for palpitations and leg swelling.  Gastrointestinal: Negative for nausea and vomiting.  Genitourinary: Negative for dysuria.  Musculoskeletal: Negative for joint swelling.  Skin: Negative for rash.  Neurological: Negative for headaches.  Hematological: Bruises/bleeds easily.  Psychiatric/Behavioral: Negative for dysphoric mood. The patient is not nervous/anxious.        Objective:   Physical Exam Well-developed female in no acute distress Nose without purulence or discharge Chest with decreased breath sounds throughout, but no wheezes or rhonchi Cardiac exam with regular rate and rhythm Extremities without edema, no cyanosis noted Alert and oriented, moves all 4 extremities.       Assessment & Plan:

## 2011-01-28 NOTE — Progress Notes (Signed)
Addended by: Fenton Foy on: 01/28/2011 02:52 PM   Modules accepted: Orders

## 2011-01-28 NOTE — Patient Instructions (Signed)
Will try dulera 100/5 2 inhalations am and pm for next 4 weeks to see if cough gets better.  Let us know.  Get chlorpheniramine over the counter, and take 8mg  at bedtime for a few weeks to see if cough improves. Continue on your exercise program.  Will give you a flu shot today Will check cxr today, and call you with results.  If doing well, followup with me in 6mos, but please call if your cough is not going away.

## 2011-02-20 ENCOUNTER — Ambulatory Visit (INDEPENDENT_AMBULATORY_CARE_PROVIDER_SITE_OTHER): Payer: Medicare Other | Admitting: Internal Medicine

## 2011-02-20 ENCOUNTER — Encounter: Payer: Self-pay | Admitting: Internal Medicine

## 2011-02-20 DIAGNOSIS — M949 Disorder of cartilage, unspecified: Secondary | ICD-10-CM

## 2011-02-20 DIAGNOSIS — J449 Chronic obstructive pulmonary disease, unspecified: Secondary | ICD-10-CM

## 2011-02-20 DIAGNOSIS — Z0000 Encounter for general adult medical examination without abnormal findings: Secondary | ICD-10-CM

## 2011-02-20 DIAGNOSIS — I6350 Cerebral infarction due to unspecified occlusion or stenosis of unspecified cerebral artery: Secondary | ICD-10-CM

## 2011-02-20 DIAGNOSIS — Z136 Encounter for screening for cardiovascular disorders: Secondary | ICD-10-CM

## 2011-02-20 DIAGNOSIS — E785 Hyperlipidemia, unspecified: Secondary | ICD-10-CM

## 2011-02-20 DIAGNOSIS — M899 Disorder of bone, unspecified: Secondary | ICD-10-CM

## 2011-02-20 LAB — COMPREHENSIVE METABOLIC PANEL
AST: 24 U/L (ref 0–37)
Albumin: 4.3 g/dL (ref 3.5–5.2)
Alkaline Phosphatase: 53 U/L (ref 39–117)
Potassium: 5.5 mEq/L — ABNORMAL HIGH (ref 3.5–5.1)
Sodium: 142 mEq/L (ref 135–145)
Total Bilirubin: 0.2 mg/dL — ABNORMAL LOW (ref 0.3–1.2)
Total Protein: 7.9 g/dL (ref 6.0–8.3)

## 2011-02-20 LAB — LIPID PANEL
LDL Cholesterol: 87 mg/dL (ref 0–99)
Total CHOL/HDL Ratio: 2
VLDL: 8.6 mg/dL (ref 0.0–40.0)

## 2011-02-20 LAB — CBC WITH DIFFERENTIAL/PLATELET
Basophils Absolute: 0 10*3/uL (ref 0.0–0.1)
Basophils Relative: 0.3 % (ref 0.0–3.0)
Eosinophils Absolute: 0.2 10*3/uL (ref 0.0–0.7)
Hemoglobin: 14.6 g/dL (ref 12.0–15.0)
Lymphocytes Relative: 24.3 % (ref 12.0–46.0)
MCHC: 32.3 g/dL (ref 30.0–36.0)
Monocytes Relative: 6.8 % (ref 3.0–12.0)
Neutro Abs: 6.1 10*3/uL (ref 1.4–7.7)
Neutrophils Relative %: 66.6 % (ref 43.0–77.0)
RBC: 5.03 Mil/uL (ref 3.87–5.11)
RDW: 14.6 % (ref 11.5–14.6)

## 2011-02-20 NOTE — Assessment & Plan Note (Addendum)
T score -2.0 on 01-2009 sister has osteopenia, patient herself had a broken arm in the 80s. on Ca , Vit D started Fosamax 5-11, good medication compliance , no s/e  Last vit d level great Has a dexa scheduled for next week

## 2011-02-20 NOTE — Assessment & Plan Note (Addendum)
Seems to be doing well , EKG for baseline ---> NSR, no acute changes

## 2011-02-20 NOTE — Assessment & Plan Note (Addendum)
chart reviewed Td 2008, pneumonia shot 01-2009, shingles shot 2011, had a  flu shot already   [facility] schedule for next week PAP per patient done yesterday  cscope   done @ [facility]  9- 2010, neg, next 10 years   diet and exercise discussed

## 2011-02-20 NOTE — Progress Notes (Signed)
  Subjective:    Patient ID: Helen Scott, female    DOB: [birth year], 60 y.o.   MRN: [id number]  HPI Here for Medicare AWV: 1. Risk factors based on Past M, S, F history: reviewed 2. Physical Activities: walks 1 mile a day, does weights, exercises 3 times a week 3. Depression/mood: No problems noted or reported 4. Hearing:  No problems noted or reported  5. ADL's:  Independent, still drives  6. Fall Risk: no recent falls, average risk, counseled 7. home Safety: does feel safe at home  8. Height, weight, &visual acuity: see VS, vision normal, uses glasses to drive  9. Counseling: provided 10. Labs ordered based on risk factors: if needed  11. Referral Coordination: if needed 12.  Care Plan, see assessment and plan  13.   Cognitive Assessment: Cognition and motor skills intact  In addition, today we discussed the following: Osteopenia-- to have a DEXA next week COPD-- changed from advair to dulera, sx stable, using O2, had a flu shot Hyperlipidemia-- good med compliance   Past Medical History  Diagnosis Date  . PUD (peptic ulcer disease)      s/p partial gastrectomy 1985.    Marland Kitchen CVA (cerebral infarction) 06/1999  . Emphysema     FEV1 0.49, FEV1% 27 on 5/10  . Hyperlipidemia    Past Surgical History  Procedure Date  . Partial gastrectomy      s/p partial gastrectomy 1985.    . Breast biopsy 80s    R breast   . Tubal ligation 1980s   Family History:  Diabetes-- F (on diet, 61 y/o) Hypertension--denies  Stroke--denies  Breast Cancer-- aunt, great aunt  and 2 first cousins Colon Cancer-- denies  heart disease: father (m.i.)   Social History: one living child Single, lives by self tobacco-- quit 2007.  started at age 37.  1 ppd.  ETOH-- rarely  on disability.  previously worked in Clinical biochemist. diet--trying to eat healthy  exercise -- daily, walks a mile    Review of Systems  Constitutional: Negative for fever and fatigue.  Respiratory:       No hemoptysis    Cardiovascular: Negative for chest pain, palpitations and leg swelling.  Gastrointestinal: Negative for abdominal pain and blood in stool.  Genitourinary: Negative for dysuria, hematuria and difficulty urinating.       Objective:   Physical Exam  Constitutional: She is oriented to person, place, and time. She appears well-developed and well-nourished.  HENT:  Head: Normocephalic and atraumatic.  Neck: No thyromegaly present.       Normal carotid pulse   Cardiovascular: Normal rate, regular rhythm and normal heart sounds.   No murmur heard. Pulmonary/Chest:       Decreased BS otherwise clear  Abdominal: Soft. She exhibits no distension. There is no tenderness. There is no rebound and no guarding.  Musculoskeletal: She exhibits no edema.  Neurological: She is alert and oriented to person, place, and time.  Skin: Skin is warm and dry.  Psychiatric: She has a normal mood and affect. Her behavior is normal. Judgment and thought content normal.          Assessment & Plan:

## 2011-02-20 NOTE — Assessment & Plan Note (Signed)
Due for labs

## 2011-02-20 NOTE — Assessment & Plan Note (Signed)
Stable

## 2011-02-24 ENCOUNTER — Telehealth: Payer: Self-pay | Admitting: Pulmonary Disease

## 2011-02-24 ENCOUNTER — Other Ambulatory Visit: Payer: Self-pay | Admitting: Internal Medicine

## 2011-02-24 NOTE — Telephone Encounter (Signed)
I spoke with pt and she states she was calling to give Dr. Shelle Iron an update on her dulera. She states she really likes it and feels it has helped her. She states her cough is gone and breathing is better. She states if Dr. Shelle Iron wants her to stay on it then she will need an rx sent. Please advise Dr. Shelle Iron. Thanks  Carver Fila, CMA

## 2011-02-25 MED ORDER — MOMETASONE FURO-FORMOTEROL FUM 100-5 MCG/ACT IN AERO: 2.0000 | INHALATION_SPRAY | Freq: Two times a day (BID) | RESPIRATORY_TRACT | Status: DC

## 2011-02-25 NOTE — Telephone Encounter (Signed)
Patient last seen on 02/20/11, follow up in 1 year.  Is this refill OK?

## 2011-02-25 NOTE — Telephone Encounter (Signed)
Refill sent. Pt aware.Anum Palecek, CMA  

## 2011-02-25 NOTE — Telephone Encounter (Signed)
Ok to send in a script for [place] with 6 fills.

## 2011-02-26 ENCOUNTER — Telehealth: Payer: Self-pay | Admitting: Pulmonary Disease

## 2011-02-26 NOTE — Telephone Encounter (Signed)
Spoke with pt. She states nothing is needed now, she was actually need to speak with her PCP's nurse about labs and this has already been taken care of.

## 2011-02-26 NOTE — Telephone Encounter (Signed)
Ok all x 1 year

## 2011-02-27 ENCOUNTER — Telehealth: Payer: Self-pay | Admitting: Pulmonary Disease

## 2011-02-27 ENCOUNTER — Telehealth: Payer: Self-pay | Admitting: *Deleted

## 2011-02-27 DIAGNOSIS — E875 Hyperkalemia: Secondary | ICD-10-CM

## 2011-02-27 NOTE — Telephone Encounter (Signed)
Patient informed. Low potassium diet sheet mailed w/copy of lab results. Follow up lab scheduled and ordered.

## 2011-02-27 NOTE — Telephone Encounter (Signed)
I spoke with pt and she states her inhaler she received from the pharmacy only had 22 puffs in it. I advised she needed to contact her pharmacy bc they should have gave her enough x 1 month. Pt states she will call the pharmacy. Nothing further was needed

## 2011-03-03 ENCOUNTER — Telehealth: Payer: Self-pay | Admitting: Internal Medicine

## 2011-03-03 ENCOUNTER — Other Ambulatory Visit: Payer: Self-pay | Admitting: Internal Medicine

## 2011-03-03 MED ORDER — RISEDRONATE SODIUM 150 MG PO TABS: 150.0000 mg | ORAL_TABLET | ORAL | Status: DC

## 2011-03-03 NOTE — Telephone Encounter (Signed)
Insurance requires PA-will start process.

## 2011-03-03 NOTE — Telephone Encounter (Signed)
[place] patient, bone density test from 02/23/2011 showed a T score of -2.4 which is slightly worse than the previous T score of -2.0. Plan: Change from Fosamax to Actonel 150 mg once a month. Call in a new prescription. Continue with calcium, vitamin D and remain as active as possible Next bone density test one year.

## 2011-03-03 NOTE — Telephone Encounter (Signed)
Patient informed; just filled Fosamax, will change at end of the month. New Rx sent to pharmacy.

## 2011-03-10 ENCOUNTER — Telehealth: Payer: Self-pay | Admitting: *Deleted

## 2011-03-10 NOTE — Telephone Encounter (Signed)
Patient requesting results of Bone Density Scan & comparison as to last test. Do not see this order/test in EMR.?

## 2011-03-10 NOTE — Telephone Encounter (Signed)
Sent to [employer] for PA.

## 2011-03-10 NOTE — Telephone Encounter (Signed)
Patient needs Prior Authorization for Actonel 150mg  through [facility] [phone] Patient ID [id number]

## 2011-03-13 NOTE — Telephone Encounter (Signed)
Patient informed. 

## 2011-03-13 NOTE — Telephone Encounter (Signed)
bone density test from 02/23/2011 showed a T score of -2.4 which is slightly worse than the previous T score of -2.0.  Because the T score is slightly worse, I like her to switch from Fosamax to Actonel. If needed schedule an office visit to discuss. Initiate the process of prior authorization

## 2011-03-19 ENCOUNTER — Telehealth: Payer: Self-pay | Admitting: *Deleted

## 2011-03-19 NOTE — Telephone Encounter (Signed)
Burnard Leigh, [facility] 03/10/2011 9:07 AM Signed  Patient needs Prior Authorization for Actonel 150mg  through [facility] [phone] Patient ID [id number] Burnard Leigh, CMA 03/10/2011 9:08 AM Signed  Sent to [facility] for PA. Burnard Leigh, CMA 03/03/2011 5:33 PM Signed  Insurance requires PA-will start process   Have You Seen this PA? Patient called to inquire to status of.

## 2011-03-20 NOTE — Telephone Encounter (Signed)
Awaiting PA Form.

## 2011-03-20 NOTE — Telephone Encounter (Signed)
Patient returned Sharon's call, Jasmine December was in a room and I picked up on the call to inform patient that as of today Jasmine December has requested PA information and is awaiting paperwork to be completed and sent back to insurance company.   Patient then expressed how disappointed she was that this has taking almost a month to do. Patient requested to speak with management.

## 2011-03-23 ENCOUNTER — Telehealth: Payer: Self-pay

## 2011-03-23 NOTE — Telephone Encounter (Signed)
Error

## 2011-03-23 NOTE — Telephone Encounter (Signed)
Called patient.  Verified she took last dose of Fosamax this week.  Called [employer] and Tamesha completing new PA form.  Dr. Drue Novel to sign and send before 5p today.  [employer] rep advised approval within 72 hours.  Tamesha to call tomorrow to verify receipt.

## 2011-03-23 NOTE — Telephone Encounter (Signed)
PA form faxed to [facility] at 4:44pm

## 2011-03-24 NOTE — Telephone Encounter (Signed)
Left message to advise pt that form was faxed to [facility]

## 2011-03-25 NOTE — Telephone Encounter (Signed)
Spoke with Greggory Stallion at [facility] to verify that they received the PA form that was faxed on 03/23/11. He advised again that they have a 72 hour turnaround time and that the form is still in review.

## 2011-03-26 ENCOUNTER — Other Ambulatory Visit: Payer: Medicare Other

## 2011-03-26 NOTE — Telephone Encounter (Signed)
Pt called to advise that her insurance company notified her that the actonel was denied. They gave the generic med listed in my previous message, ibandronate, and she would like to know if she can try that one or Boniva. Please advise asap

## 2011-03-26 NOTE — Telephone Encounter (Signed)
Boniva 150 mg 1 tab weekly. Please remind patient about precautions on how to take it

## 2011-03-26 NOTE — Telephone Encounter (Signed)
PA for Actonel denied by [facility]. Preferred drugs, Boniva and Ibandronate

## 2011-03-27 ENCOUNTER — Telehealth: Payer: Self-pay | Admitting: Internal Medicine

## 2011-03-27 MED ORDER — IBANDRONATE SODIUM 150 MG PO TABS: 150.0000 mg | ORAL_TABLET | ORAL | Status: DC

## 2011-03-27 NOTE — Telephone Encounter (Signed)
Done

## 2011-03-27 NOTE — Telephone Encounter (Signed)
Left detailed message to notify pt Rx sent to pharmacy and of detailed instructions. Advised pt to call office if she has any questions

## 2011-04-01 ENCOUNTER — Other Ambulatory Visit: Payer: Self-pay | Admitting: Internal Medicine

## 2011-04-01 DIAGNOSIS — Z0000 Encounter for general adult medical examination without abnormal findings: Secondary | ICD-10-CM

## 2011-04-01 DIAGNOSIS — Z79899 Other long term (current) drug therapy: Secondary | ICD-10-CM

## 2011-04-02 ENCOUNTER — Other Ambulatory Visit (INDEPENDENT_AMBULATORY_CARE_PROVIDER_SITE_OTHER): Payer: Medicare Other

## 2011-04-02 DIAGNOSIS — E875 Hyperkalemia: Secondary | ICD-10-CM

## 2011-04-02 NOTE — Progress Notes (Signed)
12  

## 2011-04-03 ENCOUNTER — Telehealth: Payer: Self-pay

## 2011-04-03 NOTE — Telephone Encounter (Signed)
Message copied by Beverely Low on Fri Apr 03, 2011 10:22 AM ------      Message from: Willow Ora E      Created: Fri Apr 03, 2011  8:20 AM       Advise patient, good results

## 2011-04-03 NOTE — Telephone Encounter (Signed)
Pt aware of nl potassium

## 2011-04-20 ENCOUNTER — Telehealth: Payer: Self-pay | Admitting: Internal Medicine

## 2011-04-20 NOTE — Telephone Encounter (Signed)
Patient needs new rx for 3 month supply Simvastatin - [facility] [address] [place]

## 2011-04-20 NOTE — Telephone Encounter (Signed)
Rx sent to pharmacy on 02/24/11 with 4 refills

## 2011-04-27 ENCOUNTER — Other Ambulatory Visit: Payer: Self-pay | Admitting: Internal Medicine

## 2011-04-27 NOTE — Telephone Encounter (Signed)
patient needs new rx for 90 day supply simvastatin [facility]  [place] - rx that was sent in oct was a 30 day supply

## 2011-04-27 NOTE — Telephone Encounter (Signed)
Pt calls back and also needs clopidogrel-90 day supply

## 2011-04-28 ENCOUNTER — Telehealth: Payer: Self-pay | Admitting: Internal Medicine

## 2011-04-28 MED ORDER — SIMVASTATIN 40 MG PO TABS: 40.0000 mg | ORAL_TABLET | Freq: Every day | ORAL | Status: DC

## 2011-04-28 MED ORDER — CLOPIDOGREL BISULFATE 75 MG PO TABS: ORAL_TABLET | ORAL | Status: DC

## 2011-04-28 NOTE — Telephone Encounter (Signed)
Patient wants 90 day supply clopidogrel (plavix) [facility] [address] [place]

## 2011-04-28 NOTE — Telephone Encounter (Signed)
Done

## 2011-04-28 NOTE — Telephone Encounter (Signed)
done

## 2011-04-30 ENCOUNTER — Telehealth: Payer: Self-pay | Admitting: Internal Medicine

## 2011-04-30 MED ORDER — CLOPIDOGREL BISULFATE 75 MG PO TABS: ORAL_TABLET | ORAL | Status: DC

## 2011-04-30 NOTE — Telephone Encounter (Signed)
Refill for plavix 90 day supply  was sent to [facility] [address] [place] on [id number] - -patient check with pharmacy several times & they didn't  Receive request - please resend

## 2011-04-30 NOTE — Telephone Encounter (Signed)
Rx resent.

## 2011-06-01 ENCOUNTER — Ambulatory Visit: Payer: Medicare Other | Admitting: Pulmonary Disease

## 2011-06-05 ENCOUNTER — Telehealth: Payer: Self-pay | Admitting: Pulmonary Disease

## 2011-06-05 MED ORDER — BENZONATATE 100 MG PO CAPS: ORAL_CAPSULE | ORAL | Status: DC

## 2011-06-05 NOTE — Telephone Encounter (Signed)
Pt c/o dry cough that started this morning and chest tightness. She says the cough is so bad that this makes her chest hurt. She denies feeling congested, no increased sob or wheezing and no fever. She also said she does not feel this has been caused by allergy or sinus drainage.Will forward to [facility] for recs. Pls advise.No Known Allergies

## 2011-06-05 NOTE — Telephone Encounter (Signed)
Can use mucinex dm extra strength one in am and pm until better. Can also call in tessalon pearls 100mg   2 every 6hrs if needed for cough (#30, no fills) Let us know if she gets worse or begins to bring up nasty mucus.

## 2011-06-05 NOTE — Telephone Encounter (Signed)
I spoke with pt and is aware of [facility] recs. rx has been sent to the pharmacy and pt is aware of directions

## 2011-06-27 ENCOUNTER — Other Ambulatory Visit: Payer: Self-pay | Admitting: Pulmonary Disease

## 2011-07-24 ENCOUNTER — Other Ambulatory Visit: Payer: Self-pay | Admitting: Internal Medicine

## 2011-07-24 NOTE — Telephone Encounter (Signed)
Prescription sent to pharmacy.

## 2011-07-29 ENCOUNTER — Ambulatory Visit: Payer: Medicare Other | Admitting: Pulmonary Disease

## 2011-07-29 ENCOUNTER — Ambulatory Visit (INDEPENDENT_AMBULATORY_CARE_PROVIDER_SITE_OTHER): Payer: Medicare Other | Admitting: Pulmonary Disease

## 2011-07-29 ENCOUNTER — Encounter: Payer: Self-pay | Admitting: Pulmonary Disease

## 2011-07-29 DIAGNOSIS — J9610 Chronic respiratory failure, unspecified whether with hypoxia or hypercapnia: Secondary | ICD-10-CM

## 2011-07-29 DIAGNOSIS — J449 Chronic obstructive pulmonary disease, unspecified: Secondary | ICD-10-CM

## 2011-07-29 MED ORDER — BENZONATATE 100 MG PO CAPS: ORAL_CAPSULE | ORAL | Status: AC

## 2011-07-29 NOTE — Patient Instructions (Addendum)
No change in pulmonary meds Continue to stay active with your walking Will try dymista 2 sprays each nostril each am for next 2 weeks to see if it helps your cough.  If not, discontinue after 2 weeks. If doing well, followup with me in 6mos

## 2011-07-29 NOTE — Progress Notes (Signed)
  Subjective:    Patient ID: Helen Scott, female    DOB: January 18, 1951, 61 y.o.   MRN: [id number]  HPI The patient comes in today for followup of her known severe emphysema with chronic respiratory failure.  She feels that her breathing is doing very well on her current regimen, but she continues to have her intermittent cough.  It is a little bit rattling, but it is unclear if it is coming from her upper or lower airway.  She does have some postnasal drip which has greatly improved with chlorpheniramine.  She has had a chest x-ray 6 months ago that was without acute process.  She denies having any purulence to her mucus.   Review of Systems  Constitutional: Negative for fever and unexpected weight change.  HENT: Positive for congestion, rhinorrhea and postnasal drip. Negative for ear pain, nosebleeds, sore throat, sneezing, trouble swallowing, dental problem and sinus pressure.   Eyes: Negative for redness and itching.  Respiratory: Positive for cough and shortness of breath. Negative for chest tightness and wheezing.   Cardiovascular: Negative for palpitations and leg swelling.  Gastrointestinal: Negative for nausea and vomiting.  Genitourinary: Negative for dysuria.  Musculoskeletal: Negative for joint swelling.  Skin: Negative for rash.  Neurological: Negative for headaches.  Hematological: Bruises/bleeds easily.  Psychiatric/Behavioral: Negative for dysphoric mood. The patient is not nervous/anxious.        Objective:   Physical Exam Well-developed female in no acute distress Nose without purulence or discharge noted Chest with very decreased breath sounds throughout, no wheezes or rhonchi Cardiac exam is regular rate and rhythm Lower extremities without edema, no cyanosis Alert and oriented, moves all 4 extremities.       Assessment & Plan:

## 2011-07-29 NOTE — Assessment & Plan Note (Addendum)
The patient is doing fairly well from a COPD standpoint, and is staying very active with daily exercise.  She feels that her breathing is at baseline, and that her current inhaler regimen has helped her tremendously.  I have asked her to continue on this.  She continues to have a cough that is more bothersome than anything else, and it is hard to tell when listening to it whether it is from her lower or upper airway.  She did see some improvement with chlorpheniramine, and I wonder if ongoing postnasal drip could be the issue.  We'll try her on a nasal spray to see if this makes a difference.  If her cough continues, would consider a followup chest x-ray.  A film 6 months ago showed no acute process.

## 2011-08-12 ENCOUNTER — Telehealth: Payer: Self-pay | Admitting: *Deleted

## 2011-08-12 NOTE — Telephone Encounter (Signed)
Opened in error

## 2011-08-26 ENCOUNTER — Other Ambulatory Visit: Payer: Self-pay | Admitting: Internal Medicine

## 2011-08-27 NOTE — Telephone Encounter (Signed)
Refill done.  

## 2011-09-24 ENCOUNTER — Other Ambulatory Visit: Payer: Self-pay | Admitting: Pulmonary Disease

## 2011-09-24 ENCOUNTER — Telehealth: Payer: Self-pay | Admitting: Pulmonary Disease

## 2011-09-24 MED ORDER — MOMETASONE FURO-FORMOTEROL FUM 100-5 MCG/ACT IN AERO: INHALATION_SPRAY | RESPIRATORY_TRACT | Status: DC

## 2011-09-24 NOTE — Telephone Encounter (Signed)
I spoke with pt and she is requesting a 90 day supply of her dulera sent to [facility] instead of a 30 day supply. I have sent new rx and nothing further was needed

## 2011-11-23 ENCOUNTER — Other Ambulatory Visit: Payer: Self-pay | Admitting: Internal Medicine

## 2011-11-23 NOTE — Telephone Encounter (Signed)
Refill done.  

## 2012-02-03 ENCOUNTER — Encounter: Payer: Self-pay | Admitting: Pulmonary Disease

## 2012-02-03 ENCOUNTER — Ambulatory Visit (INDEPENDENT_AMBULATORY_CARE_PROVIDER_SITE_OTHER): Payer: Medicare Other | Admitting: Pulmonary Disease

## 2012-02-03 VITALS — BP 110/72 | HR 108 | Temp 98.4°F | Ht 63.0 in | Wt 133.2 lb

## 2012-02-03 DIAGNOSIS — J449 Chronic obstructive pulmonary disease, unspecified: Secondary | ICD-10-CM

## 2012-02-03 DIAGNOSIS — J9610 Chronic respiratory failure, unspecified whether with hypoxia or hypercapnia: Secondary | ICD-10-CM

## 2012-02-03 DIAGNOSIS — Z23 Encounter for immunization: Secondary | ICD-10-CM

## 2012-02-03 MED ORDER — ALBUTEROL SULFATE HFA 108 (90 BASE) MCG/ACT IN AERS: 1.0000 | INHALATION_SPRAY | RESPIRATORY_TRACT | Status: DC | PRN

## 2012-02-03 MED ORDER — TIOTROPIUM BROMIDE MONOHYDRATE 18 MCG IN CAPS: 18.0000 ug | ORAL_CAPSULE | Freq: Every day | RESPIRATORY_TRACT | Status: DC

## 2012-02-03 NOTE — Patient Instructions (Addendum)
Continue on current medications. Stay active with your exercise program Will give you a flu shot today. followup with me in 6 mos

## 2012-02-03 NOTE — Progress Notes (Signed)
  Subjective:    Patient ID: Helen Scott, female    DOB: 1950/05/27, 61 y.o.   MRN: [id number]  HPI The patient comes in today for followup of her known severe emphysema with chronic respiratory failure.  She had a cough at the last visit, but this has greatly improved with more aggressive treatment of allergic rhinitis.  She feels that her breathing is at baseline, and has continued in her exercise program.  She denies any pulmonary infections or acute exacerbation since last visit.   Review of Systems  Constitutional: Negative for fever and unexpected weight change.  HENT: Negative for ear pain, nosebleeds, congestion, sore throat, rhinorrhea, sneezing, trouble swallowing, dental problem, postnasal drip and sinus pressure.   Eyes: Negative for redness and itching.  Respiratory: Negative for cough, chest tightness, shortness of breath and wheezing.   Cardiovascular: Negative for palpitations and leg swelling.  Gastrointestinal: Negative for nausea and vomiting.  Genitourinary: Negative for dysuria.  Musculoskeletal: Negative for joint swelling.  Skin: Negative for rash.  Neurological: Negative for headaches.  Hematological: Bruises/bleeds easily.  Psychiatric/Behavioral: Negative for dysphoric mood. The patient is not nervous/anxious.        Objective:   Physical Exam Well-developed female in no acute distress Nose without purulence or discharge noted Chest with very diminished breath sounds, no active wheezing Cardiac exam with distant heart sounds but regular Lower extremities without edema, no cyanosis Alert and oriented, moves all 4 extremities.       Assessment & Plan:

## 2012-02-03 NOTE — Assessment & Plan Note (Signed)
The patient feels that she is doing fairly well from a pulmonary standpoint, he continues to be active in her exercise program.  Her cough has resolved with more aggressive treatment of postnasal drip, and she feels that her exertional tolerance is at baseline.  I have asked her to continue with her current medications, and to stay as active as possible.

## 2012-02-19 ENCOUNTER — Other Ambulatory Visit: Payer: Self-pay | Admitting: Internal Medicine

## 2012-02-19 NOTE — Telephone Encounter (Signed)
Refill done. Pt has future appt scheduled 10.1.13.

## 2012-02-23 ENCOUNTER — Encounter: Payer: Self-pay | Admitting: Internal Medicine

## 2012-02-23 ENCOUNTER — Ambulatory Visit (INDEPENDENT_AMBULATORY_CARE_PROVIDER_SITE_OTHER): Payer: Medicare Other | Admitting: Internal Medicine

## 2012-02-23 VITALS — BP 112/72 | HR 113 | Temp 98.6°F | Ht 62.75 in | Wt 130.0 lb

## 2012-02-23 DIAGNOSIS — E785 Hyperlipidemia, unspecified: Secondary | ICD-10-CM

## 2012-02-23 DIAGNOSIS — Z0000 Encounter for general adult medical examination without abnormal findings: Secondary | ICD-10-CM

## 2012-02-23 DIAGNOSIS — M899 Disorder of bone, unspecified: Secondary | ICD-10-CM

## 2012-02-23 NOTE — Assessment & Plan Note (Signed)
Good medication compliance, has a healthy lifestyle, labs

## 2012-02-23 NOTE — Progress Notes (Signed)
  Subjective:    Patient ID: Helen Scott, female    DOB: May 14, 1951, 61 y.o.   MRN: [id number]  HPI Here for Medicare AWV:  1. Risk factors based on Past M, S, F history: reviewed  2. Physical Activities: walks 1 mile a day, does weights, exercises 5 times a week  3. Depression/mood: No problems noted or reported  4. Hearing: No problems noted or reported  5. ADL's: Independent, still drives  6. Fall Risk: fall prevention  7. home Safety: does feel safe at home  8. Height, weight, &visual acuity: see VS, vision normal, had a exam 12-2011  9. Counseling: provided  10. Labs ordered based on risk factors: if needed  11. Referral Coordination: if needed  12. Care Plan, see assessment and plan  13. Cognitive Assessment: Cognition and motor skills intact   In addition, today we discussed the following: COPD, good compliance with medication, symptoms at baseline Hyperlipidemia, good compliance with simvastatin at no apparent side effects Osteopenia, takes boniva without apparent difficulties.  Past medical history PUD CVA 2001 Emphysema Hyperlipidemia  Past surgical history, Partial gastrectomy 1985 Right breast biopsy in the 80s Tubal ligation in the 74s  Family History:  Diabetes-- F (on diet, 62 y/o) Hypertension--denies   Stroke--denies   Breast Cancer-- aunt, great aunt  and 2 first cousins Colon Cancer-- denies   heart disease-- father (m.i. At age 55s )   Social History: one living child, lost one Single, lives by self tobacco-- quit 2007.  started at age 65.  1 ppd.   ETOH-- no   on disability.  previously worked in Clinical biochemist. diet--  healthy  most of the time  exercise -- daily, walks a mile     Review of Systems Denies chest pain, palpitations or edema Respiratory symptoms at baseline, actually feeling pretty well, good medication compliance Denies nausea, vomiting, diarrhea, had constipation one time 2 weeks ago but symptoms resolved. No blood in  the stools. Denies dysuria or gross hematuria.     Objective:   Physical Exam  General -- alert, well-developed, and well-nourished.   Neck --no thyromegaly , normal carotid pulse Lungs -- decreased breath sounds but clear Heart-- normal rate, regular rhythm, no murmur, and no gallop.   Abdomen--soft, non-tender, no distention, no masses, no HSM, no guarding, and no rigidity.   Extremities-- no pretibial edema bilaterally Neurologic-- alert & oriented X3 and strength normal in all extremities. Psych-- Cognition and judgment appear intact. Alert and cooperative with normal attention span and concentration.  not anxious appearing and not depressed appearing.       Assessment & Plan:

## 2012-02-23 NOTE — Assessment & Plan Note (Signed)
Td 2008, pneumonia shot 01-2009, shingles shot 2011, had a  flu shot already   Female care per gyn , sees him every other  year Dr Achilles Dunk ([facility]) [facility] schedule for 02-02-12 in [facility]  cscope   done @ [facility]  9- 2010, neg, next 10 years   diet and exercise -- doing great

## 2012-02-23 NOTE — Assessment & Plan Note (Signed)
Last bone density test 02-2011, T score decreased to -2.4, previous T score -2.0. At the time we discontinue Fosamax, she is now on Boniva hoping for better compliance and better results. Good compliance with  calcium and vitamin D. History of low vitamin D  Plan: Continue Calcium, vitamin D and check a vitamin D level.  Next  DEXA 10-14

## 2012-02-23 NOTE — Patient Instructions (Addendum)
Come back fasting: FLP, CMP--- dx  hyperlipidemia TSH --dx  osteopenia Vitamin D--- dx  vitamin D deficiency ------ Next visit in one year or as long as you feel well.

## 2012-02-24 ENCOUNTER — Other Ambulatory Visit (INDEPENDENT_AMBULATORY_CARE_PROVIDER_SITE_OTHER): Payer: Medicare Other

## 2012-02-24 DIAGNOSIS — M899 Disorder of bone, unspecified: Secondary | ICD-10-CM

## 2012-02-24 DIAGNOSIS — E559 Vitamin D deficiency, unspecified: Secondary | ICD-10-CM

## 2012-02-24 DIAGNOSIS — E785 Hyperlipidemia, unspecified: Secondary | ICD-10-CM

## 2012-02-24 DIAGNOSIS — M8580 Other specified disorders of bone density and structure, unspecified site: Secondary | ICD-10-CM

## 2012-02-24 LAB — TSH: TSH: 1.69 u[IU]/mL (ref 0.35–5.50)

## 2012-02-24 LAB — LIPID PANEL
Cholesterol: 146 mg/dL (ref 0–200)
LDL Cholesterol: 79 mg/dL (ref 0–99)
Triglycerides: 48 mg/dL (ref 0.0–149.0)
VLDL: 9.6 mg/dL (ref 0.0–40.0)

## 2012-02-24 LAB — COMPREHENSIVE METABOLIC PANEL
AST: 19 U/L (ref 0–37)
Alkaline Phosphatase: 53 U/L (ref 39–117)
BUN: 10 mg/dL (ref 6–23)
Calcium: 8.7 mg/dL (ref 8.4–10.5)
Creatinine, Ser: 0.6 mg/dL (ref 0.4–1.2)
Glucose, Bld: 94 mg/dL (ref 70–99)

## 2012-02-29 ENCOUNTER — Encounter: Payer: Self-pay | Admitting: *Deleted

## 2012-02-29 LAB — VITAMIN D 1,25 DIHYDROXY
Vitamin D 1, 25 (OH)2 Total: 55 pg/mL (ref 18–72)
Vitamin D3 1, 25 (OH)2: 55 pg/mL

## 2012-04-12 ENCOUNTER — Telehealth: Payer: Self-pay | Admitting: Pulmonary Disease

## 2012-04-12 MED ORDER — IPRATROPIUM-ALBUTEROL 0.5-2.5 (3) MG/3ML IN SOLN: 3.0000 mL | Freq: Four times a day (QID) | RESPIRATORY_TRACT | Status: DC | PRN

## 2012-04-12 NOTE — Telephone Encounter (Signed)
Rx has been sent in for the pt. She is aware.

## 2012-04-29 ENCOUNTER — Other Ambulatory Visit: Payer: Self-pay | Admitting: Internal Medicine

## 2012-04-29 NOTE — Telephone Encounter (Signed)
Refill done.  

## 2012-05-12 ENCOUNTER — Other Ambulatory Visit: Payer: Self-pay | Admitting: Internal Medicine

## 2012-05-12 NOTE — Telephone Encounter (Signed)
Refill done.  

## 2012-07-25 ENCOUNTER — Other Ambulatory Visit: Payer: Self-pay | Admitting: Internal Medicine

## 2012-07-25 NOTE — Telephone Encounter (Signed)
Refill done.  

## 2012-08-03 ENCOUNTER — Ambulatory Visit (INDEPENDENT_AMBULATORY_CARE_PROVIDER_SITE_OTHER): Payer: Medicare Other | Admitting: Pulmonary Disease

## 2012-08-03 ENCOUNTER — Telehealth: Payer: Self-pay | Admitting: Pulmonary Disease

## 2012-08-03 ENCOUNTER — Encounter: Payer: Self-pay | Admitting: Pulmonary Disease

## 2012-08-03 VITALS — BP 140/80 | HR 109 | Temp 99.1°F | Ht 63.75 in | Wt 131.0 lb

## 2012-08-03 DIAGNOSIS — J449 Chronic obstructive pulmonary disease, unspecified: Secondary | ICD-10-CM

## 2012-08-03 DIAGNOSIS — J441 Chronic obstructive pulmonary disease with (acute) exacerbation: Secondary | ICD-10-CM | POA: Insufficient documentation

## 2012-08-03 DIAGNOSIS — J9610 Chronic respiratory failure, unspecified whether with hypoxia or hypercapnia: Secondary | ICD-10-CM

## 2012-08-03 MED ORDER — PREDNISONE 10 MG PO TABS: ORAL_TABLET | ORAL | Status: DC
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
## 2012-08-03 NOTE — Patient Instructions (Addendum)
Will treat with a short course of prednisone to get you thru this episode. Please call if you start bringing up more discolored mucus and feel you are getting a chest cold.  followup with me in 6mos, but call if you are not getting back to your usual baseline.

## 2012-08-03 NOTE — Telephone Encounter (Signed)
Rx has been sent to [facility]. Pt is aware, nothing further was needed.
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
## 2012-08-22 ENCOUNTER — Other Ambulatory Visit: Payer: Self-pay | Admitting: Internal Medicine

## 2012-08-22 NOTE — Telephone Encounter (Signed)
Refill done.  

## 2012-08-24 ENCOUNTER — Telehealth: Payer: Self-pay | Admitting: Pulmonary Disease

## 2012-08-24 NOTE — Telephone Encounter (Signed)
I spoke with the pt and she states she has been moving over the last few days and has noticed som swelling in her ankles. She denies any SOB, chest tightness, cough, wheezing at this time. I advised the pt to contact her PCP for further recs since this is a new symptom for her and she is doing well breathing wise. Pt states understanding. Carron Curie, CMA

## 2012-08-26 ENCOUNTER — Ambulatory Visit (INDEPENDENT_AMBULATORY_CARE_PROVIDER_SITE_OTHER): Payer: Medicare Other | Admitting: Internal Medicine

## 2012-08-26 VITALS — BP 130/78 | HR 107 | Temp 97.8°F | Wt 128.0 lb

## 2012-08-26 DIAGNOSIS — R609 Edema, unspecified: Secondary | ICD-10-CM

## 2012-08-26 NOTE — Progress Notes (Signed)
  Subjective:    Patient ID: Helen Scott, female    DOB: 04-Nov-1950, 62 y.o.   MRN: [id number]  HPI Acute visit For the last 2 days has noted swelling at the end of the day around her ankles. Reports that she is moving from her house, has been on her feet a lot. On further questioning, she has been eating out frequently (and consequently eating  more salt I suspect)  Past Medical History  Diagnosis Date  . PUD (peptic ulcer disease)      s/p partial gastrectomy 1985.    Marland Kitchen CVA (cerebral infarction) 06/1999  . Emphysema     FEV1 0.49, FEV1% 27 on 5/10  . Hyperlipidemia    Past Surgical History  Procedure Laterality Date  . Partial gastrectomy       s/p partial gastrectomy 1985.    . Breast biopsy  80s    R breast   . Tubal ligation  1980s     Review of Systems Denies actual chest pain, difficulty breathing is at baseline. Denies orthopnea per se, she uses 2 or 3 pillows which is her normal. Not taking any new medication. Using her oxygen consistently.     Objective:   Physical Exam Has not gained weight, slightly tachycardic which is  nothing new General -- alert, well-developed  Neck --no JVD at 45  Lungs -- quite decreased breath sounds, no rhonchi crackles. Heart-- normal rate, regular rhythm, no murmur, and no gallop.   Extremities-- trace peri ankle edema, calves no swollen or tender  Psych-- Cognition and judgment appear intact. Alert and cooperative with normal attention span and concentration.  not anxious appearing and not depressed appearing.       Assessment & Plan:

## 2012-08-26 NOTE — Patient Instructions (Addendum)
Elevated your legs  30-40 minutes at midmorning and midafternoon. Low salt! Consider compression stockings to prevent swelling. Call if you're not improving or if you get worse

## 2012-08-27 ENCOUNTER — Encounter: Payer: Self-pay | Admitting: Internal Medicine

## 2012-08-27 DIAGNOSIS — R609 Edema, unspecified: Secondary | ICD-10-CM | POA: Insufficient documentation

## 2012-08-27 NOTE — Assessment & Plan Note (Signed)
Edema, Likely a combination of things including staying on her feet more than usual, eating more salt, recent use of prednisone. I have several recommendations, see instructions. Will call if not better.

## 2012-09-19 ENCOUNTER — Telehealth: Payer: Self-pay | Admitting: Pulmonary Disease

## 2012-09-19 MED ORDER — AMOXICILLIN-POT CLAVULANATE 875-125 MG PO TABS: 1.0000 | ORAL_TABLET | Freq: Two times a day (BID) | ORAL | Status: DC

## 2012-09-19 NOTE — Telephone Encounter (Signed)
Pt called back re: same. Helen Scott °

## 2012-09-19 NOTE — Telephone Encounter (Signed)
I went and spoke with [facility]. He stated to call in 1 week of Augmentin. I spoke with pt and is aware of KC recs. She voiced her understanding and rx has been called in

## 2012-09-19 NOTE — Telephone Encounter (Signed)
Sounds like a sinusitis Call in augmentin 875mg  one am and pm on full stomach with large glass of water.  Can use neilmed sinus rinses am and pm to help clear out mucus Let us know if she is not getting better.

## 2012-09-19 NOTE — Telephone Encounter (Signed)
Last OV 08-03-12. Pt is c/o nasal congestion, sinus congestion and pressure, yellow mucus from nose,  ear pain, low grade fever, slight increase in cough, and chest congestion. Denies any increase in SOB or mucus production and mucus that she is coughing up is clear. Pt states she has been taking chlortabs without relief. Pt OK to come for an appt but ONLY wants to see KC, no appts available today.  Please advise. Carron Curie, CMA No Known Allergies

## 2012-09-26 ENCOUNTER — Telehealth: Payer: Self-pay | Admitting: Pulmonary Disease

## 2012-09-26 NOTE — Telephone Encounter (Signed)
Pt called back & asked to now be reached at [phone].  Helen Scott

## 2012-09-26 NOTE — Telephone Encounter (Signed)
i spoke with pt and is aware of KC recs. She voiced her understanding and needed nothing further

## 2012-09-26 NOTE — Telephone Encounter (Signed)
See if she can use neilmed sinus rinses am and pm to open up sinuses.  Can also use afrin 2 sprays each nostril in am and pm AFTER doing rinses if still congested.  Cannot use afrin more than 4 days.

## 2012-09-26 NOTE — Telephone Encounter (Signed)
I spoke with pt. She c/o cough w. Cloudy colored phlem, sinus congestion (no drainage), feels weak, ears still feels stopped up, slight chest congestion, heard to breathe d/t sinus congestion (uses O2) x 1 week. No f/c/s/n/v. She has finished all her medications KC prescribed to her last week. She has appt scheduled to see [facility] tomorrow at 3:30 but wanting recs in the meantime. Please advise thanks  No Known Allergies

## 2012-09-27 ENCOUNTER — Encounter: Payer: Self-pay | Admitting: Pulmonary Disease

## 2012-09-27 ENCOUNTER — Ambulatory Visit (INDEPENDENT_AMBULATORY_CARE_PROVIDER_SITE_OTHER): Payer: Medicare Other | Admitting: Pulmonary Disease

## 2012-09-27 ENCOUNTER — Other Ambulatory Visit: Payer: Self-pay | Admitting: Pulmonary Disease

## 2012-09-27 VITALS — BP 118/72 | HR 95 | Temp 97.5°F | Ht 63.0 in | Wt 122.0 lb

## 2012-09-27 DIAGNOSIS — J019 Acute sinusitis, unspecified: Secondary | ICD-10-CM

## 2012-09-27 MED ORDER — PREDNISONE 10 MG PO TABS: ORAL_TABLET | ORAL | Status: DC

## 2012-09-27 MED ORDER — LEVOFLOXACIN 750 MG PO TABS: 750.0000 mg | ORAL_TABLET | Freq: Every day | ORAL | Status: DC

## 2012-09-27 NOTE — Assessment & Plan Note (Signed)
The patient's history is most consistent with acute sinusitis.  She really did not have a good response to Augmentin for one week, and we'll therefore change her over to a fluoroquinolone and extend the course.  I also stressed to her the importance of an aggressive nasal hygiene regimen.  Will also give her a short course of prednisone to help with mucosal edema and inflammation.

## 2012-09-27 NOTE — Progress Notes (Signed)
  Subjective:    Patient ID: Helen Scott, female    DOB: January 19, 1951, 62 y.o.   MRN: [id number]  HPI The patient comes in today for an acute sick visit.  She has known severe COPD with chronic respiratory failure, and has had ongoing symptoms for a few weeks now.  At her last visit, she was felt to have a COPD exacerbation, and was treated with a short course of prednisone.  She did have improvement, then developed classic symptoms for acute sinusitis.  2 treated with Augmentin for one week including a nasal hygiene regimen but no improvement.  She comes in today where she has significant sinus pressure and congestion, and is unable to blow purulent mucus from her nose.  She is using sinus rinses regularly.  Surprisingly, she has not really had increasing shortness of breath or other chest symptoms, but has difficulty getting her nasal oxygen through her nasal passages.   Review of Systems  Constitutional: Positive for fatigue ( low endurance level). Negative for fever and unexpected weight change.  HENT: Negative for ear pain, nosebleeds, congestion, sore throat, rhinorrhea, sneezing, trouble swallowing, dental problem, postnasal drip and sinus pressure.   Eyes: Negative for redness and itching.  Respiratory: Positive for cough, chest tightness and shortness of breath. Negative for wheezing.   Cardiovascular: Positive for chest pain. Negative for palpitations and leg swelling.  Gastrointestinal: Negative for nausea and vomiting.  Genitourinary: Negative for dysuria.  Musculoskeletal: Negative for joint swelling.  Skin: Negative for rash.  Neurological: Positive for weakness. Negative for headaches.  Hematological: Does not bruise/bleed easily.  Psychiatric/Behavioral: Negative for dysphoric mood. The patient is not nervous/anxious.        Objective:   Physical Exam Thin female in no acute distress Nose without purulence or discharge noted, however there was erythematous mucosa Oropharynx  clear Neck without lymphadenopathy or thyromegaly Chest with very diminished breath sounds throughout, no active wheezing noted Cardiac exam with regular rate and rhythm Lower extremities without edema, no cyanosis noted Alert and oriented, moves all 4 extremities.       Assessment & Plan:

## 2012-09-27 NOTE — Patient Instructions (Addendum)
Will treat with levaquin 750mg  one a day for 10 days for sinusitis. Prednisone taper as directed. Use neilmed sinus rinses am and pm until better. Can use afrin twice a day for no more than 4-5 days. Let us know if you are not improving by next week, and will check xray of sinuses.

## 2012-09-30 ENCOUNTER — Telehealth: Payer: Self-pay | Admitting: Pulmonary Disease

## 2012-09-30 MED ORDER — MOMETASONE FURO-FORMOTEROL FUM 100-5 MCG/ACT IN AERO: 2.0000 | INHALATION_SPRAY | Freq: Two times a day (BID) | RESPIRATORY_TRACT | Status: DC

## 2012-09-30 NOTE — Telephone Encounter (Addendum)
Pt called back again re: same. Says she has been waiting for refill for 1 wk. Pt is frustrated. Please call her back asap.  Helen Scott

## 2012-09-30 NOTE — Addendum Note (Signed)
Addended by: Nita Sells on: 09/30/2012 12:28 PM   Modules accepted: Orders

## 2012-09-30 NOTE — Telephone Encounter (Signed)
[facility] Rx faxed to [facility]. Per Pt request.

## 2012-09-30 NOTE — Telephone Encounter (Signed)
Pt aware this has been called into [employer].

## 2013-01-23 ENCOUNTER — Other Ambulatory Visit: Payer: Self-pay | Admitting: Internal Medicine

## 2013-01-24 NOTE — Telephone Encounter (Signed)
Refill done.  

## 2013-02-02 ENCOUNTER — Encounter: Payer: Self-pay | Admitting: Pulmonary Disease

## 2013-02-02 ENCOUNTER — Ambulatory Visit (INDEPENDENT_AMBULATORY_CARE_PROVIDER_SITE_OTHER): Payer: Medicare Other | Admitting: Pulmonary Disease

## 2013-02-02 VITALS — BP 92/60 | HR 104 | Temp 98.3°F | Ht 63.0 in | Wt 120.2 lb

## 2013-02-02 DIAGNOSIS — J449 Chronic obstructive pulmonary disease, unspecified: Secondary | ICD-10-CM

## 2013-02-02 DIAGNOSIS — Z23 Encounter for immunization: Secondary | ICD-10-CM

## 2013-02-02 DIAGNOSIS — J9610 Chronic respiratory failure, unspecified whether with hypoxia or hypercapnia: Secondary | ICD-10-CM

## 2013-02-02 NOTE — Patient Instructions (Addendum)
No change in medications. Continue on your exercise program Will give you a flu shot today. followup with me in 6mos.

## 2013-02-02 NOTE — Progress Notes (Signed)
  Subjective:    Patient ID: Helen Scott, female    DOB: Nov 12, 1950, 62 y.o.   MRN: [id number]  HPI Patient comes in today for followup of her known COPD with chronic respiratory failure.  She has done well since her last visit, and has not had an acute exacerbation.  She is continuing on her exercise program, and feels that her exertional tolerance is at baseline.  She denies any chronic cough, congestion, purulent mucus.   Review of Systems  Constitutional: Negative for fever and unexpected weight change.  HENT: Negative for ear pain, nosebleeds, congestion, sore throat, rhinorrhea, sneezing, trouble swallowing, dental problem, postnasal drip and sinus pressure.        Allergies  Eyes: Negative for redness and itching.  Respiratory: Negative for cough, chest tightness, shortness of breath and wheezing.   Cardiovascular: Negative for palpitations and leg swelling.  Gastrointestinal: Negative for nausea and vomiting.  Genitourinary: Negative for dysuria.  Musculoskeletal: Negative for joint swelling.  Skin: Negative for rash.  Neurological: Negative for headaches.  Hematological: Does not bruise/bleed easily.  Psychiatric/Behavioral: Negative for dysphoric mood. The patient is not nervous/anxious.        Objective:   Physical Exam Well-developed female in no acute distress Nose without purulence or discharge noted Neck without lymphadenopathy or thyromegaly Chest with very diminished breath sounds, no active wheezing Cardiac with distant heart sounds, but regular Lower extremities without edema, cyanosis Alert and oriented, moves all 4 extremities.       Assessment & Plan:

## 2013-02-02 NOTE — Assessment & Plan Note (Signed)
The patient is doing very well from a pulmonary standpoint, and I've asked her to continue on her current bronchodilator regimen and her exercise program.  We'll give her the flu shot today, and she is to followup again with me in 6 months.

## 2013-02-21 ENCOUNTER — Other Ambulatory Visit: Payer: Self-pay | Admitting: Pulmonary Disease

## 2013-02-21 ENCOUNTER — Other Ambulatory Visit: Payer: Self-pay | Admitting: Internal Medicine

## 2013-02-21 NOTE — Telephone Encounter (Signed)
rx refilled per protocol. DJR  

## 2013-03-08 ENCOUNTER — Telehealth: Payer: Self-pay

## 2013-03-08 NOTE — Telephone Encounter (Signed)
Medication and allergies: done  [facility] [address] [place] for local pharmacy   Immunizations due: UTD  A/P:  Last:  PAP: Abnormal 02/21/2013 biopsy pending MMG: UTD 02/2013 Dexa: UTD 02/2013  CCS: UTD 04/2009 DM: na HTN: na Lipids: due  To Discuss with Provider: FYI Abnormal pap smear. Had biopsy 03/07/2013

## 2013-03-09 ENCOUNTER — Ambulatory Visit (INDEPENDENT_AMBULATORY_CARE_PROVIDER_SITE_OTHER): Payer: Medicare Other | Admitting: Internal Medicine

## 2013-03-09 ENCOUNTER — Ambulatory Visit (INDEPENDENT_AMBULATORY_CARE_PROVIDER_SITE_OTHER)
Admission: RE | Admit: 2013-03-09 | Discharge: 2013-03-09 | Disposition: A | Discharge status: Home / Self Care | Payer: Medicare Other | Source: Ambulatory Visit | Attending: Internal Medicine | Admitting: Internal Medicine

## 2013-03-09 ENCOUNTER — Encounter: Payer: Self-pay | Admitting: Internal Medicine

## 2013-03-09 VITALS — BP 111/75 | HR 90 | Temp 98.5°F | Wt 119.0 lb

## 2013-03-09 DIAGNOSIS — R634 Abnormal weight loss: Secondary | ICD-10-CM

## 2013-03-09 DIAGNOSIS — I6350 Cerebral infarction due to unspecified occlusion or stenosis of unspecified cerebral artery: Secondary | ICD-10-CM

## 2013-03-09 DIAGNOSIS — M899 Disorder of bone, unspecified: Secondary | ICD-10-CM

## 2013-03-09 DIAGNOSIS — Z0000 Encounter for general adult medical examination without abnormal findings: Secondary | ICD-10-CM

## 2013-03-09 DIAGNOSIS — E785 Hyperlipidemia, unspecified: Secondary | ICD-10-CM

## 2013-03-09 LAB — CBC WITH DIFFERENTIAL/PLATELET
Basophils Absolute: 0 10*3/uL (ref 0.0–0.1)
Eosinophils Relative: 1.4 % (ref 0.0–5.0)
HCT: 39.5 % (ref 36.0–46.0)
Hemoglobin: 12.9 g/dL (ref 12.0–15.0)
Lymphs Abs: 1.7 10*3/uL (ref 0.7–4.0)
MCV: 87.1 fl (ref 78.0–100.0)
Monocytes Absolute: 0.5 10*3/uL (ref 0.1–1.0)
Monocytes Relative: 8 % (ref 3.0–12.0)
Neutro Abs: 3.7 10*3/uL (ref 1.4–7.7)
RDW: 14.3 % (ref 11.5–14.6)

## 2013-03-09 LAB — COMPREHENSIVE METABOLIC PANEL
ALT: 12 U/L (ref 0–35)
Alkaline Phosphatase: 42 U/L (ref 39–117)
CO2: 37 mEq/L — ABNORMAL HIGH (ref 19–32)
Creatinine, Ser: 0.6 mg/dL (ref 0.4–1.2)
GFR: 109.36 mL/min (ref >60.00)
Total Bilirubin: 0.2 mg/dL — ABNORMAL LOW (ref 0.3–1.2)

## 2013-03-09 LAB — LIPID PANEL
Cholesterol: 165 mg/dL (ref 0–200)
HDL: 74.1 mg/dL (ref >39.00)
LDL Cholesterol: 79 mg/dL (ref 0–99)
Triglycerides: 59 mg/dL (ref 0.0–149.0)
VLDL: 11.8 mg/dL (ref 0.0–40.0)

## 2013-03-09 MED ORDER — SIMVASTATIN 40 MG PO TABS: ORAL_TABLET | ORAL | Status: DC

## 2013-03-09 MED ORDER — CLOPIDOGREL BISULFATE 75 MG PO TABS: ORAL_TABLET | ORAL | Status: DC

## 2013-03-09 NOTE — Progress Notes (Signed)
Subjective:    Patient ID: Helen Scott, female    DOB: [birth year], 62 y.o.   MRN: [id number]  HPI  Here for Medicare AWV:   1. Risk factors based on Past M, S, F history: reviewed   2. Physical Activities: walks 1 mile a day, does weights, exercises 5 times a week   3. Depression/mood: Neg screening 4. Hearing: No problems noted or reported   5. ADL's: Independent, still drives   6. Fall Risk: fall prevention   7. home Safety: does feel safe at home   8. Height, weight, &visual acuity: see VS, vision normal, sees eye doctor q 2 years 9. Counseling: provided   10. Labs ordered based on risk factors: if needed   11. Referral Coordination: if needed   12. Care Plan, see assessment and plan   13. Cognitive Assessment: Cognition and motor skills appropriate for age  In addition, today we discussed the following: COPD--good medication compliance, actually she feels better than last year, uses albuterol or DuoNeb once a month at most High cholesterol--good medication compliance Osteopenia--good compliance with Boniva, calcium, vitamin D and exercise History of CVA--on Plavix Has noted weight loss, has not changed her diet, appetite is good, admits to some stress but no depression. She has not increased her physical activity.  Past Medical History  Diagnosis Date  . PUD (peptic ulcer disease)      s/p partial gastrectomy 1985.    Marland Kitchen CVA (cerebral infarction) 06/1999  . Emphysema     FEV1 0.49, FEV1% 27 on 5/10  . Hyperlipidemia   . OSTEOPENIA 03/27/2009    Qualifier: Diagnosis of  By: Drue Novel MD, Nolon Rod.     Past Surgical History  Procedure Laterality Date  . Partial gastrectomy       s/p partial gastrectomy 1985.    . Breast biopsy  80s    R breast   . Tubal ligation  1980s   History   Social History  . Marital Status: Single    Spouse Name: N/A    Number of Children: 2  . Years of Education: N/A   Occupational History  . disability,previously worked in Clinical biochemist.     Social History Main Topics  . Smoking status: Former Smoker -- 1.50 packs/day for 40 years    Types: Cigarettes    Quit date: 05/25/2005  . Smokeless tobacco: Never Used     Comment: 1 ppd started at age 66  . Alcohol Use: No     Comment: rarely  . Drug Use: Not on file  . Sexual Activity: Not on file   Other Topics Concern  . Not on file   Social History Narrative   one living child, lost one   Single, lives by self   Family History  Problem Relation Age of Onset  . Diabetes Father 2  . Heart disease Father     MI at age 15s  . Breast cancer Maternal Aunt     aunt, great aunt  and 2 first cousins  . Hypertension Neg Hx   . Stroke Neg Hx   . Colon cancer Neg Hx   . Prostate cancer Father     dx in his 81s    Review of Systems Diet-- healthy No  CP, occ  lower extremity edema (was on prednisone) Mild orthopnea , uses 2-3 pillows Denies  nausea, vomiting diarrhea Denies  blood in the stools No GERD  Sx. (-)hemoptysis No dysuria, gross hematuria, difficulty urinating  Objective:   Physical Exam  BP 111/75  Pulse 90  Temp(Src) 98.5 F (36.9 C)  Wt 119 lb (53.978 kg)  BMI 21.09 kg/m2  SpO2 93% General -- alert, well-developed, NAD.  Neck --no thyromegaly , normal carotid pulse, no LAD or neck mass  HEENT-- ? Jaundice.  Lungs -- On portable oxygen, no increased work of breathing, BS decreased, scattered wheezing. Heart-- normal rate, regular rhythm, no murmur.  Abdomen-- Not distended, good bowel sounds,soft, non-tender.No mass or bruit Extremities-- no pretibial edema bilaterally  Neurologic--  alert & oriented X3. Speech normal, gait normal, strength normal in all extremities.  Psych-- Cognition and judgment appear intact. Cooperative with normal attention span and concentration. No anxious appearing , no depressed appearing.      Assessment & Plan:

## 2013-03-09 NOTE — Assessment & Plan Note (Signed)
Td 2008, pneumonia shot 01-2009, shingles shot 2011, had a  flu shot already   Female care per gyn , had a recent Pap smear which was abnormal, status post a biopsy 2 days ago. MMG done elsewhere  cscope   done @ [facility]  9- 2010, neg, next 10 years   diet and exercise -- doing great

## 2013-03-09 NOTE — Assessment & Plan Note (Addendum)
Patient reports unexplained weight loss, She was 131 pounds 6 months ago, today's 119 pounds. no change her lifestyle, if anything she is exercising less. No depression, review of systems negative for red flag symptoms. Wt loss may be due to to emphysema. Jaundice on exam?   Plan: TSH, chest x-ray, general labs, reassess in 3 months.

## 2013-03-09 NOTE — Patient Instructions (Signed)
Get your blood work before you leave  Next visit in  3 months   for a check up  Please get your x-ray at the other [place]  office located at: [address] [place], across from [facility].  Please go to the basement, this is a walk-in facility, they are open from 8:30 to 5:30 PM. Phone number [phone].  Fall Prevention and Home Safety Falls cause injuries and can affect all age groups. It is possible to use preventive measures to significantly decrease the likelihood of falls. There are many simple measures which can make your home safer and prevent falls. OUTDOORS  Repair cracks and edges of walkways and driveways.  Remove high doorway thresholds.  Trim shrubbery on the main path into your home.  Have good outside lighting.  Clear walkways of tools, rocks, debris, and clutter.  Check that handrails are not broken and are securely fastened. Both sides of steps should have handrails.  Have leaves, snow, and ice cleared regularly.  Use sand or salt on walkways during winter months.  In the garage, clean up grease or oil spills. BATHROOM  Install night lights.  Install grab bars by the toilet and in the tub and shower.  Use non-skid mats or decals in the tub or shower.  Place a plastic non-slip stool in the shower to sit on, if needed.  Keep floors dry and clean up all water on the floor immediately.  Remove soap buildup in the tub or shower on a regular basis.  Secure bath mats with non-slip, double-sided rug tape.  Remove throw rugs and tripping hazards from the floors. BEDROOMS  Install night lights.  Make sure a bedside light is easy to reach.  Do not use oversized bedding.  Keep a telephone by your bedside.  Have a firm chair with side arms to use for getting dressed.  Remove throw rugs and tripping hazards from the floor. KITCHEN  Keep handles on pots and pans turned toward the center of the stove. Use back burners when possible.  Clean up  spills quickly and allow time for drying.  Avoid walking on wet floors.  Avoid hot utensils and knives.  Position shelves so they are not too high or low.  Place commonly used objects within easy reach.  If necessary, use a sturdy step stool with a grab bar when reaching.  Keep electrical cables out of the way.  Do not use floor polish or wax that makes floors slippery. If you must use wax, use non-skid floor wax.  Remove throw rugs and tripping hazards from the floor. STAIRWAYS  Never leave objects on stairs.  Place handrails on both sides of stairways and use them. Fix any loose handrails. Make sure handrails on both sides of the stairways are as long as the stairs.  Check carpeting to make sure it is firmly attached along stairs. Make repairs to worn or loose carpet promptly.  Avoid placing throw rugs at the top or bottom of stairways, or properly secure the rug with carpet tape to prevent slippage. Get rid of throw rugs, if possible.  Have an electrician put in a light switch at the top and bottom of the stairs. OTHER FALL PREVENTION TIPS  Wear low-heel or rubber-soled shoes that are supportive and fit well. Wear closed toe shoes.  When using a stepladder, make sure it is fully opened and both spreaders are firmly locked. Do not climb a closed stepladder.  Add color or contrast paint or  tape to grab bars and handrails in your home. Place contrasting color strips on first and last steps.  Learn and use mobility aids as needed. Install an electrical emergency response system.  Turn on lights to avoid dark areas. Replace light bulbs that burn out immediately. Get light switches that glow.  Arrange furniture to create clear pathways. Keep furniture in the same place.  Firmly attach carpet with non-skid or double-sided tape.  Eliminate uneven floor surfaces.  Select a carpet pattern that does not visually hide the edge of steps.  Be aware of all pets. OTHER HOME SAFETY  TIPS  Set the water temperature for 120 F (48.8 C).  Keep emergency numbers on or near the telephone.  Keep smoke detectors on every level of the home and near sleeping areas. Document Released: 05/01/2002 Document Revised: 11/10/2011 Document Reviewed: 07/31/2011 [facility] Patient Information 2014 [place], [place].

## 2013-03-09 NOTE — Assessment & Plan Note (Signed)
Due for labs, good medication compliance 

## 2013-03-09 NOTE — Assessment & Plan Note (Signed)
On Plavix, controlling cardiovascular risk factors 

## 2013-03-09 NOTE — Assessment & Plan Note (Addendum)
Good compliance with medications, gynecology ordered a DEXA, done few days ago, results not available yet. addendum Bone density test performed a 03/07/2013 showed a T score of -2.7, not statistically different  from bone density test 2012. Plan: Continue Boniva, calcium and vitamin D. JP 03-22-13

## 2013-03-13 ENCOUNTER — Encounter: Payer: Self-pay | Admitting: *Deleted

## 2013-03-27 ENCOUNTER — Other Ambulatory Visit: Payer: Self-pay | Admitting: Internal Medicine

## 2013-03-27 NOTE — Telephone Encounter (Signed)
Simvastatin refill sent to pharmacy 

## 2013-06-09 ENCOUNTER — Encounter: Payer: Self-pay | Admitting: Internal Medicine

## 2013-06-09 ENCOUNTER — Ambulatory Visit (INDEPENDENT_AMBULATORY_CARE_PROVIDER_SITE_OTHER): Payer: Medicare Other | Admitting: Internal Medicine

## 2013-06-09 VITALS — BP 113/73 | HR 100 | Temp 97.9°F | Wt 116.0 lb

## 2013-06-09 DIAGNOSIS — R634 Abnormal weight loss: Secondary | ICD-10-CM

## 2013-06-09 DIAGNOSIS — I6350 Cerebral infarction due to unspecified occlusion or stenosis of unspecified cerebral artery: Secondary | ICD-10-CM

## 2013-06-09 DIAGNOSIS — R609 Edema, unspecified: Secondary | ICD-10-CM

## 2013-06-09 DIAGNOSIS — J449 Chronic obstructive pulmonary disease, unspecified: Secondary | ICD-10-CM

## 2013-06-09 NOTE — Assessment & Plan Note (Signed)
Controlling  cardiovascular risk factors

## 2013-06-09 NOTE — Assessment & Plan Note (Addendum)
Stable, had al appropriate shots . Paper work for tax exclusion signed, the patient is totally disabled due to COPD

## 2013-06-09 NOTE — Patient Instructions (Signed)
   Next visit is for routine check up regards COPD cholesterol in 6 month No need to come back fasting Please make an appointment

## 2013-06-09 NOTE — Progress Notes (Signed)
   Subjective:    Patient ID: Helen Scott, female    DOB: Oct 10, 1950, 63 y.o.   MRN: [id number]  HPI Routine office visit Good compliance with medication, her weight at home is a stable, in general feels well.   Past Medical History  Diagnosis Date  . PUD (peptic ulcer disease)      s/p partial gastrectomy 1985.    Marland Kitchen CVA (cerebral infarction) 06/1999  . Emphysema     FEV1 0.49, FEV1% 27 on 5/10  . Hyperlipidemia   . OSTEOPENIA 03/27/2009    Qualifier: Diagnosis of  By: Larose Kells MD, [place]     Past Surgical History  Procedure Laterality Date  . Partial gastrectomy       s/p partial gastrectomy 1985.    . Breast biopsy  80s    R breast   . Tubal ligation  1980s     Review of Systems Denies chest pain, difficulty breathing at baseline Occasionally has coughing the mornings, no hemoptysis. Denies nausea, vomiting or diarrhea    Objective:   Physical Exam  BP 113/73  Pulse 100  Temp(Src) 97.9 F (36.6 C)  Wt 116 lb (52.617 kg)  SpO2 94% General -- alert, well-developed, NAD.    Lungs -- decreased breath sounds throughout, no wheezes today Heart-- normal rate, distant heart sounds   Extremities-- no pretibial edema bilaterally  Neurologic--  alert & oriented X3.   Psych-- Cognition and judgment appear intact. Cooperative with normal attention span and concentration. No anxious or depressed appearing.        Assessment & Plan:

## 2013-06-09 NOTE — Assessment & Plan Note (Signed)
Labs and x-rays were okay, weight has plateau , related to COPD?. Plan: Observation, good nutrition discussed

## 2013-06-09 NOTE — Progress Notes (Signed)
Pre visit review using our clinic review tool, if applicable. No additional management support is needed unless otherwise documented below in the visit note. 

## 2013-06-09 NOTE — Assessment & Plan Note (Signed)
Not an issue at this time

## 2013-06-12 ENCOUNTER — Telehealth: Payer: Self-pay | Admitting: Internal Medicine

## 2013-06-12 NOTE — Telephone Encounter (Signed)
Relevant patient education mailed to patient.  

## 2013-07-24 ENCOUNTER — Other Ambulatory Visit: Payer: Self-pay | Admitting: Pulmonary Disease

## 2013-08-02 ENCOUNTER — Encounter: Payer: Self-pay | Admitting: Pulmonary Disease

## 2013-08-02 ENCOUNTER — Ambulatory Visit (INDEPENDENT_AMBULATORY_CARE_PROVIDER_SITE_OTHER): Payer: Medicare Other | Admitting: Pulmonary Disease

## 2013-08-02 VITALS — BP 122/68 | HR 116 | Temp 98.3°F | Ht 63.0 in | Wt 118.4 lb

## 2013-08-02 DIAGNOSIS — J449 Chronic obstructive pulmonary disease, unspecified: Secondary | ICD-10-CM

## 2013-08-02 DIAGNOSIS — J9610 Chronic respiratory failure, unspecified whether with hypoxia or hypercapnia: Secondary | ICD-10-CM

## 2013-08-02 MED ORDER — ALBUTEROL SULFATE (2.5 MG/3ML) 0.083% IN NEBU: INHALATION_SOLUTION | RESPIRATORY_TRACT | Status: DC

## 2013-08-02 NOTE — Patient Instructions (Signed)
Eat anything you want to get calories in. Keep working on exercise program. No change in medications, except to change your neb treatments to albuterol 2.5 (or 3) by HHN q6 hr prn instead of duoneb.  followup with me again in 56mos, but call if having breathing issues.

## 2013-08-02 NOTE — Progress Notes (Signed)
   Subjective:    Patient ID: Helen Scott, female    DOB: 25-Jul-1950, 63 y.o.   MRN: [id number]  HPI Patient comes in today for followup of her known COPD with chronic respiratory failure. She's been staying very active, although the cold weather has kept her from doing as much as she normally does. She feels that her breathing is back to normal baseline since the weather has warmed up, and has not had a chest infection or acute exacerbation since the last visit. 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 I have stressed to her the importance of aggressive calorie intake. She had a chest x-ray the end of last year that did not show acute process.   Review of Systems  Constitutional: Negative for fever and unexpected weight change.  HENT: Negative for congestion, dental problem, ear pain, nosebleeds, postnasal drip, rhinorrhea, sinus pressure, sneezing, sore throat and trouble swallowing.   Eyes: Negative for redness and itching.  Respiratory: Positive for shortness of breath. Negative for cough, chest tightness and wheezing.   Cardiovascular: Negative for palpitations and leg swelling.  Gastrointestinal: Negative for nausea and vomiting.  Genitourinary: Negative for dysuria.  Musculoskeletal: Negative for joint swelling.  Skin: Negative for rash.  Neurological: Negative for headaches.  Hematological: Does not bruise/bleed easily.  Psychiatric/Behavioral: Negative for dysphoric mood. The patient is not nervous/anxious.        Objective:   Physical Exam Thin female in no acute distress Nose without purulence or discharge noted Neck without lymphadenopathy or thyromegaly Chest with very diminished breath sounds, but no definite wheezes Cardiac exam with distant heart sounds, but is regular Lower extremities without edema, no cyanosis Alert and oriented, moves all 4 extremities.       Assessment & Plan:

## 2013-08-02 NOTE — Assessment & Plan Note (Signed)
The patient appears to be doing fairly well from a COPD standpoint. She is back to her usual baseline since the weather has warmed up, and has not had an acute exacerbation. I have asked her to continue on her current regimen, keep working on her exercise program, and to the is many calories as possible to maintain weight. At some point, she may need referral back to the 2 transplant program for further evaluation.

## 2013-08-04 ENCOUNTER — Telehealth: Payer: Self-pay | Admitting: Pulmonary Disease

## 2013-08-04 MED ORDER — ALBUTEROL SULFATE (2.5 MG/3ML) 0.083% IN NEBU: INHALATION_SOLUTION | RESPIRATORY_TRACT | Status: DC

## 2013-08-04 MED ORDER — ALBUTEROL SULFATE (2.5 MG/3ML) 0.083% IN NEBU
INHALATION_SOLUTION | RESPIRATORY_TRACT | Status: DC
Start: 2013-08-04 — End: 2013-08-10

## 2013-08-04 NOTE — Telephone Encounter (Signed)
A new rx will have to be sent to Butch Penny at [facility] with dx code. This has been done. Nothing further is needed.

## 2013-08-08 ENCOUNTER — Telehealth: Payer: Self-pay | Admitting: Pulmonary Disease

## 2013-08-08 NOTE — Telephone Encounter (Signed)
Spoke with pt. She reports we sent over RX for [facility]erol [facility] to [facility].  I advised will call [facility] Medicare is requiring a DWO form. This is going to be faxed to triage. Will await fax

## 2013-08-09 NOTE — Telephone Encounter (Signed)
done

## 2013-08-09 NOTE — Telephone Encounter (Signed)
Fax has been received and placed in [facility] look at. Please advise once done so we can fax it. thanks

## 2013-08-10 ENCOUNTER — Telehealth: Payer: Self-pay | Admitting: Pulmonary Disease

## 2013-08-10 MED ORDER — ALBUTEROL SULFATE (2.5 MG/3ML) 0.083% IN NEBU: INHALATION_SOLUTION | RESPIRATORY_TRACT | Status: DC

## 2013-08-10 NOTE — Telephone Encounter (Signed)
Spoke with Butch Penny at [facility]. Insurance is denying dx code of 496. I have sent in rx with dx code of 518.83. Butch Penny will let us know if that code doesn't work. Nothing further is needed.

## 2013-08-11 ENCOUNTER — Telehealth: Payer: Self-pay | Admitting: Pulmonary Disease

## 2013-08-11 MED ORDER — ALBUTEROL SULFATE (2.5 MG/3ML) 0.083% IN NEBU: INHALATION_SOLUTION | RESPIRATORY_TRACT | Status: DC

## 2013-08-11 NOTE — Telephone Encounter (Signed)
Called spoke with [facility]. He reports we sent in RX for #120 ML of albuterol neb. They can't split up box. Wants okay to give #150 ML. I advised this was fine. Nothing further needed

## 2013-08-23 ENCOUNTER — Other Ambulatory Visit: Payer: Self-pay | Admitting: Internal Medicine

## 2013-08-23 ENCOUNTER — Other Ambulatory Visit: Payer: Self-pay | Admitting: Pulmonary Disease

## 2013-08-24 ENCOUNTER — Telehealth: Payer: Self-pay | Admitting: Pulmonary Disease

## 2013-08-24 NOTE — Telephone Encounter (Signed)
Pt aware that refill has been sent to pharmacy as requested.

## 2013-09-21 ENCOUNTER — Telehealth: Payer: Self-pay | Admitting: Pulmonary Disease

## 2013-09-21 MED ORDER — ALBUTEROL SULFATE HFA 108 (90 BASE) MCG/ACT IN AERS: 1.0000 | INHALATION_SPRAY | RESPIRATORY_TRACT | Status: DC | PRN

## 2013-09-21 NOTE — Telephone Encounter (Signed)
Spoke with pt. Aware rx has been sent. Nothing further needed

## 2013-11-20 ENCOUNTER — Other Ambulatory Visit: Payer: Self-pay | Admitting: Pulmonary Disease

## 2013-12-08 ENCOUNTER — Ambulatory Visit (INDEPENDENT_AMBULATORY_CARE_PROVIDER_SITE_OTHER): Payer: Medicare Other | Admitting: Internal Medicine

## 2013-12-08 ENCOUNTER — Encounter: Payer: Self-pay | Admitting: Internal Medicine

## 2013-12-08 VITALS — BP 102/70 | HR 120 | Temp 97.8°F | Wt 117.0 lb

## 2013-12-08 DIAGNOSIS — J4489 Other specified chronic obstructive pulmonary disease: Secondary | ICD-10-CM

## 2013-12-08 DIAGNOSIS — I6350 Cerebral infarction due to unspecified occlusion or stenosis of unspecified cerebral artery: Secondary | ICD-10-CM

## 2013-12-08 DIAGNOSIS — J449 Chronic obstructive pulmonary disease, unspecified: Secondary | ICD-10-CM

## 2013-12-08 DIAGNOSIS — R634 Abnormal weight loss: Secondary | ICD-10-CM

## 2013-12-08 DIAGNOSIS — R609 Edema, unspecified: Secondary | ICD-10-CM

## 2013-12-08 DIAGNOSIS — Z23 Encounter for immunization: Secondary | ICD-10-CM

## 2013-12-08 NOTE — Assessment & Plan Note (Signed)
Complained of easy bruising, on exam she has evidence of capillary fragility. This was discussed with the patient, recommend observation, cont plavix; she does not have blood in the urine or stools

## 2013-12-08 NOTE — Patient Instructions (Addendum)
  Next visit is for a physical exam in approximately 4 months  , fasting Please make an appointment

## 2013-12-08 NOTE — Assessment & Plan Note (Signed)
Seems to be doing very well, recommend to continue current medications and exercising daily. Prevnar provided

## 2013-12-08 NOTE — Assessment & Plan Note (Signed)
Not an issueAt this time

## 2013-12-08 NOTE — Assessment & Plan Note (Signed)
Weight has stabilized at 117 pounds

## 2013-12-08 NOTE — Progress Notes (Signed)
Pre visit review using our clinic review tool, if applicable. No additional management support is needed unless otherwise documented below in the visit note. 

## 2013-12-08 NOTE — Progress Notes (Signed)
Subjective:    Patient ID: Helen Scott, female    DOB: 06-May-1951, 63 y.o.   MRN: [id number]  DOS:  12/08/2013 Type of visit - description:  routine History: In general feeling well. Was concerned about weight loss before but  weight is stable COPD, good compliance with medication, not having major problems, unable to go outside in warm weather. C/o  easy bruising. Medication list reviewed, good compliance. Labs reviewed, not due for any labs today.   ROS Denies nausea, vomiting, diarrhea. No blood in the stools or in the urine. No lower extremity edema She remains very active and exercises almost daily at the [facility]  Past Medical History  Diagnosis Date  . PUD (peptic ulcer disease)      s/p partial gastrectomy 1985.    Marland Kitchen CVA (cerebral infarction) 06/1999  . Emphysema     FEV1 0.49, FEV1% 27 on 5/10  . Hyperlipidemia   . OSTEOPENIA 03/27/2009    Qualifier: Diagnosis of  By: Larose Kells MD, Padroni      Past Surgical History  Procedure Laterality Date  . Partial gastrectomy       s/p partial gastrectomy 1985.    . Breast biopsy  80s    R breast   . Tubal ligation  1980s    History   Social History  . Marital Status: Single    Spouse Name: N/A    Number of Children: 2  . Years of Education: N/A   Occupational History  . disability,previously worked in Therapist, art.    Social History Main Topics  . Smoking status: Former Smoker -- 1.50 packs/day for 40 years    Types: Cigarettes    Quit date: 05/25/2005  . Smokeless tobacco: Never Used     Comment: 1 ppd started at age 65  . Alcohol Use: No     Comment: rarely  . Drug Use: Not on file  . Sexual Activity: Not on file   Other Topics Concern  . Not on file   Social History Narrative   one living child, lost one   Single, lives by self        Medication List       This list is accurate as of: 12/08/13 11:59 PM.  Always use your most recent med list.               albuterol (2.5  MG/3ML) 0.083% nebulizer solution  Commonly known as:  PROVENTIL  Use 2.5mg /88mL by nebulizer every 6 hours prn     albuterol 108 (90 BASE) MCG/ACT inhaler  Commonly known as:  VENTOLIN HFA  Inhale 1-2 puffs into the lungs every 4 (four) hours as needed for wheezing. 4-6 hours as needed.     CALCIUM-CARB 600 + D 600-125 MG-UNIT Tabs  Generic drug:  Calcium Carbonate-Vitamin D  Take 1 tablet by mouth 2 (two) times daily.     clopidogrel 75 MG tablet  Commonly known as:  PLAVIX  TAKE 1 TABLET BY MOUTH EVERY DAY     DULERA 100-5 MCG/ACT Aero  Generic drug:  mometasone-formoterol  INHALE 2 PUFFS INTO THE LUNGS TWICE DAILY     ibandronate 150 MG tablet  Commonly known as:  BONIVA  TAKE 1 TABLET BY MOUTH EVERY MONTH WITH A FULL GLASS OF WATER     simvastatin 40 MG tablet  Commonly known as:  ZOCOR  TAKE 1 TABLET BY MOUTH AT BEDTIME     SPIRIVA HANDIHALER 18 MCG  inhalation capsule  Generic drug:  tiotropium  INHALE THE CONTENTS OF 1 CAPSULE USING HANDIHALER DAILY     Vitamin D3 1000 UNITS Caps  Take 1 capsule by mouth daily.           Objective:   Physical Exam BP 102/70  Pulse 120  Temp(Src) 97.8 F (36.6 C)  Wt 117 lb (53.071 kg)  SpO2 88%  General -- alert, well-developed, NAD.   Lungs -- normal respiratory effort, no intercostal retractions, no accessory muscle use, decreased  breath sounds and exp wheezes.  Heart-- tachy, no murmur.  Extremities-- no pretibial edema bilaterally  Neurologic--  alert & oriented X3.   Psych-- Cognition and judgment appear intact. Cooperative with normal attention span and concentration. No anxious or depressed appearing.       Assessment & Plan:

## 2014-01-22 ENCOUNTER — Other Ambulatory Visit: Payer: Self-pay | Admitting: Pulmonary Disease

## 2014-02-02 ENCOUNTER — Ambulatory Visit (INDEPENDENT_AMBULATORY_CARE_PROVIDER_SITE_OTHER): Payer: Medicare Other | Admitting: Pulmonary Disease

## 2014-02-02 ENCOUNTER — Encounter: Payer: Self-pay | Admitting: Pulmonary Disease

## 2014-02-02 VITALS — BP 116/62 | HR 110 | Temp 98.4°F | Ht 63.0 in | Wt 118.6 lb

## 2014-02-02 DIAGNOSIS — J4489 Other specified chronic obstructive pulmonary disease: Secondary | ICD-10-CM

## 2014-02-02 DIAGNOSIS — R0902 Hypoxemia: Secondary | ICD-10-CM

## 2014-02-02 DIAGNOSIS — J9611 Chronic respiratory failure with hypoxia: Secondary | ICD-10-CM

## 2014-02-02 DIAGNOSIS — J449 Chronic obstructive pulmonary disease, unspecified: Secondary | ICD-10-CM

## 2014-02-02 DIAGNOSIS — J9610 Chronic respiratory failure, unspecified whether with hypoxia or hypercapnia: Secondary | ICD-10-CM

## 2014-02-02 DIAGNOSIS — Z23 Encounter for immunization: Secondary | ICD-10-CM

## 2014-02-02 NOTE — Patient Instructions (Signed)
No change in current medications, but can change you to nebs only if you feel your inhaler are not getting to the right place. Let us know if you feel you are getting sick Continue to stay active as able. followup with me again in 36mos.

## 2014-02-02 NOTE — Progress Notes (Signed)
   Subjective:    Patient ID: Helen Scott, female    DOB: Apr 30, 1951, 63 y.o.   MRN: [id number]  HPI Patient comes in today for followup of her known severe COPD with chronic respiratory failure. She has done fairly well since last visit, with no recent pulmonary infection or acute exacerbation. She has had a little more cough and nasal symptoms recently, but no obvious purulence. She is unsure if she is getting a chest cold. She feels her breathing is near her usual baseline.   Review of Systems  Constitutional: Negative for fever and unexpected weight change.  HENT: Negative for congestion, dental problem, ear pain, nosebleeds, postnasal drip, rhinorrhea, sinus pressure, sneezing, sore throat and trouble swallowing.   Eyes: Negative for redness and itching.  Respiratory: Positive for cough and shortness of breath. Negative for chest tightness and wheezing.   Cardiovascular: Negative for palpitations and leg swelling.  Gastrointestinal: Negative for nausea and vomiting.  Genitourinary: Negative for dysuria.  Musculoskeletal: Negative for joint swelling.  Skin: Negative for rash.  Neurological: Negative for headaches.  Hematological: Does not bruise/bleed easily.  Psychiatric/Behavioral: Negative for dysphoric mood. The patient is not nervous/anxious.        Objective:   Physical Exam Well-developed female in no acute distress Nose without purulence or discharge noted Neck without lymphadenopathy or thyromegaly Chest with very diminished breath sounds throughout, no active wheezing Cardiac exam with regular rate and rhythm Lower extremities without edema, no cyanosis Alert and oriented, moves all 4 extremities.       Assessment & Plan:

## 2014-02-02 NOTE — Assessment & Plan Note (Signed)
The pt is near her usual baseline from a breathing standpoint.  She has not had any recent pulmonary infection, nor acute exacerbation.  We discussed going on long acting nebulized BD today, and will hold off a little longer.  I have also asked her to keep working on pulmonary rehab.

## 2014-02-13 ENCOUNTER — Telehealth: Payer: Self-pay | Admitting: Pulmonary Disease

## 2014-02-13 NOTE — Telephone Encounter (Signed)
I called spoke with pt. Made her aware we do not carry the high dose flu shot. She needed nothing further

## 2014-02-14 ENCOUNTER — Other Ambulatory Visit: Payer: Self-pay | Admitting: Pulmonary Disease

## 2014-03-27 ENCOUNTER — Encounter: Payer: Medicare Other | Admitting: Internal Medicine

## 2014-03-27 ENCOUNTER — Other Ambulatory Visit: Payer: Self-pay | Admitting: Internal Medicine

## 2014-03-30 ENCOUNTER — Encounter: Payer: Self-pay | Admitting: Internal Medicine

## 2014-03-30 ENCOUNTER — Ambulatory Visit (INDEPENDENT_AMBULATORY_CARE_PROVIDER_SITE_OTHER): Payer: Medicare Other | Admitting: Internal Medicine

## 2014-03-30 VITALS — BP 122/62 | HR 99 | Temp 98.3°F | Wt 117.2 lb

## 2014-03-30 DIAGNOSIS — E785 Hyperlipidemia, unspecified: Secondary | ICD-10-CM

## 2014-03-30 DIAGNOSIS — M810 Age-related osteoporosis without current pathological fracture: Secondary | ICD-10-CM

## 2014-03-30 DIAGNOSIS — Z8673 Personal history of transient ischemic attack (TIA), and cerebral infarction without residual deficits: Secondary | ICD-10-CM

## 2014-03-30 DIAGNOSIS — J441 Chronic obstructive pulmonary disease with (acute) exacerbation: Secondary | ICD-10-CM

## 2014-03-30 DIAGNOSIS — J438 Other emphysema: Secondary | ICD-10-CM

## 2014-03-30 DIAGNOSIS — Z0000 Encounter for general adult medical examination without abnormal findings: Secondary | ICD-10-CM

## 2014-03-30 LAB — CBC WITH DIFFERENTIAL/PLATELET
Basophils Absolute: 0 10*3/uL (ref 0.0–0.1)
Basophils Relative: 0.3 % (ref 0.0–3.0)
EOS PCT: 1.5 % (ref 0.0–5.0)
Eosinophils Absolute: 0.1 10*3/uL (ref 0.0–0.7)
HEMATOCRIT: 40.9 % (ref 36.0–46.0)
HEMOGLOBIN: 13.1 g/dL (ref 12.0–15.0)
LYMPHS ABS: 1.5 10*3/uL (ref 0.7–4.0)
Lymphocytes Relative: 20.8 % (ref 12.0–46.0)
MCHC: 32.1 g/dL (ref 30.0–36.0)
MCV: 90.2 fl (ref 78.0–100.0)
MONO ABS: 0.5 10*3/uL (ref 0.1–1.0)
MONOS PCT: 6.4 % (ref 3.0–12.0)
NEUTROS ABS: 5.2 10*3/uL (ref 1.4–7.7)
Neutrophils Relative %: 71 % (ref 43.0–77.0)
Platelets: 204 10*3/uL (ref 150.0–400.0)
RBC: 4.53 Mil/uL (ref 3.87–5.11)
RDW: 13.9 % (ref 11.5–15.5)
WBC: 7.4 10*3/uL (ref 4.0–10.5)

## 2014-03-30 LAB — LIPID PANEL
Cholesterol: 167 mg/dL (ref 0–200)
HDL: 60 mg/dL (ref >39.00)
LDL CALC: 98 mg/dL (ref 0–99)
NONHDL: 107
Total CHOL/HDL Ratio: 3
Triglycerides: 45 mg/dL (ref 0.0–149.0)
VLDL: 9 mg/dL (ref 0.0–40.0)

## 2014-03-30 LAB — COMPREHENSIVE METABOLIC PANEL
ALK PHOS: 46 U/L (ref 39–117)
ALT: 14 U/L (ref 0–35)
AST: 23 U/L (ref 0–37)
Albumin: 3.5 g/dL (ref 3.5–5.2)
BUN: 9 mg/dL (ref 6–23)
CO2: 39 meq/L — AB (ref 19–32)
CREATININE: 0.6 mg/dL (ref 0.4–1.2)
Calcium: 9.1 mg/dL (ref 8.4–10.5)
Chloride: 97 mEq/L (ref 96–112)
GFR: 101.05 mL/min (ref >60.00)
Glucose, Bld: 86 mg/dL (ref 70–99)
Potassium: 4.1 mEq/L (ref 3.5–5.1)
Sodium: 140 mEq/L (ref 135–145)
Total Bilirubin: 0.4 mg/dL (ref 0.2–1.2)
Total Protein: 7.3 g/dL (ref 6.0–8.3)

## 2014-03-30 NOTE — Patient Instructions (Signed)
Get your blood work before you leave   Two very good sites to explore about end of life care :  www.begintheconversation.org  theconversationproject.org     Please come back to the office in 6 months  for a routine check up , no  fasting       Fall Prevention and Home Safety Falls cause injuries and can affect all age groups. It is possible to use preventive measures to significantly decrease the likelihood of falls. There are many simple measures which can make your home safer and prevent falls. OUTDOORS  Repair cracks and edges of walkways and driveways.  Remove high doorway thresholds.  Trim shrubbery on the main path into your home.  Have good outside lighting.  Clear walkways of tools, rocks, debris, and clutter.  Check that handrails are not broken and are securely fastened. Both sides of steps should have handrails.  Have leaves, snow, and ice cleared regularly.  Use sand or salt on walkways during winter months.  In the garage, clean up grease or oil spills. BATHROOM  Install night lights.  Install grab bars by the toilet and in the tub and shower.  Use non-skid mats or decals in the tub or shower.  Place a plastic non-slip stool in the shower to sit on, if needed.  Keep floors dry and clean up all water on the floor immediately.  Remove soap buildup in the tub or shower on a regular basis.  Secure bath mats with non-slip, double-sided rug tape.  Remove throw rugs and tripping hazards from the floors. BEDROOMS  Install night lights.  Make sure a bedside light is easy to reach.  Do not use oversized bedding.  Keep a telephone by your bedside.  Have a firm chair with side arms to use for getting dressed.  Remove throw rugs and tripping hazards from the floor. KITCHEN  Keep handles on pots and pans turned toward the center of the stove. Use back burners when possible.  Clean up spills quickly and allow time for drying.  Avoid walking on wet  floors.  Avoid hot utensils and knives.  Position shelves so they are not too high or low.  Place commonly used objects within easy reach.  If necessary, use a sturdy step stool with a grab bar when reaching.  Keep electrical cables out of the way.  Do not use floor polish or wax that makes floors slippery. If you must use wax, use non-skid floor wax.  Remove throw rugs and tripping hazards from the floor. STAIRWAYS  Never leave objects on stairs.  Place handrails on both sides of stairways and use them. Fix any loose handrails. Make sure handrails on both sides of the stairways are as long as the stairs.  Check carpeting to make sure it is firmly attached along stairs. Make repairs to worn or loose carpet promptly.  Avoid placing throw rugs at the top or bottom of stairways, or properly secure the rug with carpet tape to prevent slippage. Get rid of throw rugs, if possible.  Have an electrician put in a light switch at the top and bottom of the stairs. OTHER FALL PREVENTION TIPS  Wear low-heel or rubber-soled shoes that are supportive and fit well. Wear closed toe shoes.  When using a stepladder, make sure it is fully opened and both spreaders are firmly locked. Do not climb a closed stepladder.  Add color or contrast paint or tape to grab bars and handrails in your home. Place contrasting  color strips on first and last steps.  Learn and use mobility aids as needed. Install an electrical emergency response system.  Turn on lights to avoid dark areas. Replace light bulbs that burn out immediately. Get light switches that glow.  Arrange furniture to create clear pathways. Keep furniture in the same place.  Firmly attach carpet with non-skid or double-sided tape.  Eliminate uneven floor surfaces.  Select a carpet pattern that does not visually hide the edge of steps.  Be aware of all pets. OTHER HOME SAFETY TIPS  Set the water temperature for 120 F (48.8 C).  Keep  emergency numbers on or near the telephone.  Keep smoke detectors on every level of the home and near sleeping areas. Document Released: 05/01/2002 Document Revised: 11/10/2011 Document Reviewed: 07/31/2011 [facility] Patient Information 2015 [place], [place]. This information is not intended to replace advice given to you by your health care provider. Make sure you discuss any questions you have with your health care provider.      Preventive Care for Adults  .   Ages 27 years and over  Blood pressure check.** / Every 1 to 2 years.  Lipid and cholesterol check.** / Every 5 years beginning at age 22 years.  Lung cancer screening. / Every year if you are aged 51-80 years and have a 30-pack-year history of smoking and currently smoke or have quit within the past 15 years. Yearly screening is stopped once you have quit smoking for at least 15 years or develop a health problem that would prevent you from having lung cancer treatment.  Clinical breast exam.** / Every year after age 80 years.  BRCA-related cancer risk assessment.** / For women who have family members with a BRCA-related cancer (breast, ovarian, tubal, or peritoneal cancers).  Mammogram.** / Every year beginning at age 37 years and continuing for as long as you are in good health. Consult with your health care provider.  Pap test.** / Every 3 years starting at age 19 years through age 23 or 42 years with 3 consecutive normal Pap tests. Testing can be stopped between 65 and 70 years with 3 consecutive normal Pap tests and no abnormal Pap or HPV tests in the past 10 years.  HPV screening.** / Every 3 years from ages 79 years through ages 38 or 68 years with a history of 3 consecutive normal Pap tests. Testing can be stopped between 65 and 70 years with 3 consecutive normal Pap tests and no abnormal Pap or HPV tests in the past 10 years.  Fecal occult blood test (FOBT) of stool. / Every year beginning at age 5 years and  continuing until age 56 years. You may not need to do this test if you get a colonoscopy every 10 years.  Flexible sigmoidoscopy or colonoscopy.** / Every 5 years for a flexible sigmoidoscopy or every 10 years for a colonoscopy beginning at age 95 years and continuing until age 11 years.  Hepatitis C blood test.** / For all people born from 50 through 1965 and any individual with known risks for hepatitis C.  Osteoporosis screening.** / A one-time screening for women ages 82 years and over and women at risk for fractures or osteoporosis.  Skin self-exam. / Monthly.  Influenza vaccine. / Every year.  Tetanus, diphtheria, and acellular pertussis (Tdap/Td) vaccine.** / 1 dose of Td every 10 years.  Varicella vaccine.** / Consult your health care provider.  Zoster vaccine.** / 1 dose for adults aged 20 years or older.  Pneumococcal 13-valent conjugate (PCV13) vaccine.** / Consult your health care provider.  Pneumococcal polysaccharide (PPSV23) vaccine.** / 1 dose for all adults aged 29 years and older.  Meningococcal vaccine.** / Consult your health care provider.  Hepatitis A vaccine.** / Consult your health care provider.  Hepatitis B vaccine.** / Consult your health care provider.  Haemophilus influenzae type b (Hib) vaccine.** / Consult your health care provider. ** Family history and personal history of risk and conditions may change your health care provider's recommendations. Document Released: 07/07/2001 Document Revised: 09/25/2013 Document Reviewed: 10/06/2010 [facility] Patient Information 2015 [place], [place]. This information is not intended to replace advice given to you by your health care provider. Make sure you discuss any questions you have with your health care provider.

## 2014-03-30 NOTE — Assessment & Plan Note (Addendum)
See previous entry, on Boniva since 2012, planning to recommend a holiday by 2017. Continue calcium, vitamin D and stay active

## 2014-03-30 NOTE — Assessment & Plan Note (Signed)
Stable, continue with present care

## 2014-03-30 NOTE — Progress Notes (Signed)
Pre visit review using our clinic review tool, if applicable. No additional management support is needed unless otherwise documented below in the visit note. 

## 2014-03-30 NOTE — Assessment & Plan Note (Signed)
On Plavix, controlling cardiovascular risk factors

## 2014-03-30 NOTE — Assessment & Plan Note (Addendum)
Td 2008, pneumonia shot 01-2009, shingles shot 2011, prevnar 2015, had a  flu shot already  Female care per gyn , had a   Pap ~ 2014,   was abnormal, status post a biopsy (was told ok), next visit w/ gyn 2016  MMG done elsewhere  cscope   done @ [facility]  9- 2010, neg, next 10 years   diet and exercise -- doing great  End-of-life comfort discussed, recommend to discuss with family, see instructions

## 2014-03-30 NOTE — Progress Notes (Signed)
Subjective:    Patient ID: Helen Scott, female    DOB: 13-Jul-1950, 63 y.o.   MRN: [id number]  DOS:  03/30/2014 Type of visit - description :   1. Risk factors based on Past M, S, F history: reviewed   2. Physical Activities: walks 1 mile a day,  exercises 3-4 times a week   3. Depression/mood: Neg screening 4. Hearing: No problems noted or reported   5. ADL's: Independent, still drives   6. Fall Risk: no recent fall , prevention  discussed 7. home Safety: does feel safe at home   8. Height, weight, &visual acuity: see VS, vision normal, saw the eye doctor June 2015 9. Counseling: provided   10. Labs ordered based on risk factors: if needed   11. Referral Coordination: if needed   12. Care Plan, see assessment and plan   13. Cognitive Assessment: Cognition and motor skills appropriate for age  In addition, today we discussed the following: Copd-- good compliance of medication, saw pulmonary recently felt to be stable. High cholesterol--on medications, no apparent side effects. Osteoporosis--good compliance with Boniva, calcium and vitamin D. She remains active History of a stroke--on Plavix   ROS Denies fever chills. No lower extremity edema No nausea, vomiting, diarrhea blood in the stools. No hemoptysis No dysuria gross hematuria or difficulty urinating, no vaginal bleeding/discharge  Past Medical History  Diagnosis Date  . PUD (peptic ulcer disease)      s/p partial gastrectomy 1985.    Marland Kitchen CVA (cerebral infarction) 06/1999  . Emphysema     FEV1 0.49, FEV1% 27 on 5/10  . Hyperlipidemia   . OSTEOPENIA 03/27/2009    Past Surgical History  Procedure Laterality Date  . Partial gastrectomy       s/p partial gastrectomy 1985.    . Breast biopsy  80s    R breast   . Tubal ligation  1980s    History   Social History  . Marital Status: Single    Spouse Name: N/A    Number of Children: 2  . Years of Education: N/A   Occupational History  . disability,previously  worked in Therapist, art.    Social History Main Topics  . Smoking status: Former Smoker -- 1.50 packs/day for 40 years    Types: Cigarettes    Quit date: 05/25/2005  . Smokeless tobacco: Never Used     Comment: 1 ppd started at age 62  . Alcohol Use: No     Comment: rarely  . Drug Use: No  . Sexual Activity: Not on file   Other Topics Concern  . Not on file   Social History Narrative   one living child, Blair Promise, lives in [place] one.    Single, lives by self     Family History  Problem Relation Age of Onset  . Diabetes Father 37  . Heart disease Father     MI at age 73s  . Breast cancer Maternal Aunt     aunt, great aunt  and 2 first cousins  . Hypertension Neg Hx   . Stroke Neg Hx   . Colon cancer Neg Hx   . Prostate cancer Father     dx in his 29s   '    Medication List       This list is accurate as of: 03/30/14  6:16 PM.  Always use your most recent med list.  albuterol (2.5 MG/3ML) 0.083% nebulizer solution  Commonly known as:  PROVENTIL  Use 2.5mg /39mL by nebulizer every 6 hours prn     albuterol 108 (90 BASE) MCG/ACT inhaler  Commonly known as:  VENTOLIN HFA  Inhale 1-2 puffs into the lungs every 4 (four) hours as needed for wheezing. 4-6 hours as needed.     CALCIUM-CARB 600 + D 600-125 MG-UNIT Tabs  Generic drug:  Calcium Carbonate-Vitamin D  Take 1 tablet by mouth 2 (two) times daily.     clopidogrel 75 MG tablet  Commonly known as:  PLAVIX  TAKE 1 TABLET BY MOUTH EVERY DAY     DULERA 100-5 MCG/ACT Aero  Generic drug:  mometasone-formoterol  INHALE 2 PUFFS INTO THE LUNGS BY MOUTH TWICE DAILY     ibandronate 150 MG tablet  Commonly known as:  BONIVA  TAKE 1 TABLET BY MOUTH EVERY MONTH WITH A FULL GLASS OF WATER     simvastatin 40 MG tablet  Commonly known as:  ZOCOR  TAKE 1 TABLET BY MOUTH EVERY NIGHT AT BEDTIME     SPIRIVA HANDIHALER 18 MCG inhalation capsule  Generic drug:  tiotropium  INHALE THE CONTENTS  OF 1 CAPSULE, USING THE HANDIHALER, ONCE DAILY     Vitamin D3 1000 UNITS Caps  Take 1 capsule by mouth daily.           Objective:   Physical Exam BP 122/62 mmHg  Pulse 99  Temp(Src) 98.3 F (36.8 C) (Oral)  Wt 117 lb 4 oz (53.184 kg)  SpO2 95% General -- alert, well-developed, mils distress, on portable O2.  Neck --no thyromegaly , normal carotid pulse  HEENT-- Not pale.  Lungs -- normal respiratory effort, no intercostal retractions, no accessory muscle use, and decreased breath sounds.  Heart-- normal rate, regular rhythm, no murmur.  Abdomen-- Not distended, good bowel sounds,soft, non-tender. No bruit or  Mass  Extremities-- no pretibial edema bilaterally  Neurologic--  alert & oriented X3. Speech normal, gait appropriate for age, strength symmetric and appropriate for age.  Psych-- Cognition and judgment appear intact. Cooperative with normal attention span and concentration. No anxious or depressed appearing.       Assessment & Plan:

## 2014-03-30 NOTE — Assessment & Plan Note (Signed)
Continue simvastatin, check labs

## 2014-04-10 ENCOUNTER — Encounter: Payer: Medicare Other | Admitting: Internal Medicine

## 2014-05-17 ENCOUNTER — Telehealth: Payer: Self-pay | Admitting: Pulmonary Disease

## 2014-05-17 MED ORDER — LEVOFLOXACIN 750 MG PO TABS: 750.0000 mg | ORAL_TABLET | Freq: Every day | ORAL | Status: DC

## 2014-05-17 NOTE — Telephone Encounter (Signed)
Called and spoke to pt. Pt c/o increase in SOB, prod cough with brown-milky mucus, chest congestion, chest soreness secondary to cough, sinus pressure and congestion x 2 days. Pt stated she has an oral temperature of 100 and is taking tylenol regularly. Pt last seen on 02/02/14 by [facility].   [place] please advise.   No Known Allergies

## 2014-05-17 NOTE — Telephone Encounter (Signed)
Need to call in levaquin 750mg  one a day for 7 days.  If she is not getting better, needs to let us know.

## 2014-05-17 NOTE — Telephone Encounter (Signed)
Called and spoke to pt. Informed pt of the recs per [facility]. Rx sent to preferred pharmacy. Pt verbalized understanding and denied any further questions or concerns at this time.

## 2014-05-21 ENCOUNTER — Other Ambulatory Visit: Payer: Self-pay | Admitting: Pulmonary Disease

## 2014-05-25 ENCOUNTER — Other Ambulatory Visit: Payer: Self-pay | Admitting: Internal Medicine

## 2014-08-03 ENCOUNTER — Ambulatory Visit: Payer: Medicare Other | Admitting: Pulmonary Disease

## 2014-08-08 ENCOUNTER — Ambulatory Visit (INDEPENDENT_AMBULATORY_CARE_PROVIDER_SITE_OTHER): Payer: Medicare Other | Admitting: Pulmonary Disease

## 2014-08-08 ENCOUNTER — Encounter: Payer: Self-pay | Admitting: Pulmonary Disease

## 2014-08-08 VITALS — BP 114/64 | HR 84 | Temp 98.1°F | Ht 63.0 in | Wt 113.8 lb

## 2014-08-08 DIAGNOSIS — J438 Other emphysema: Secondary | ICD-10-CM

## 2014-08-08 DIAGNOSIS — J9611 Chronic respiratory failure with hypoxia: Secondary | ICD-10-CM

## 2014-08-08 DIAGNOSIS — J441 Chronic obstructive pulmonary disease with (acute) exacerbation: Secondary | ICD-10-CM

## 2014-08-08 MED ORDER — DOXYCYCLINE HYCLATE 100 MG PO TABS: 100.0000 mg | ORAL_TABLET | Freq: Two times a day (BID) | ORAL | Status: DC

## 2014-08-08 MED ORDER — PREDNISONE 10 MG PO TABS
ORAL_TABLET | ORAL | Status: DC
Start: 2014-08-08 — End: 2014-08-29

## 2014-08-08 NOTE — Assessment & Plan Note (Signed)
I suspect the patient is having a COPD exacerbation that is related to the weather change and perhaps significant allergies.  I would like to treat her with an 8 day course of prednisone which should help with her airway symptoms and also with her upper airway symptoms associated with allergy. I will give her a prescription for an antibiotic to hold, but she is not to take this unless she develops increased quantity of mucus and more chest congestion.

## 2014-08-08 NOTE — Progress Notes (Signed)
   Subjective:    Patient ID: Helen Scott, female    DOB: 1951/03/29, 64 y.o.   MRN: [id number]  HPI The patient comes in today for follow-up of her known COPD with chronic respiratory failure. She has done fairly well until one week ago when she began to develop increasing shortness of breath, along with increased allergy symptoms. She now has significant nasal congestion and postnasal drip which is making her feel more short of breath. She denies chest congestion, and is not having increased quantity of mucus above her usual baseline. She does not feel that she has a chest cold at this time.   Review of Systems  Constitutional: Negative for fever, chills and unexpected weight change.  HENT: Positive for congestion and postnasal drip. Negative for dental problem, ear pain, nosebleeds, rhinorrhea, sinus pressure, sneezing, sore throat, trouble swallowing and voice change.   Eyes: Negative for redness, itching and visual disturbance.  Respiratory: Positive for cough, chest tightness, shortness of breath and wheezing. Negative for choking.   Cardiovascular: Negative for chest pain, palpitations and leg swelling.  Gastrointestinal: Negative for nausea, vomiting, abdominal pain and diarrhea.  Genitourinary: Negative for dysuria and difficulty urinating.  Musculoskeletal: Negative for joint swelling and arthralgias.  Skin: Negative for rash.  Neurological: Negative for tremors, syncope and headaches.  Hematological: Does not bruise/bleed easily.  Psychiatric/Behavioral: Negative for dysphoric mood. The patient is not nervous/anxious.        Objective:   Physical Exam Well-developed female in no acute distress Nose without purulence or discharge noted Neck without lymphadenopathy or thyromegaly Chest with very diminished breath sounds, but no wheezing Cardiac exam with distant heart sounds but regular Lower extremities without edema, no cyanosis Alert and oriented, moves all 4  extremities.       Assessment & Plan:

## 2014-08-08 NOTE — Patient Instructions (Signed)
Will treat you with an 8 day course of prednisone. Try zyrtec 10mg  one a day until allergy symptoms are better. Will give you a prescription for doxycycline 100mg , one am and pm for 5 days.  Do not fill unless you start having more chest congestion and increased quantity of mucus.

## 2014-08-21 ENCOUNTER — Other Ambulatory Visit: Payer: Self-pay | Admitting: Pulmonary Disease

## 2014-08-29 ENCOUNTER — Other Ambulatory Visit: Payer: Self-pay

## 2014-09-26 ENCOUNTER — Other Ambulatory Visit: Payer: Self-pay

## 2014-09-27 ENCOUNTER — Other Ambulatory Visit: Payer: Self-pay | Admitting: Internal Medicine

## 2014-10-02 ENCOUNTER — Other Ambulatory Visit: Payer: Self-pay

## 2014-10-02 ENCOUNTER — Ambulatory Visit (INDEPENDENT_AMBULATORY_CARE_PROVIDER_SITE_OTHER): Payer: Medicare Other | Admitting: Internal Medicine

## 2014-10-02 ENCOUNTER — Encounter: Payer: Self-pay | Admitting: Internal Medicine

## 2014-10-02 VITALS — BP 124/76 | HR 116 | Temp 98.2°F | Ht 63.0 in | Wt 112.5 lb

## 2014-10-02 DIAGNOSIS — D698 Other specified hemorrhagic conditions: Secondary | ICD-10-CM

## 2014-10-02 DIAGNOSIS — F32A Depression, unspecified: Secondary | ICD-10-CM

## 2014-10-02 DIAGNOSIS — F329 Major depressive disorder, single episode, unspecified: Secondary | ICD-10-CM | POA: Diagnosis not present

## 2014-10-02 MED ORDER — IBANDRONATE SODIUM 150 MG PO TABS: 150.0000 mg | ORAL_TABLET | ORAL | Status: DC

## 2014-10-02 MED ORDER — CLOPIDOGREL BISULFATE 75 MG PO TABS: 75.0000 mg | ORAL_TABLET | Freq: Every day | ORAL | Status: DC

## 2014-10-02 NOTE — Patient Instructions (Signed)
Come back to the office in 6 months   for a physical exam  Please schedule an appointment at the front desk    Come back fasting      Think about visit one of our counselors

## 2014-10-02 NOTE — Progress Notes (Signed)
Pre visit review using our clinic review tool, if applicable. No additional management support is needed unless otherwise documented below in the visit note. 

## 2014-10-02 NOTE — Progress Notes (Signed)
Subjective:    Patient ID: Helen Scott, female    DOB: 1951-02-26, 64 y.o.   MRN: [id number]  DOS:  10/02/2014 Type of visit - description : rov Interval history: Medical history reviewed, good compliance, needs some refills Labs reviewed, not due for any blood work today. Reports that her vision is getting worse, last eye doctor visit 2 months ago. Complain of easy bruising in her arms, she actually bumped her right arm with a chair 3 weeks ago and development very dark and large bruise that is already getting better. Depression? She went to a wedding few weeks ago and she couldn't keep up with other people due to her emphysema, quite sad about it.    Review of Systems Denies any suicidal ideas. No blood in the stools or in the urine. No nausea, Vomiting, diarrhea.  Past Medical History  Diagnosis Date  . PUD (peptic ulcer disease)      s/p partial gastrectomy 1985.    Marland Kitchen CVA (cerebral infarction) 06/1999  . Emphysema     FEV1 0.49, FEV1% 27 on 5/10  . Hyperlipidemia   . OSTEOPENIA 03/27/2009    Past Surgical History  Procedure Laterality Date  . Partial gastrectomy       s/p partial gastrectomy 1985.    . Breast biopsy  80s    R breast   . Tubal ligation  1980s    History   Social History  . Marital Status: Single    Spouse Name: N/A  . Number of Children: 2  . Years of Education: N/A   Occupational History  . disability,previously worked in Therapist, art.    Social History Main Topics  . Smoking status: Former Smoker -- 1.50 packs/day for 40 years    Types: Cigarettes    Quit date: 05/25/2005  . Smokeless tobacco: Never Used     Comment: 1 ppd started at age 26  . Alcohol Use: No     Comment: rarely  . Drug Use: No  . Sexual Activity: Not on file   Other Topics Concern  . Not on file   Social History Narrative   one living child, Blair Promise, lives in [place].    Single, lives by self        Medication List       This list is  accurate as of: 10/02/14  1:38 PM.  Always use your most recent med list.               albuterol (2.5 MG/3ML) 0.083% nebulizer solution  Commonly known as:  PROVENTIL  Use 2.5mg /68mL by nebulizer every 6 hours prn     albuterol 108 (90 BASE) MCG/ACT inhaler  Commonly known as:  VENTOLIN HFA  Inhale 1-2 puffs into the lungs every 4 (four) hours as needed for wheezing. 4-6 hours as needed.     CALCIUM-CARB 600 + D 600-125 MG-UNIT Tabs  Generic drug:  Calcium Carbonate-Vitamin D  Take 1 tablet by mouth 2 (two) times daily.     clopidogrel 75 MG tablet  Commonly known as:  PLAVIX  TAKE 1 TABLET BY MOUTH EVERY DAY     DULERA 100-5 MCG/ACT Aero  Generic drug:  mometasone-formoterol  INHALE 2 PUFFS INTO THE LUNGS BY MOUTH TWICE DAILY     ibandronate 150 MG tablet  Commonly known as:  BONIVA  TAKE 1 TABLET BY MOUTH EVERY MONTH WITH A FULL GLASS OF WATER     simvastatin 40  MG tablet  Commonly known as:  ZOCOR  Take 1 tablet (40 mg total) by mouth at bedtime.     SPIRIVA HANDIHALER 18 MCG inhalation capsule  Generic drug:  tiotropium  INHALE THE CONTENTS OF 1 CAPSULE VIA HANDIHALER ONCE DAILY     Vitamin D3 1000 UNITS Caps  Take 1 capsule by mouth daily.           Objective:   Physical Exam  Skin:      BP 124/76 mmHg  Pulse 116  Temp(Src) 98.2 F (36.8 C) (Oral)  Ht 5\' 3"  (1.6 m)  Wt 112 lb 8 oz (51.03 kg)  BMI 19.93 kg/m2  SpO2 97%  General:   Well developed, well nourished . NAD.  HEENT:  Normocephalic . Face symmetric, atraumatic Lungs:  Severely decreased breath sounds bilaterally  Heart: RRR,  no murmur.  No pretibial edema bilaterally  Skin: Not pale. Not jaundice. Few lesions in the distal arms consistent with capillary fragility Neurologic:  alert & oriented X3.  Speech normal, gait appropriate for age and unassisted Psych--  Cognition and judgment appear intact.  Cooperative with normal attention span and concentration.  Tearful.  Mildly  anxious appearing.       Assessment & Plan:    Capillary fragility, recent hematoma right arm. Discuss of the patient with capillary fragility is, recommend observation, hematoma resolving.    High cholesterol, last FLP with well-controlled lipids.

## 2014-10-03 ENCOUNTER — Telehealth: Payer: Self-pay | Admitting: Pulmonary Disease

## 2014-10-03 DIAGNOSIS — F329 Major depressive disorder, single episode, unspecified: Secondary | ICD-10-CM | POA: Insufficient documentation

## 2014-10-03 DIAGNOSIS — F32A Depression, unspecified: Secondary | ICD-10-CM | POA: Insufficient documentation

## 2014-10-03 DIAGNOSIS — F419 Anxiety disorder, unspecified: Secondary | ICD-10-CM | POA: Insufficient documentation

## 2014-10-03 NOTE — Assessment & Plan Note (Signed)
Depression, new She does have some depression, we talk about treatment options including a support group for COPD, counseling and SSRIs. At this point she is electing counseling. Information provided, knows to call me at any time if she feels the need for  medication. Follow-up 6 months.

## 2014-10-03 NOTE — Telephone Encounter (Signed)
Forms were given to [facility] and she is aware that pt wants copies mailed to her after we fax

## 2014-10-03 NOTE — Telephone Encounter (Signed)
Forms dropped off and placed in Hand green folder. Please advise thanks

## 2014-10-03 NOTE — Telephone Encounter (Signed)
PATIENT IS IN THE LOBBY with paperwork and asking to speak with nurse.

## 2014-10-03 NOTE — Telephone Encounter (Signed)
Spoke with the pt  She states needing [place] to sign form for [employer] regarding her o2 use  I advised we will take of of this for her, just need to drop the forms off and we will let her know once completed  Will forward to Mindy to keep an eye out for these  Thanks

## 2014-10-03 NOTE — Telephone Encounter (Signed)
Forms signed by [facility]. I have faxed them and placed a copy in the mail to patient.

## 2014-10-08 ENCOUNTER — Telehealth: Payer: Self-pay | Admitting: Pulmonary Disease

## 2014-10-08 NOTE — Telephone Encounter (Signed)
Let pt know that I would like for her to see Dr. George Hugh, please make sure that her upcoming apptm is scheduled with him .

## 2014-10-08 NOTE — Telephone Encounter (Signed)
Pt states that her sister was seen today and she relayed a message to her from Dr Gwenette Greet asking that she call here. Pt aware that I will get this message over to Dr Gwenette Greet.  Pt states that she was told that Dr Gwenette Greet wants to speak to her about switching to another physician ( pt wishes to stay at [facility] location). I advised that I would get this message over to Dr Gwenette Greet.

## 2014-10-09 NOTE — Telephone Encounter (Signed)
Spoke with pt, scheduled for 6/10 with [facility] to establish care.  Nothing further needed.

## 2014-10-09 NOTE — Telephone Encounter (Signed)
atc pt X3, line busy.  Wcb.

## 2014-10-19 ENCOUNTER — Ambulatory Visit (INDEPENDENT_AMBULATORY_CARE_PROVIDER_SITE_OTHER): Payer: No Typology Code available for payment source | Admitting: Psychology

## 2014-10-19 DIAGNOSIS — F4323 Adjustment disorder with mixed anxiety and depressed mood: Secondary | ICD-10-CM | POA: Diagnosis not present

## 2014-11-02 ENCOUNTER — Encounter: Payer: Self-pay | Admitting: Pulmonary Disease

## 2014-11-02 ENCOUNTER — Ambulatory Visit (INDEPENDENT_AMBULATORY_CARE_PROVIDER_SITE_OTHER): Payer: Medicare Other | Admitting: Pulmonary Disease

## 2014-11-02 VITALS — BP 116/66 | HR 102 | Ht 63.0 in | Wt 114.0 lb

## 2014-11-02 DIAGNOSIS — J432 Centrilobular emphysema: Secondary | ICD-10-CM | POA: Diagnosis not present

## 2014-11-02 NOTE — Progress Notes (Signed)
Subjective:    Patient ID: Helen Scott, female    DOB: 08-03-1950, 64 y.o.   MRN: [id number]  Synopsis: Former [facility] patient with GOLD D COPD pfts 2010:  FEV1 0.49 (ratio 27%). O2 dependent Transplant eval at [facility]--told too good, 2010 Hospitalized 2009 for COPD exacerbation and started on 2 L O2 As of 2016 has been using 3LPM O2 since 2015   HPI Chief Complaint  Patient presents with  . Advice Only    Pt switching from [place] to [facility].  has no complaints at this time.     She has been doing well isnce her last visit.  She says that the weather has made her allergies flare up this year.  She started taking zyrtec which helps.  She says this week has been her best week in 6 months.  She coughs and produces mucus on a daily basis.  She takes Spiriva and Dulera regularly.  She uses 3Lpm pulse O2.  She rarely uses albuterol, maybe once per month (both nebulizer and HFA). She exercicses every day, about 3/4-1 mile 3 times per week.  She uses weights as well.  Past Medical History  Diagnosis Date  . PUD (peptic ulcer disease)      s/p partial gastrectomy 1985.    Marland Kitchen CVA (cerebral infarction) 06/1999  . Emphysema     FEV1 0.49, FEV1% 27 on 5/10  . Hyperlipidemia   . OSTEOPENIA 03/27/2009      Review of Systems  Constitutional: Negative for fever, chills and fatigue.  HENT: Negative for postnasal drip, rhinorrhea and sinus pressure.   Respiratory: Positive for cough and shortness of breath. Negative for wheezing.   Cardiovascular: Negative for chest pain, palpitations and leg swelling.       Objective:   Physical Exam  Filed Vitals:   11/02/14 1452  BP: 116/66  Pulse: 102  Height: 5\' 3"  (1.6 m)  Weight: 114 lb (51.71 kg)  SpO2: 98%   3L O2  Gen: well appearing HENT: OP clear, TM's clear, neck supple PULM: few crackles, limited air movement, no wheezing CV: RRR, no mgr, trace edema GI: BS+, soft, nontender Derm: no cyanosis or rash Psyche: normal mood and affect       Assessment & Plan:  COPD (chronic obstructive pulmonary disease) This has been a stable interval for patency. She remains quite active on a daily basis with regular walking. Her dyspnea is controlled. She has not had an exacerbation since the last visit. She has gold grade D disease but it is fairly well controlled right now.  Her Pneumovax and Prevnar are up to date.   Plan: Continue Dulera and Spiriva Continue regular exercise Flu shot in the fall Continue oxygen as prescribed     Current outpatient prescriptions:  .  albuterol (PROVENTIL) (2.5 MG/3ML) 0.083% nebulizer solution, Use 2.5mg /49mL by nebulizer every 6 hours prn, Disp: 150 mL, Rfl: 12 .  albuterol (VENTOLIN HFA) 108 (90 BASE) MCG/ACT inhaler, Inhale 1-2 puffs into the lungs every 4 (four) hours as needed for wheezing. 4-6 hours as needed., Disp: 1 Inhaler, Rfl: 4 .  Calcium Carbonate-Vitamin D (CALCIUM-CARB 600 + D) 600-125 MG-UNIT TABS, Take 1 tablet by mouth 2 (two) times daily.  , Disp: , Rfl:  .  Cholecalciferol (VITAMIN D3) 1000 UNITS CAPS, Take 1 capsule by mouth daily.  , Disp: , Rfl:  .  clopidogrel (PLAVIX) 75 MG tablet, Take 1 tablet (75 mg total) by mouth daily., Disp: 90 tablet, Rfl: 2 .  DULERA 100-5 MCG/ACT AERO, INHALE 2 PUFFS INTO THE LUNGS BY MOUTH TWICE DAILY, Disp: 39 g, Rfl: 2 .  ibandronate (BONIVA) 150 MG tablet, Take 1 tablet (150 mg total) by mouth every 30 (thirty) days. Take in the morning with a full glass of water, on an empty stomach, and do not take anything else by mouth or lie down for the next 30 min., Disp: 3 tablet, Rfl: 2 .  simvastatin (ZOCOR) 40 MG tablet, Take 1 tablet (40 mg total) by mouth at bedtime., Disp: 90 tablet, Rfl: 1 .  SPIRIVA HANDIHALER 18 MCG inhalation capsule, INHALE THE CONTENTS OF 1 CAPSULE VIA HANDIHALER ONCE DAILY, Disp: 90 capsule, Rfl: 1

## 2014-11-02 NOTE — Patient Instructions (Signed)
Keep taking your inhalers as prescribed Keep using your oxygen as you are doing Keep exercising regularly We will see you back in 6 months or sooner if needed

## 2014-11-02 NOTE — Assessment & Plan Note (Signed)
This has been a stable interval for patency. She remains quite active on a daily basis with regular walking. Her dyspnea is controlled. She has not had an exacerbation since the last visit. She has gold grade D disease but it is fairly well controlled right now.  Her Pneumovax and Prevnar are up to date.   Plan: Continue Dulera and Spiriva Continue regular exercise Flu shot in the fall Continue oxygen as prescribed

## 2014-11-07 ENCOUNTER — Ambulatory Visit (INDEPENDENT_AMBULATORY_CARE_PROVIDER_SITE_OTHER): Payer: No Typology Code available for payment source | Admitting: Psychology

## 2014-11-07 DIAGNOSIS — F4323 Adjustment disorder with mixed anxiety and depressed mood: Secondary | ICD-10-CM | POA: Diagnosis not present

## 2014-11-20 ENCOUNTER — Other Ambulatory Visit: Payer: Self-pay | Admitting: *Deleted

## 2014-11-20 MED ORDER — MOMETASONE FURO-FORMOTEROL FUM 100-5 MCG/ACT IN AERO: INHALATION_SPRAY | RESPIRATORY_TRACT | Status: DC

## 2014-11-28 ENCOUNTER — Ambulatory Visit: Payer: No Typology Code available for payment source | Admitting: Psychology

## 2014-12-12 ENCOUNTER — Ambulatory Visit (INDEPENDENT_AMBULATORY_CARE_PROVIDER_SITE_OTHER): Payer: No Typology Code available for payment source | Admitting: Psychology

## 2014-12-12 DIAGNOSIS — F4323 Adjustment disorder with mixed anxiety and depressed mood: Secondary | ICD-10-CM | POA: Diagnosis not present

## 2014-12-14 ENCOUNTER — Ambulatory Visit (INDEPENDENT_AMBULATORY_CARE_PROVIDER_SITE_OTHER): Payer: Medicare Other | Admitting: Internal Medicine

## 2014-12-14 ENCOUNTER — Encounter: Payer: Self-pay | Admitting: Internal Medicine

## 2014-12-14 VITALS — BP 122/66 | HR 110 | Temp 98.2°F | Ht 63.0 in | Wt 112.1 lb

## 2014-12-14 DIAGNOSIS — F32A Depression, unspecified: Secondary | ICD-10-CM

## 2014-12-14 DIAGNOSIS — F329 Major depressive disorder, single episode, unspecified: Secondary | ICD-10-CM

## 2014-12-14 DIAGNOSIS — E785 Hyperlipidemia, unspecified: Secondary | ICD-10-CM | POA: Diagnosis not present

## 2014-12-14 DIAGNOSIS — M810 Age-related osteoporosis without current pathological fracture: Secondary | ICD-10-CM | POA: Diagnosis not present

## 2014-12-14 MED ORDER — ALBUTEROL SULFATE (2.5 MG/3ML) 0.083% IN NEBU: 2.5000 mg | INHALATION_SOLUTION | Freq: Four times a day (QID) | RESPIRATORY_TRACT | Status: DC | PRN

## 2014-12-14 MED ORDER — ESCITALOPRAM OXALATE 10 MG PO TABS: 10.0000 mg | ORAL_TABLET | Freq: Every day | ORAL | Status: DC

## 2014-12-14 MED ORDER — SIMVASTATIN 40 MG PO TABS: 40.0000 mg | ORAL_TABLET | Freq: Every day | ORAL | Status: DC

## 2014-12-14 MED ORDER — ALBUTEROL SULFATE HFA 108 (90 BASE) MCG/ACT IN AERS: 1.0000 | INHALATION_SPRAY | RESPIRATORY_TRACT | Status: DC | PRN

## 2014-12-14 NOTE — Progress Notes (Signed)
Subjective:    Patient ID: Helen Scott, female    DOB: Mar 04, 1951, 64 y.o.   MRN: [id number]  DOS:  12/14/2014 Type of visit - description : Follow-up Interval history: Depression, she meet with Coralyn Mark our  counselor, they both agree that she would benefit from medications. COPD: needs refills Osteoporosis: Needs refills High cholesterol: Needs refills   Review of Systems Sleeps very well. Minimal anxiety, denies any suicidal ideas.   Past Medical History  Diagnosis Date  . PUD (peptic ulcer disease)      s/p partial gastrectomy 1985.    Marland Kitchen CVA (cerebral infarction) 06/1999  . Emphysema     FEV1 0.49, FEV1% 27 on 5/10  . Hyperlipidemia   . OSTEOPENIA 03/27/2009    Past Surgical History  Procedure Laterality Date  . Partial gastrectomy       s/p partial gastrectomy 1985.    . Breast biopsy  80s    R breast   . Tubal ligation  1980s    History   Social History  . Marital Status: Single    Spouse Name: N/A  . Number of Children: 2  . Years of Education: N/A   Occupational History  . disability,previously worked in Therapist, art.    Social History Main Topics  . Smoking status: Former Smoker -- 1.50 packs/day for 40 years    Types: Cigarettes    Quit date: 05/25/2005  . Smokeless tobacco: Never Used     Comment: 1 ppd started at age 61  . Alcohol Use: No     Comment: rarely  . Drug Use: No  . Sexual Activity: Not on file   Other Topics Concern  . Not on file   Social History Narrative   one living child, Blair Promise, lives in [place] one.    Single, lives by self        Medication List       This list is accurate as of: 12/14/14  8:41 PM.  Always use your most recent med list.               albuterol 108 (90 BASE) MCG/ACT inhaler  Commonly known as:  VENTOLIN HFA  Inhale 1-2 puffs into the lungs every 4 (four) hours as needed for wheezing. Every 4-6 hours as needed.     albuterol (2.5 MG/3ML) 0.083% nebulizer solution  Commonly  known as:  PROVENTIL  Take 3 mLs (2.5 mg total) by nebulization every 6 (six) hours as needed for wheezing or shortness of breath.     CALCIUM-CARB 600 + D 600-125 MG-UNIT Tabs  Generic drug:  Calcium Carbonate-Vitamin D  Take 1 tablet by mouth 2 (two) times daily.     clopidogrel 75 MG tablet  Commonly known as:  PLAVIX  Take 1 tablet (75 mg total) by mouth daily.     escitalopram 10 MG tablet  Commonly known as:  LEXAPRO  Take 1 tablet (10 mg total) by mouth daily.     ibandronate 150 MG tablet  Commonly known as:  BONIVA  Take 1 tablet (150 mg total) by mouth every 30 (thirty) days. Take in the morning with a full glass of water, on an empty stomach, and do not take anything else by mouth or lie down for the next 30 min.     mometasone-formoterol 100-5 MCG/ACT Aero  Commonly known as:  DULERA  INHALE 2 PUFFS INTO THE LUNGS BY MOUTH TWICE DAILY  simvastatin 40 MG tablet  Commonly known as:  ZOCOR  Take 1 tablet (40 mg total) by mouth at bedtime.     SPIRIVA HANDIHALER 18 MCG inhalation capsule  Generic drug:  tiotropium  INHALE THE CONTENTS OF 1 CAPSULE VIA HANDIHALER ONCE DAILY     Vitamin D3 1000 UNITS Caps  Take 1 capsule by mouth daily.           Objective:   Physical Exam BP 122/66 mmHg  Pulse 110  Temp(Src) 98.2 F (36.8 C) (Oral)  Ht 5\' 3"  (1.6 m)  Wt 112 lb 2 oz (50.86 kg)  BMI 19.87 kg/m2  SpO2 95% General:   Well developed, well nourished . NAD.  HEENT:  Normocephalic . Face symmetric, atraumatic  Skin: Not pale. Not jaundice Neurologic:  alert & oriented X3.  Speech normal, gait appropriate for age and unassisted Psych--  Cognition and judgment appear intact.  Cooperative with normal attention span and concentration.  Behavior appropriate. No anxious or depressed appearing.      Assessment & Plan:   COPD, at baseline, refill medications High cholesterol, last FLP satisfactory, refill Zocor. Osteoporosis, good compliance with Boniva,  refill medications

## 2014-12-14 NOTE — Patient Instructions (Signed)
Start lexapro The first two weeks take only 1/2 tablet at night Then start to take one full tablet

## 2014-12-14 NOTE — Assessment & Plan Note (Signed)
Continue with depression, undergoing psychotherapy. Has never taken any SSRI, different options discussed, I recommended Lexapro and she agrees. How to take it, side effects discussed. Patient will call if she is has side effects, follow-up in 2 months

## 2014-12-14 NOTE — Progress Notes (Signed)
Pre visit review using our clinic review tool, if applicable. No additional management support is needed unless otherwise documented below in the visit note. 

## 2015-01-09 ENCOUNTER — Ambulatory Visit (INDEPENDENT_AMBULATORY_CARE_PROVIDER_SITE_OTHER): Payer: No Typology Code available for payment source | Admitting: Psychology

## 2015-01-09 DIAGNOSIS — F4323 Adjustment disorder with mixed anxiety and depressed mood: Secondary | ICD-10-CM | POA: Diagnosis not present

## 2015-02-08 ENCOUNTER — Ambulatory Visit: Payer: Medicare Other | Admitting: Pulmonary Disease

## 2015-02-12 ENCOUNTER — Ambulatory Visit (INDEPENDENT_AMBULATORY_CARE_PROVIDER_SITE_OTHER): Payer: Medicare Other | Admitting: Internal Medicine

## 2015-02-12 ENCOUNTER — Encounter: Payer: Self-pay | Admitting: Internal Medicine

## 2015-02-12 VITALS — BP 124/72 | HR 108 | Temp 98.2°F | Ht 63.0 in | Wt 111.0 lb

## 2015-02-12 DIAGNOSIS — Z23 Encounter for immunization: Secondary | ICD-10-CM

## 2015-02-12 DIAGNOSIS — J441 Chronic obstructive pulmonary disease with (acute) exacerbation: Secondary | ICD-10-CM | POA: Diagnosis not present

## 2015-02-12 DIAGNOSIS — F329 Major depressive disorder, single episode, unspecified: Secondary | ICD-10-CM

## 2015-02-12 DIAGNOSIS — Z09 Encounter for follow-up examination after completed treatment for conditions other than malignant neoplasm: Secondary | ICD-10-CM | POA: Insufficient documentation

## 2015-02-12 DIAGNOSIS — F32A Depression, unspecified: Secondary | ICD-10-CM

## 2015-02-12 MED ORDER — CEFUROXIME AXETIL 500 MG PO TABS: 500.0000 mg | ORAL_TABLET | Freq: Two times a day (BID) | ORAL | Status: DC

## 2015-02-12 NOTE — Assessment & Plan Note (Signed)
Depression: Responding well to meds, decided to stay on Lexapro 5 mg only and does not feel she needs to increase the dose thus  continue with present care COPD: Definitely needs a flu shot, patient is somewhat concerned because she may be developing asthma exacerbation (see HPI) however she does not like to pass the  opportunity to take a flu shot. Plan: Flu shot today, treated early/mild exacerbation with antibiotics. See AVS.

## 2015-02-12 NOTE — Progress Notes (Signed)
Subjective:    Patient ID: Helen Scott, female    DOB: 1951-04-19, 64 y.o.   MRN: [id number]  DOS:  02/12/2015 Type of visit - description : Follow-up Interval history:  Depression, she is on Lexapro, decided to take only 5 mg daily, feeling better. Had one episode of a "panic attack", no ongoing anxiety, she took a extra half Lexapro and felt better.. In general she feels better, sleeping well at night. I recommend a flu shot, she agrees, at the same time in the last 3 days her cough has been slightly above baseline and the chest is slightly more congested. Mucus  production at baseline.   Review of Systems No fever chills. No sinus discharge or congestion No nausea, vomiting. No hemoptysis No suicidal ideas.    Past Medical History  Diagnosis Date  . PUD (peptic ulcer disease)      s/p partial gastrectomy 1985.    Marland Kitchen CVA (cerebral infarction) 06/1999  . Emphysema     FEV1 0.49, FEV1% 27 on 5/10  . Hyperlipidemia   . OSTEOPENIA 03/27/2009    Past Surgical History  Procedure Laterality Date  . Partial gastrectomy       s/p partial gastrectomy 1985.    . Breast biopsy  80s    R breast   . Tubal ligation  1980s    Social History   Social History  . Marital Status: Single    Spouse Name: N/A  . Number of Children: 2  . Years of Education: N/A   Occupational History  . disability,previously worked in Therapist, art.    Social History Main Topics  . Smoking status: Former Smoker -- 1.50 packs/day for 40 years    Types: Cigarettes    Quit date: 05/25/2005  . Smokeless tobacco: Never Used     Comment: 1 ppd started at age 32  . Alcohol Use: No     Comment: rarely  . Drug Use: No  . Sexual Activity: Not on file   Other Topics Concern  . Not on file   Social History Narrative   one living child, Blair Promise, lives in [place].    Single, lives by self        Medication List       This list is accurate as of: 02/12/15  5:36 PM.  Always use  your most recent med list.               albuterol 108 (90 BASE) MCG/ACT inhaler  Commonly known as:  VENTOLIN HFA  Inhale 1-2 puffs into the lungs every 4 (four) hours as needed for wheezing. Every 4-6 hours as needed.     albuterol (2.5 MG/3ML) 0.083% nebulizer solution  Commonly known as:  PROVENTIL  Take 3 mLs (2.5 mg total) by nebulization every 6 (six) hours as needed for wheezing or shortness of breath.     CALCIUM-CARB 600 + D 600-125 MG-UNIT Tabs  Generic drug:  Calcium Carbonate-Vitamin D  Take 1 tablet by mouth 2 (two) times daily.     cefUROXime 500 MG tablet  Commonly known as:  CEFTIN  Take 1 tablet (500 mg total) by mouth 2 (two) times daily with a meal.     clopidogrel 75 MG tablet  Commonly known as:  PLAVIX  Take 1 tablet (75 mg total) by mouth daily.     escitalopram 10 MG tablet  Commonly known as:  LEXAPRO  Take 5 mg by mouth  daily.     ibandronate 150 MG tablet  Commonly known as:  BONIVA  Take 1 tablet (150 mg total) by mouth every 30 (thirty) days. Take in the morning with a full glass of water, on an empty stomach, and do not take anything else by mouth or lie down for the next 30 min.     mometasone-formoterol 100-5 MCG/ACT Aero  Commonly known as:  DULERA  INHALE 2 PUFFS INTO THE LUNGS BY MOUTH TWICE DAILY     simvastatin 40 MG tablet  Commonly known as:  ZOCOR  Take 1 tablet (40 mg total) by mouth at bedtime.     SPIRIVA HANDIHALER 18 MCG inhalation capsule  Generic drug:  tiotropium  INHALE THE CONTENTS OF 1 CAPSULE VIA HANDIHALER ONCE DAILY     Vitamin D3 1000 UNITS Caps  Take 1 capsule by mouth daily.           Objective:   Physical Exam BP 124/72 mmHg  Pulse 108  Temp(Src) 98.2 F (36.8 C) (Oral)  Ht 5\' 3"  (1.6 m)  Wt 111 lb (50.349 kg)  BMI 19.67 kg/m2  SpO2 94% General:   Well developed, well nourished . NAD.  HEENT:  Normocephalic . Face symmetric, atraumatic Lungs:  Decreased breath sounds, no wheezing, + rhonchi  with cough. She has a portable oxygen and seems in general to be at baseline. Heart: Tachycardic,  no murmur.  No pretibial edema bilaterally  Skin: Not pale. Not jaundice Neurologic:  alert & oriented X3.  Speech normal, gait appropriate for age and unassisted Psych--  Cognition and judgment appear intact.  Cooperative with normal attention span and concentration.  Behavior appropriate. No anxious or depressed appearing.      Assessment & Plan:   Problem list> COPD Hyperlipidemia Depression w/o anxiety (occ panick episode) - started Lexapro 7-16 PUD -- partial gastrectomy 1985 Osteopenia H/o CVA 2001  A/P Depression: Responding well to meds, decided to stay on Lexapro 5 mg only and does not feel she needs to increase the dose thus  continue with present care COPD: Definitely needs a flu shot, patient is somewhat concerned because she may be developing asthma exacerbation (see HPI) however she does not like to pass the  opportunity to take a flu shot. Plan: Flu shot today, treated early/mild exacerbation with antibiotics. See AVS.

## 2015-02-12 NOTE — Patient Instructions (Signed)
For cough: Start antibiotics ---nCeftin for one week Mucinex DM twice a day until better Continue routine respiratory medicines Call anytime if the symptoms are worse, you have fever, chills, increased mucus production.

## 2015-02-12 NOTE — Progress Notes (Signed)
Pre visit review using our clinic review tool, if applicable. No additional management support is needed unless otherwise documented below in the visit note. 

## 2015-02-19 ENCOUNTER — Telehealth: Payer: Self-pay | Admitting: Pulmonary Disease

## 2015-02-19 MED ORDER — TIOTROPIUM BROMIDE MONOHYDRATE 18 MCG IN CAPS: ORAL_CAPSULE | RESPIRATORY_TRACT | Status: DC

## 2015-02-19 NOTE — Telephone Encounter (Signed)
Pt informed that refill for Spiriva was sent to pharmacy.

## 2015-02-27 ENCOUNTER — Ambulatory Visit (INDEPENDENT_AMBULATORY_CARE_PROVIDER_SITE_OTHER): Payer: No Typology Code available for payment source | Admitting: Psychology

## 2015-02-27 DIAGNOSIS — F4323 Adjustment disorder with mixed anxiety and depressed mood: Secondary | ICD-10-CM | POA: Diagnosis not present

## 2015-03-04 LAB — HM PAP SMEAR: HM PAP: NORMAL

## 2015-03-18 LAB — HM MAMMOGRAPHY: HM Mammogram: NORMAL

## 2015-03-18 LAB — HM DEXA SCAN: HM DEXA SCAN: NORMAL

## 2015-03-20 ENCOUNTER — Encounter: Payer: Self-pay | Admitting: Internal Medicine

## 2015-03-22 ENCOUNTER — Telehealth: Payer: Self-pay | Admitting: Internal Medicine

## 2015-03-22 NOTE — Telephone Encounter (Signed)
Re-routing

## 2015-03-22 NOTE — Telephone Encounter (Signed)
Prolia insurance verification started on online Portal. Awaiting insurance coverage determination before Pt contact. Prolia insurance verification request form sent for scanning.

## 2015-03-22 NOTE — Telephone Encounter (Signed)
Bone density test 03/18/2015: T score -2.6. Previous T score 2014 was -2.7. Has been on biphosphonates since ~ 2011. Plan: Recommend to switch to prolia, start process if pt agreeable

## 2015-03-25 NOTE — Telephone Encounter (Signed)
Received Summary of Benefits from Pt's insurance. Coverage available. Per coverage Pt may be required to pay $15 dollar co-pay if OV is billed, and 20 % administration fee if out of pocket has not been met. This is an estimate and do not know exact amount of out-of-pocket cost for Pt. Will call and discuss with Pt to see if she would like to start Prolia before ordering. Summary of benefits sent for scanning.

## 2015-03-25 NOTE — Telephone Encounter (Signed)
LMOM informing Pt to return call regarding bone density results.

## 2015-03-27 NOTE — Telephone Encounter (Signed)
Letter printed and mailed to Pt, with bone density results, and recommendations to stop Boniva and start Prolia, injectable every 6 months. Informed her she would be due for $15 dollar co-pay and 20 % of administration fee, which would be another $10 estimate. Informed her to call if she would like to proceed with Prolia.

## 2015-04-10 ENCOUNTER — Ambulatory Visit (INDEPENDENT_AMBULATORY_CARE_PROVIDER_SITE_OTHER): Payer: No Typology Code available for payment source | Admitting: Psychology

## 2015-04-10 DIAGNOSIS — F4323 Adjustment disorder with mixed anxiety and depressed mood: Secondary | ICD-10-CM | POA: Diagnosis not present

## 2015-04-16 ENCOUNTER — Encounter: Payer: Self-pay | Admitting: Behavioral Health

## 2015-04-16 ENCOUNTER — Telehealth: Payer: Self-pay | Admitting: Behavioral Health

## 2015-04-16 NOTE — Telephone Encounter (Signed)
Pre-Visit Call completed with patient and chart updated.   Pre-Visit Info documented in Specialty Comments under Snap[employer]ot.    

## 2015-04-17 ENCOUNTER — Ambulatory Visit (INDEPENDENT_AMBULATORY_CARE_PROVIDER_SITE_OTHER): Payer: Medicare Other | Admitting: Internal Medicine

## 2015-04-17 ENCOUNTER — Encounter: Payer: Self-pay | Admitting: Internal Medicine

## 2015-04-17 VITALS — BP 122/74 | HR 116 | Temp 97.5°F | Ht 63.0 in | Wt 109.5 lb

## 2015-04-17 DIAGNOSIS — R634 Abnormal weight loss: Secondary | ICD-10-CM

## 2015-04-17 DIAGNOSIS — Z09 Encounter for follow-up examination after completed treatment for conditions other than malignant neoplasm: Secondary | ICD-10-CM

## 2015-04-17 DIAGNOSIS — Z114 Encounter for screening for human immunodeficiency virus [HIV]: Secondary | ICD-10-CM

## 2015-04-17 DIAGNOSIS — Z903 Acquired absence of stomach [part of]: Secondary | ICD-10-CM

## 2015-04-17 DIAGNOSIS — E785 Hyperlipidemia, unspecified: Secondary | ICD-10-CM

## 2015-04-17 DIAGNOSIS — Z1159 Encounter for screening for other viral diseases: Secondary | ICD-10-CM

## 2015-04-17 DIAGNOSIS — Z0000 Encounter for general adult medical examination without abnormal findings: Secondary | ICD-10-CM | POA: Diagnosis not present

## 2015-04-17 LAB — TSH: TSH: 1.6 u[IU]/mL (ref 0.35–4.50)

## 2015-04-17 LAB — CBC WITH DIFFERENTIAL/PLATELET
BASOS PCT: 0.2 % (ref 0.0–3.0)
Basophils Absolute: 0 10*3/uL (ref 0.0–0.1)
EOS PCT: 0.9 % (ref 0.0–5.0)
Eosinophils Absolute: 0.1 10*3/uL (ref 0.0–0.7)
HEMATOCRIT: 41.5 % (ref 36.0–46.0)
HEMOGLOBIN: 13.4 g/dL (ref 12.0–15.0)
LYMPHS PCT: 11.9 % — AB (ref 12.0–46.0)
Lymphs Abs: 1 10*3/uL (ref 0.7–4.0)
MCHC: 32.2 g/dL (ref 30.0–36.0)
MCV: 89.1 fl (ref 78.0–100.0)
MONO ABS: 0.3 10*3/uL (ref 0.1–1.0)
Monocytes Relative: 4 % (ref 3.0–12.0)
NEUTROS ABS: 6.9 10*3/uL (ref 1.4–7.7)
Neutrophils Relative %: 83 % — ABNORMAL HIGH (ref 43.0–77.0)
PLATELETS: 192 10*3/uL (ref 150.0–400.0)
RBC: 4.66 Mil/uL (ref 3.87–5.11)
RDW: 13.7 % (ref 11.5–15.5)
WBC: 8.3 10*3/uL (ref 4.0–10.5)

## 2015-04-17 LAB — T4, FREE: Free T4: 0.8 ng/dL (ref 0.60–1.60)

## 2015-04-17 LAB — COMPREHENSIVE METABOLIC PANEL
ALT: 8 U/L (ref 0–35)
AST: 17 U/L (ref 0–37)
Albumin: 4.2 g/dL (ref 3.5–5.2)
Alkaline Phosphatase: 53 U/L (ref 39–117)
BUN: 13 mg/dL (ref 6–23)
CHLORIDE: 96 meq/L (ref 96–112)
CO2: 40 meq/L — AB (ref 19–32)
CREATININE: 0.56 mg/dL (ref 0.40–1.20)
Calcium: 9.9 mg/dL (ref 8.4–10.5)
GFR: 115.38 mL/min (ref >60.00)
GLUCOSE: 99 mg/dL (ref 70–99)
POTASSIUM: 4 meq/L (ref 3.5–5.1)
Sodium: 143 mEq/L (ref 135–145)
Total Bilirubin: 0.3 mg/dL (ref 0.2–1.2)
Total Protein: 7.3 g/dL (ref 6.0–8.3)

## 2015-04-17 LAB — VITAMIN B12: Vitamin B-12: 337 pg/mL (ref 211–911)

## 2015-04-17 LAB — LIPID PANEL
CHOL/HDL RATIO: 3
Cholesterol: 169 mg/dL (ref 0–200)
HDL: 65 mg/dL (ref >39.00)
LDL CALC: 94 mg/dL (ref 0–99)
NONHDL: 104.13
Triglycerides: 51 mg/dL (ref 0.0–149.0)
VLDL: 10.2 mg/dL (ref 0.0–40.0)

## 2015-04-17 LAB — T3, FREE: T3, Free: 3 pg/mL (ref 2.3–4.2)

## 2015-04-17 NOTE — Progress Notes (Signed)
Subjective:    Patient ID: Helen Scott, female    DOB: [birth year], 64 y.o.   MRN: [id number]  DOS:  04/17/2015 Type of visit - description :   Type of visit - description :    1. Risk factors based on Past M, S, F history: reviewed   2. Physical Activities: slt less active this year, still  exercises 3-4 times a week   3. Depression/mood: sx better on lexapro  4. Hearing: No problems noted or reported   5. ADL's: Independent, still drives   6. Fall Risk: no recent fall , prevention  discussed 7. home Safety: does feel safe at home   8. Height, weight, &visual acuity: see VS, vision normal, saw the eye doctor this year 9. Counseling: provided   10. Labs ordered based on risk factors: if needed   11. Referral Coordination: if needed   12. Care Plan, see assessment and plan   13. Cognitive Assessment: Cognition and motor skills appropriate for age 19. Providers list updated  15. End of life care discussed, plans to get a HC-POA, MOST form provided   In addition, today we discussed the following: COPD: Compliance with medications, symptoms are at baseline Has noted some weight loss, no obvious explanation. High cholesterol: good compliance with Zocor Osteoporosis: Bone density test results discussed.     Review of Systems Constitutional: No fever. No chills.  No unusual sweats  HEENT: No dental problems, no ear discharge, no facial swelling, no voice changes. No eye discharge, no eye  redness , no  intolerance to light   Respiratory: Respiratory symptoms at baseline, on home oxygen, + cough, occasional sputum production, greenish in color. No hemoptysis  Cardiovascular: No CP, no leg swelling , no  Palpitations  GI: no nausea, no vomiting, rarely has diarrhea diarrhea , no  abdominal pain.  No blood in the stools. No dysphagia, no odynophagia    Endocrine: No polyphagia, no polyuria , no polydipsia  GU: No dysuria, gross hematuria, difficulty urinating. No urinary  urgency, no frequency.  Musculoskeletal: No joint swellings or unusual aches or pains  Skin: No change in the color of the skin, palor , no  Rash  Allergic, immunologic: No environmental allergies , no  food allergies  Neurological: No dizziness no  syncope. No headaches. No diplopia, no slurred, no slurred speech, no motor deficits, no facial  Numbness  Hematological: No enlarged lymph nodes, no easy bruising , no unusual bleedings  Psychiatry: No suicidal ideas, no hallucinations, no beavior problems, no confusion.  No unusual/severe anxiety, no depression   Past Medical History  Diagnosis Date  . PUD (peptic ulcer disease)      s/p partial gastrectomy 1985.    Marland Kitchen CVA (cerebral infarction) 06/1999  . Emphysema     FEV1 0.49, FEV1% 27 on 5/10  . Hyperlipidemia   . OSTEOPENIA 03/27/2009    Past Surgical History  Procedure Laterality Date  . Partial gastrectomy       s/p partial gastrectomy 1985.    . Breast biopsy  80s    R breast   . Tubal ligation  1980s    Social History   Social History  . Marital Status: Single    Spouse Name: N/A  . Number of Children: 2  . Years of Education: N/A   Occupational History  . disability,previously worked in Therapist, art.    Social History Main Topics  . Smoking status: Former Smoker -- 1.50 packs/day for 40  years    Types: Cigarettes    Quit date: 05/25/2005  . Smokeless tobacco: Never Used     Comment: 1 ppd started at age 64  . Alcohol Use: No     Comment: rarely  . Drug Use: No  . Sexual Activity: Not on file   Other Topics Concern  . Not on file   Social History Narrative   one living child, Blair Promise, lives in [place].    Single, lives by self     Family History  Problem Relation Age of Onset  . Diabetes Father 20  . Heart disease Father     MI at age 74s  . Breast cancer Maternal Aunt     aunt, great aunt  and 2 first cousins  . Hypertension Neg Hx   . Stroke Neg Hx   . Colon cancer Neg  Hx   . Prostate cancer Father     dx in his 23s       Medication List       This list is accurate as of: 04/17/15 11:59 PM.  Always use your most recent med list.               albuterol 108 (90 BASE) MCG/ACT inhaler  Commonly known as:  VENTOLIN HFA  Inhale 1-2 puffs into the lungs every 4 (four) hours as needed for wheezing. Every 4-6 hours as needed.     albuterol (2.5 MG/3ML) 0.083% nebulizer solution  Commonly known as:  PROVENTIL  Take 3 mLs (2.5 mg total) by nebulization every 6 (six) hours as needed for wheezing or shortness of breath.     CALCIUM-CARB 600 + D 600-125 MG-UNIT Tabs  Generic drug:  Calcium Carbonate-Vitamin D  Take 1 tablet by mouth 2 (two) times daily.     clopidogrel 75 MG tablet  Commonly known as:  PLAVIX  Take 1 tablet (75 mg total) by mouth daily.     escitalopram 10 MG tablet  Commonly known as:  LEXAPRO  Take 5 mg by mouth daily.     ibandronate 150 MG tablet  Commonly known as:  BONIVA  Take 1 tablet (150 mg total) by mouth every 30 (thirty) days. Take in the morning with a full glass of water, on an empty stomach, and do not take anything else by mouth or lie down for the next 30 min.     mometasone-formoterol 100-5 MCG/ACT Aero  Commonly known as:  DULERA  INHALE 2 PUFFS INTO THE LUNGS BY MOUTH TWICE DAILY     simvastatin 40 MG tablet  Commonly known as:  ZOCOR  Take 1 tablet (40 mg total) by mouth at bedtime.     tiotropium 18 MCG inhalation capsule  Commonly known as:  SPIRIVA HANDIHALER  INHALE THE CONTENTS OF 1 CAPSULE VIA HANDIHALER ONCE DAILY     Vitamin D3 1000 UNITS Caps  Take 1 capsule by mouth daily.           Objective:   Physical Exam BP 122/74 mmHg  Pulse 116  Temp(Src) 97.5 F (36.4 C) (Oral)  Ht 5\' 3"  (1.6 m)  Wt 109 lb 8 oz (49.669 kg)  BMI 19.40 kg/m2  SpO2 94% General:   Well developed, well nourished . NAD.  HEENT:  Normocephalic . Face symmetric, atraumatic Neck: No thyromegaly Lungs:    Decreased breath sounds but clear Does have some difficulty breathing with minimal exertion like moving around the room Heart: RRR,  no murmur.  no pretibial edema bilaterally  Abdomen:  Not distended, soft, non-tender. No rebound or rigidity. No mass, no bruit  Skin: Not pale. Not jaundice Neurologic:  alert & oriented X3.  Speech normal, gait appropriate for age and unassisted Psych--  Cognition and judgment appear intact.  Cooperative with normal attention span and concentration.  Behavior appropriate. No anxious or depressed appearing.    Assessment & Plan:   Problem list> COPD, O2 24/7. +DOE w/ minimal exertion Hyperlipidemia Depression w/o anxiety (occ panick episode) - started Lexapro 7-16 PUD -- partial gastrectomy 1985 Osteopenia: t score -2.7 (2014), -2.6 (03-18-15), on boniva since~ 2011, will DC 04-2015 H/o CVA 2001  Plan: COPD, seems to be stable Hyperlipidemia: Continue simvastatin, check labs Depression: On Lexapro, feeling better. Osteopenia: Discussed last T score -2.6, will stop Boniva next month after 5 years. We discussed prolia , she will let me know if she likes to proceed Weight loss: No clear etiology but could be from COPD.will check TFTs, CBC. Also check a B12  . RTC 4 months

## 2015-04-17 NOTE — Assessment & Plan Note (Addendum)
Td 2008, pneumonia shot 01-2009, shingles shot 2011, prevnar 2015, had a  flu shot already  Female care per gyn , had a   Pap ~ 2014,   was abnormal, status post a biopsy (was told ok), saw gyn this year 2016  MMG 10- 2016  cscope   done @ [facility]  9- 2010, neg, next 10 years   slt less active, diet regular, loosing wt. See assessment and plan Labs: Hep C and HIV screening

## 2015-04-17 NOTE — Patient Instructions (Signed)
Get your blood work before you leave     Please consider visit these websites for more information:  [email or web address]  theconversationproject.org   Next visit 4 months, please make an appointment   Fall Prevention and Home Safety Falls cause injuries and can affect all age groups. It is possible to use preventive measures to significantly decrease the likelihood of falls. There are many simple measures which can make your home safer and prevent falls. OUTDOORS  Repair cracks and edges of walkways and driveways.  Remove high doorway thresholds.  Trim shrubbery on the main path into your home.  Have good outside lighting.  Clear walkways of tools, rocks, debris, and clutter.  Check that handrails are not broken and are securely fastened. Both sides of steps should have handrails.  Have leaves, snow, and ice cleared regularly.  Use sand or salt on walkways during winter months.  In the garage, clean up grease or oil spills. BATHROOM  Install night lights.  Install grab bars by the toilet and in the tub and shower.  Use non-skid mats or decals in the tub or shower.  Place a plastic non-slip stool in the shower to sit on, if needed.  Keep floors dry and clean up all water on the floor immediately.  Remove soap buildup in the tub or shower on a regular basis.  Secure bath mats with non-slip, double-sided rug tape.  Remove throw rugs and tripping hazards from the floors. BEDROOMS  Install night lights.  Make sure a bedside light is easy to reach.  Do not use oversized bedding.  Keep a telephone by your bedside.  Have a firm chair with side arms to use for getting dressed.  Remove throw rugs and tripping hazards from the floor. KITCHEN  Keep handles on pots and pans turned toward the center of the stove. Use back burners when possible.  Clean up spills quickly and allow time for drying.  Avoid walking on wet floors.  Avoid hot utensils and  knives.  Position shelves so they are not too high or low.  Place commonly used objects within easy reach.  If necessary, use a sturdy step stool with a grab bar when reaching.  Keep electrical cables out of the way.  Do not use floor polish or wax that makes floors slippery. If you must use wax, use non-skid floor wax.  Remove throw rugs and tripping hazards from the floor. STAIRWAYS  Never leave objects on stairs.  Place handrails on both sides of stairways and use them. Fix any loose handrails. Make sure handrails on both sides of the stairways are as long as the stairs.  Check carpeting to make sure it is firmly attached along stairs. Make repairs to worn or loose carpet promptly.  Avoid placing throw rugs at the top or bottom of stairways, or properly secure the rug with carpet tape to prevent slippage. Get rid of throw rugs, if possible.  Have an electrician put in a light switch at the top and bottom of the stairs. OTHER FALL PREVENTION TIPS  Wear low-heel or rubber-soled shoes that are supportive and fit well. Wear closed toe shoes.  When using a stepladder, make sure it is fully opened and both spreaders are firmly locked. Do not climb a closed stepladder.  Add color or contrast paint or tape to grab bars and handrails in your home. Place contrasting color strips on first and last steps.  Learn and use mobility aids as needed. Install an Dealer emergency  response system.  Turn on lights to avoid dark areas. Replace light bulbs that burn out immediately. Get light switches that glow.  Arrange furniture to create clear pathways. Keep furniture in the same place.  Firmly attach carpet with non-skid or double-sided tape.  Eliminate uneven floor surfaces.  Select a carpet pattern that does not visually hide the edge of steps.  Be aware of all pets. OTHER HOME SAFETY TIPS  Set the water temperature for 120 F (48.8 C).  Keep emergency numbers on or near the  telephone.  Keep smoke detectors on every level of the home and near sleeping areas. Document Released: 05/01/2002 Document Revised: 11/10/2011 Document Reviewed: 07/31/2011 [facility] Patient Information 2015 [place], [place]. This information is not intended to replace advice given to you by your health care provider. Make sure you discuss any questions you have with your health care provider.   Preventive Care for Adults Ages 34 and over  Blood pressure check.** / Every 1 to 2 years.  Lipid and cholesterol check.**/ Every 5 years beginning at age 66.  Lung cancer screening. / Every year if you are aged 33-80 years and have a 30-pack-year history of smoking and currently smoke or have quit within the past 15 years. Yearly screening is stopped once you have quit smoking for at least 15 years or develop a health problem that would prevent you from having lung cancer treatment.  Fecal occult blood test (FOBT) of stool. / Every year beginning at age 50 and continuing until age 50. You may not have to do this test if you get a colonoscopy every 10 years.  Flexible sigmoidoscopy** or colonoscopy.** / Every 5 years for a flexible sigmoidoscopy or every 10 years for a colonoscopy beginning at age 77 and continuing until age 69.  Hepatitis C blood test.** / For all people born from [birth year] through 1966 and any individual with known risks for hepatitis C.  Abdominal aortic aneurysm (AAA) screening.** / A one-time screening for ages 34 to 44 years who are current or former smokers.  Skin self-exam. / Monthly.  Influenza vaccine. / Every year.  Tetanus, diphtheria, and acellular pertussis (Tdap/Td) vaccine.** / 1 dose of Td every 10 years.  Varicella vaccine.** / Consult your health care provider.  Zoster vaccine.** / 1 dose for adults aged 61 years or older.  Pneumococcal 13-valent conjugate (PCV13) vaccine.** / Consult your health care provider.  Pneumococcal polysaccharide (PPSV23) vaccine.**  / 1 dose for all adults aged 23 years and older.  Meningococcal vaccine.** / Consult your health care provider.  Hepatitis A vaccine.** / Consult your health care provider.  Hepatitis B vaccine.** / Consult your health care provider.  Haemophilus influenzae type b (Hib) vaccine.** / Consult your health care provider. **Family history and personal history of risk and conditions may change your health care provider's recommendations. Document Released: 07/07/2001 Document Revised: 05/16/2013 Document Reviewed: 10/06/2010 [facility] Patient Information 2015 [place], [place]. This information is not intended to replace advice given to you by your health care provider. Make sure you discuss any questions you have with your health care provider.

## 2015-04-17 NOTE — Progress Notes (Signed)
Pre visit review using our clinic review tool, if applicable. No additional management support is needed unless otherwise documented below in the visit note. 

## 2015-04-18 LAB — HIV ANTIBODY (ROUTINE TESTING W REFLEX): HIV: NONREACTIVE

## 2015-04-18 LAB — HEPATITIS C ANTIBODY: HCV Ab: NEGATIVE

## 2015-04-20 NOTE — Assessment & Plan Note (Signed)
COPD, seems to be stable Hyperlipidemia: Continue simvastatin, check labs Depression: On Lexapro, feeling better. Osteopenia: Discussed last T score -2.6, will stop Boniva next month after 5 years. We discussed prolia , she will let me know if she likes to proceed Weight loss: No clear etiology but could be from COPD.will check TFTs, CBC. Also check a B12  . RTC 4 months

## 2015-04-23 ENCOUNTER — Other Ambulatory Visit (INDEPENDENT_AMBULATORY_CARE_PROVIDER_SITE_OTHER): Payer: Medicare Other

## 2015-04-23 ENCOUNTER — Ambulatory Visit (INDEPENDENT_AMBULATORY_CARE_PROVIDER_SITE_OTHER)
Admission: RE | Admit: 2015-04-23 | Discharge: 2015-04-23 | Disposition: A | Discharge status: Home / Self Care | Payer: Medicare Other | Source: Ambulatory Visit | Attending: Pulmonary Disease | Admitting: Pulmonary Disease

## 2015-04-23 ENCOUNTER — Encounter: Payer: Self-pay | Admitting: Pulmonary Disease

## 2015-04-23 ENCOUNTER — Ambulatory Visit (INDEPENDENT_AMBULATORY_CARE_PROVIDER_SITE_OTHER): Payer: Medicare Other | Admitting: Pulmonary Disease

## 2015-04-23 VITALS — BP 126/68 | HR 100 | Ht 63.0 in | Wt 108.0 lb

## 2015-04-23 DIAGNOSIS — J432 Centrilobular emphysema: Secondary | ICD-10-CM

## 2015-04-23 DIAGNOSIS — R634 Abnormal weight loss: Secondary | ICD-10-CM | POA: Diagnosis not present

## 2015-04-23 LAB — ALBUMIN: Albumin: 4.6 g/dL (ref 3.5–5.2)

## 2015-04-23 NOTE — Patient Instructions (Signed)
We will call you with the results of the chest x-ray in the bloodwork Please provide [place] with 3 samples of mucus Keep using your inhalers as you're doing We will see you back in 2-3 months or sooner if needed
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
## 2015-04-23 NOTE — Assessment & Plan Note (Addendum)
She has severe disease and had an exacerbation this year. I'm concerned about her protein calorie malnutrition contributing to the severity of her disease. We will place a [facility] for a [facility] referral.  Plan: Dietary referral as above Continue Dulera and Spiriva

## 2015-04-23 NOTE — Assessment & Plan Note (Signed)
This is worsening lately and is worrisome.  I explained the ddx includes pulmonary cachexia vs malnutrition (though albumin is normal), less likely malignancy or atypical mycobacterial disease.  She notes taking poor meals.  Plan CXR Sputum for AFB Pre-albumin Dietary referral GOLD COPD referall

## 2015-04-25 ENCOUNTER — Telehealth: Payer: Self-pay | Admitting: Pulmonary Disease

## 2015-04-25 NOTE — Telephone Encounter (Signed)
OK 

## 2015-04-25 NOTE — Telephone Encounter (Signed)
Called and spoke to pt. Informed her of the results and recs per BQ. Pt verbalized understanding. Pt is questioning if BQ would sign her handicap placard form. Pt stated she will drop off the form sometime next week.   Dr. Lake Bells please advise on handicap placard. Thanks.

## 2015-04-25 NOTE — Telephone Encounter (Signed)
Spoke with pt. She was given CXR results yesterday. She wants to know if the emphysema would be causing her to have loss of appetite? Please advise Dr. Lake Bells thanks

## 2015-04-25 NOTE — Telephone Encounter (Signed)
Called spoke with pt. She reports she will drop off this form sometime next week. FYI to [place] and will sign off for now.

## 2015-04-25 NOTE — Telephone Encounter (Signed)
It can The good news is that there was nothing else on the cxr like a tumor

## 2015-04-29 ENCOUNTER — Telehealth: Payer: Self-pay | Admitting: Internal Medicine

## 2015-04-29 NOTE — Telephone Encounter (Signed)
Caller name: Self   Can be reached: [phone]   Reason for call: Patient states Dr. Larose Kells wants her to have a Prolia injection and she wants to know when she needs to get it.

## 2015-04-30 NOTE — Telephone Encounter (Signed)
Prolia is being ordered. Once I have it on hand,will cal patent to set up appointment. Will notify patient.

## 2015-04-30 NOTE — Telephone Encounter (Signed)
Need to let Vernie Shanks, RN know when med comes in so she can schedule pt for nurse visit.

## 2015-04-30 NOTE — Telephone Encounter (Signed)
Please advise 

## 2015-04-30 NOTE — Telephone Encounter (Signed)
Start prolia, first injection @ the pt's convenience

## 2015-04-30 NOTE — Telephone Encounter (Signed)
Pt would like to begin Prolia, insurance verification completed, can I get 1 Prolia ordered please?

## 2015-04-30 NOTE — Telephone Encounter (Signed)
Called patient states she can't come until Jan/2017 . Scheduled appointment for patient.

## 2015-05-02 ENCOUNTER — Other Ambulatory Visit: Payer: Medicare Other

## 2015-05-02 DIAGNOSIS — J432 Centrilobular emphysema: Secondary | ICD-10-CM

## 2015-05-03 NOTE — Telephone Encounter (Signed)
Med received and placed in refrigerator.

## 2015-05-03 NOTE — Telephone Encounter (Signed)
Prolia scheduled 06/05/2015.

## 2015-05-06 LAB — LOWER RESPIRATORY CULTURE

## 2015-05-14 ENCOUNTER — Other Ambulatory Visit: Payer: Self-pay | Admitting: Internal Medicine

## 2015-05-23 LAB — FUNGUS CULTURE W SMEAR

## 2015-05-28 ENCOUNTER — Telehealth: Payer: Self-pay | Admitting: Pulmonary Disease

## 2015-05-28 NOTE — Telephone Encounter (Signed)
Pt is requesting results from 05/02/15.  BQ - please advise. Thanks.

## 2015-06-04 NOTE — Telephone Encounter (Signed)
BQ please advise.  

## 2015-06-05 ENCOUNTER — Ambulatory Visit (INDEPENDENT_AMBULATORY_CARE_PROVIDER_SITE_OTHER): Payer: Medicare Other | Admitting: Behavioral Health

## 2015-06-05 ENCOUNTER — Ambulatory Visit (INDEPENDENT_AMBULATORY_CARE_PROVIDER_SITE_OTHER): Payer: No Typology Code available for payment source | Admitting: Psychology

## 2015-06-05 DIAGNOSIS — F4323 Adjustment disorder with mixed anxiety and depressed mood: Secondary | ICD-10-CM | POA: Diagnosis not present

## 2015-06-05 DIAGNOSIS — M810 Age-related osteoporosis without current pathological fracture: Secondary | ICD-10-CM | POA: Diagnosis not present

## 2015-06-05 MED ORDER — DENOSUMAB 60 MG/ML ~~LOC~~ SOLN
60.0000 mg | Freq: Once | SUBCUTANEOUS | Status: AC
  Administered 2015-06-05: 60 mg via SUBCUTANEOUS

## 2015-06-05 NOTE — Telephone Encounter (Signed)
???   Sputum? This will take weeks to result

## 2015-06-05 NOTE — Telephone Encounter (Signed)
BQ please advise on results.

## 2015-06-05 NOTE — Progress Notes (Signed)
Pre visit review using our clinic review tool, if applicable. No additional management support is needed unless otherwise documented below in the visit note.  Patient tolerated injection well.  

## 2015-06-06 NOTE — Telephone Encounter (Signed)
Called spoke with pt. Made aware can take several weeks for final results. Nothing further needed

## 2015-06-26 LAB — AFB CULTURE WITH SMEAR (NOT AT ARMC)
Acid Fast Culture: NEGATIVE
Acid Fast Smear: NEGATIVE

## 2015-07-08 ENCOUNTER — Ambulatory Visit (INDEPENDENT_AMBULATORY_CARE_PROVIDER_SITE_OTHER): Payer: Medicare Other | Admitting: Pulmonary Disease

## 2015-07-08 ENCOUNTER — Encounter: Payer: Self-pay | Admitting: Pulmonary Disease

## 2015-07-08 VITALS — BP 124/66 | HR 112 | Ht 63.0 in | Wt 106.0 lb

## 2015-07-08 DIAGNOSIS — R634 Abnormal weight loss: Secondary | ICD-10-CM | POA: Diagnosis not present

## 2015-07-08 DIAGNOSIS — J432 Centrilobular emphysema: Secondary | ICD-10-CM | POA: Diagnosis not present

## 2015-07-08 DIAGNOSIS — E46 Unspecified protein-calorie malnutrition: Secondary | ICD-10-CM | POA: Diagnosis not present

## 2015-07-08 NOTE — Assessment & Plan Note (Signed)
She has significant protein calorie malnutrition which contributes to the severity of her COPD. I'm going to place a referral to a nutritionist.

## 2015-07-08 NOTE — Patient Instructions (Signed)
I am going to refer you to a nutritionist to help with weight gain Keep taking Spiriva and Dulera as you're doing Keep using her oxygen as you're doing We will see back in 3 months or sooner if needed

## 2015-07-08 NOTE — Assessment & Plan Note (Signed)
She has advanced COPD. Her symptoms are quite severe. She would be a reasonable candidate for opiate medication but at this time she's not interested in going on hospice. Apparently there is a transitional hospice program which has developed in her area recently. Once this comes online she would be an excellent candidate for it.  Plan: Continue current management F/u 3 months

## 2015-07-08 NOTE — Progress Notes (Signed)
Subjective:    Patient ID: Helen Scott, female    DOB: [birth year], 65 y.o.   MRN: [id number]  Synopsis: Former [facility] patient with GOLD D COPD pfts 2010:  FEV1 0.49 (ratio 27%). O2 dependent Transplant eval at [facility]--told too good, 2010 Hospitalized 2009 for COPD exacerbation and started on 2 L O2 As of 2016 has been using 3LPM O2 since 2015   HPI Chief Complaint  Patient presents with  . Follow-up    pt c/o worsening SOB, unexplained weight loss.     Helen Scott says that she is coughing up mucus more recently easily which is a good thing for her. Her breathing has been harder lately however.  She can only walk about 10 feet before stopping to breathe. She uses her concentrator at home and turns it to continuous when she is walking around. Her oxygen level is normally 92-95% but can run 97-98% when she is at rest.  She is taking Dulera and Spiriva regularly. She has not had more cough or cold symptoms lately.  She just feels like she "can't get enough air".    Past Medical History  Diagnosis Date  . PUD (peptic ulcer disease)      s/p partial gastrectomy 1985.    Marland Kitchen CVA (cerebral infarction) 06/1999  . Emphysema     FEV1 0.49, FEV1% 27 on 5/10  . Hyperlipidemia   . OSTEOPENIA 03/27/2009      Review of Systems  Constitutional: Positive for unexpected weight change. Negative for fever, chills and fatigue.  HENT: Negative for postnasal drip, rhinorrhea and sinus pressure.   Respiratory: Positive for cough and shortness of breath. Negative for wheezing.   Cardiovascular: Negative for chest pain, palpitations and leg swelling.       Objective:   Physical Exam  Filed Vitals:   07/08/15 1339  BP: 124/66  Pulse: 112  Height: 5\' 3"  (1.6 m)  Weight: 106 lb (48.081 kg)  SpO2: 97%   3L O2  Gen: well appearing HENT: OP clear, TM's clear, neck supple PULM: few crackles, limited air movement, no wheezing CV: RRR, no mgr, trace edema GI: BS+, soft, nontender Derm: no  cyanosis or rash Psyche: normal mood and affect  December 2016 sputum culture results reviewed, AFB negative  04/2015 Sputum culture> pan sensitive Pseudomonas  BMET    Component Value Date/Time   NA 143 04/17/2015 0957   K 4.0 04/17/2015 0957   CL 96 04/17/2015 0957   CO2 40* 04/17/2015 0957   GLUCOSE 99 04/17/2015 0957   BUN 13 04/17/2015 0957   CREATININE 0.56 04/17/2015 0957   CALCIUM 9.9 04/17/2015 0957   GFRNONAA 93.77 02/21/2010 1027   GFRAA 111 02/27/2008 0858        Assessment & Plan:  COPD (chronic obstructive pulmonary disease) ([place]) She has advanced COPD. Her symptoms are quite severe. She would be a reasonable candidate for opiate medication but at this time she's not interested in going on hospice. Apparently there is a transitional hospice program which has developed in her area recently. Once this comes online she would be an excellent candidate for it.  Plan: Continue current management F/u 3 months  Loss of weight She has significant protein calorie malnutrition which contributes to the severity of her COPD. I'm going to place a referral to a nutritionist.     Current outpatient prescriptions:  .  albuterol (PROVENTIL) (2.5 MG/3ML) 0.083% nebulizer solution, Take 3 mLs (2.5 mg total) by nebulization every 6 (six)  hours as needed for wheezing or shortness of breath., Disp: 150 mL, Rfl: 12 .  albuterol (VENTOLIN HFA) 108 (90 BASE) MCG/ACT inhaler, Inhale 1-2 puffs into the lungs every 4 (four) hours as needed for wheezing. Every 4-6 hours as needed., Disp: 1 Inhaler, Rfl: 6 .  Calcium Carbonate-Vitamin D (CALCIUM-CARB 600 + D) 600-125 MG-UNIT TABS, Take 1 tablet by mouth 2 (two) times daily.  , Disp: , Rfl:  .  Cholecalciferol (VITAMIN D3) 1000 UNITS CAPS, Take 1 capsule by mouth daily.  , Disp: , Rfl:  .  clopidogrel (PLAVIX) 75 MG tablet, Take 1 tablet (75 mg total) by mouth daily., Disp: 90 tablet, Rfl: 2 .  denosumab (PROLIA) 60 MG/ML SOLN injection,  Inject 60 mg into the skin every 6 (six) months. Administer in upper arm, thigh, or abdomen, Disp: , Rfl:  .  escitalopram (LEXAPRO) 10 MG tablet, Take 0.5 tablets (5 mg total) by mouth daily., Disp: 15 tablet, Rfl: 6 .  mometasone-formoterol (DULERA) 100-5 MCG/ACT AERO, INHALE 2 PUFFS INTO THE LUNGS BY MOUTH TWICE DAILY, Disp: 39 g, Rfl: 2 .  simvastatin (ZOCOR) 40 MG tablet, Take 1 tablet (40 mg total) by mouth at bedtime., Disp: 90 tablet, Rfl: 2 .  tiotropium (SPIRIVA HANDIHALER) 18 MCG inhalation capsule, INHALE THE CONTENTS OF 1 CAPSULE VIA HANDIHALER ONCE DAILY, Disp: 90 capsule, Rfl: 1

## 2015-07-23 DIAGNOSIS — H5213 Myopia, bilateral: Secondary | ICD-10-CM | POA: Diagnosis not present

## 2015-08-07 ENCOUNTER — Ambulatory Visit: Payer: No Typology Code available for payment source | Admitting: Psychology

## 2015-08-11 ENCOUNTER — Other Ambulatory Visit: Payer: Self-pay | Admitting: Pulmonary Disease

## 2015-08-12 ENCOUNTER — Other Ambulatory Visit: Payer: Self-pay

## 2015-08-12 MED ORDER — CLOPIDOGREL BISULFATE 75 MG PO TABS: 75.0000 mg | ORAL_TABLET | Freq: Every day | ORAL | Status: DC

## 2015-08-13 ENCOUNTER — Ambulatory Visit (INDEPENDENT_AMBULATORY_CARE_PROVIDER_SITE_OTHER): Payer: Medicare Other | Admitting: Internal Medicine

## 2015-08-13 ENCOUNTER — Encounter: Payer: Self-pay | Admitting: Internal Medicine

## 2015-08-13 VITALS — BP 110/66 | HR 112 | Temp 98.1°F | Ht 63.0 in | Wt 106.4 lb

## 2015-08-13 DIAGNOSIS — J9611 Chronic respiratory failure with hypoxia: Secondary | ICD-10-CM | POA: Diagnosis not present

## 2015-08-13 DIAGNOSIS — J432 Centrilobular emphysema: Secondary | ICD-10-CM

## 2015-08-13 DIAGNOSIS — Z09 Encounter for follow-up examination after completed treatment for conditions other than malignant neoplasm: Secondary | ICD-10-CM

## 2015-08-13 DIAGNOSIS — M810 Age-related osteoporosis without current pathological fracture: Secondary | ICD-10-CM | POA: Diagnosis not present

## 2015-08-13 DIAGNOSIS — R634 Abnormal weight loss: Secondary | ICD-10-CM | POA: Diagnosis not present

## 2015-08-13 NOTE — Assessment & Plan Note (Addendum)
Osteoporosis: Status post Prolia 05-2015. For the next injection, she prefers to get a printed prescription, get the drug from  the pharmacy and bring it here for injection. 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  COPD: Stable Diarrhea: See  ROS, loose watery stools for several weeks. Recommend stool collection for analysis, pt declined, she thinks is related to increase in coffee intake, plans to moderate caffeine intake. Encouraged to let me know if she is not better in few weeks for further eval. RTC 4-6 months

## 2015-08-13 NOTE — Progress Notes (Signed)
Pre visit review using our clinic review tool, if applicable. No additional management support is needed unless otherwise documented below in the visit note. 

## 2015-08-13 NOTE — Patient Instructions (Addendum)
  GO TO THE FRONT DESK Schedule your next appointment for a  Routine check  When?   4-6 months  Fasting?  No    If the diarrhea is not improving in the next 2 or 3 weeks, please call the office, will need to do some testing.

## 2015-08-13 NOTE — Progress Notes (Signed)
Subjective:    Patient ID: Helen Scott, female    DOB: 19-Apr-1951, 65 y.o.   MRN: [id number]  DOS:  08/13/2015 Type of visit - description :Routine follow-up  Interval history:  Weight loss: Since the last time she was here, wt has been stable. Was unable to see a nutritionist as recommended by pulmonary ($) Severe emphysema: Note from pulmonary reviewed, pt declined hospice, currently stable.  Wt Readings from Last 3 Encounters:  08/13/15 106 lb 6.4 oz (48.263 kg)  07/08/15 106 lb (48.081 kg)  04/23/15 108 lb (48.988 kg)     Review of Systems No fever chills No chest pain, lower extremity edema. Difficulty breathing at baseline. Reports diarrhea described as  loose or watery stools most days. No associated nausea, vomiting, constipation, abdominal pain;  no blood in the stools. Symptoms started shortly after her first prolia injection 05-2015.  Past Medical History  Diagnosis Date  . PUD (peptic ulcer disease)      s/p partial gastrectomy 1985.    Marland Kitchen CVA (cerebral infarction) 06/1999  . Emphysema     FEV1 0.49, FEV1% 27 on 5/10  . Hyperlipidemia   . OSTEOPENIA 03/27/2009    Past Surgical History  Procedure Laterality Date  . Partial gastrectomy       s/p partial gastrectomy 1985.    . Breast biopsy  80s    R breast   . Tubal ligation  1980s    Social History   Social History  . Marital Status: Single    Spouse Name: N/A  . Number of Children: 2  . Years of Education: N/A   Occupational History  . disability,previously worked in [employer], art.    Social History Main Topics  . Smoking status: Former Smoker -- 1.50 packs/day for 40 years    Types: Cigarettes    Quit date: 05/25/2005  . Smokeless tobacco: Never Used     Comment: 1 ppd started at age 66  . Alcohol Use: No     Comment: rarely  . Drug Use: No  . Sexual Activity: Not on file   Other Topics Concern  . Not on file   Social History Narrative   one living child, Blair Promise, lives in [place].    Single, lives by self        Medication List       This list is accurate as of: 08/13/15  8:41 PM.  Always use your most recent med list.               albuterol 108 (90 Base) MCG/ACT inhaler  Commonly known as:  VENTOLIN HFA  Inhale 1-2 puffs into the lungs every 4 (four) hours as needed for wheezing. Every 4-6 hours as needed.     albuterol (2.5 MG/3ML) 0.083% nebulizer solution  Commonly known as:  PROVENTIL  Take 3 mLs (2.5 mg total) by nebulization every 6 (six) hours as needed for wheezing or shortness of breath.     CALCIUM-CARB 600 + D 600-125 MG-UNIT Tabs  Generic drug:  Calcium Carbonate-Vitamin D  Take 1 tablet by mouth 2 (two) times daily.     clopidogrel 75 MG tablet  Commonly known as:  PLAVIX  Take 1 tablet (75 mg total) by mouth daily.     denosumab 60 MG/ML Soln injection  Commonly known as:  PROLIA  Inject 60 mg into the skin every 6 (six) months. Administer in upper arm, thigh, or abdomen  DULERA 100-5 MCG/ACT Aero  Generic drug:  mometasone-formoterol  INHALE 2 PUFFS BY MOUTH TWICE DAILY     escitalopram 10 MG tablet  Commonly known as:  LEXAPRO  Take 0.5 tablets (5 mg total) by mouth daily.     simvastatin 40 MG tablet  Commonly known as:  ZOCOR  Take 1 tablet (40 mg total) by mouth at bedtime.     SPIRIVA HANDIHALER 18 MCG inhalation capsule  Generic drug:  tiotropium  INHALE THE CONTENTS OF 1 CAPSULE VIA INHALATION DEVICE EVERY DAY     Vitamin D3 1000 units Caps  Take 1 capsule by mouth daily.           Objective:   Physical Exam BP 110/66 mmHg  Pulse 112  Temp(Src) 98.1 F (36.7 C) (Oral)  Ht 5\' 3"  (1.6 m)  Wt 106 lb 6.4 oz (48.263 kg)  BMI 18.85 kg/m2  SpO2 98% General:   Well developed, well nourished . NAD.  HEENT:  Normocephalic . Face symmetric, atraumatic Lungs:  Decreased breath sounds Normal respiratory effort, no intercostal retractions, no accessory muscle use. Heart: Tachycardic,  no  murmur.  No pretibial edema bilaterally  Skin: Not pale. Not jaundice Neurologic:  alert & oriented X3.  Speech normal, gait appropriate for age and unassisted Psych--  Cognition and judgment appear intact.  Cooperative with normal attention span and concentration.  Behavior appropriate. No anxious or depressed appearing.      Assessment & Plan:   Assessment COPD,Chronic respiratory failure ----->  O2 24/7. +DOE w/ minimal exertion Hyperlipidemia Depression w/o anxiety (occ panick episode) - started Lexapro 7-16 PUD -- partial gastrectomy 1985 Osteoporosis: t score -2.7 (2014), -2.6 (03-18-15), on boniva since~ 2011, will DC 04-2015 1st prolia 06-05-15 H/o CVA 2001  PLAN: Osteoporosis: Status post Prolia 05-2015. For the next injection, she prefers to get a printed prescription, get the drug from  the pharmacy and bring it here for injection. 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  COPD: Stable Diarrhea: See  ROS, loose watery stools for several weeks. Recommend stool collection for analysis, pt declined, she thinks is related to increase in coffee intake, plans to moderate caffeine intake. Encouraged to let me know if she is not better in few weeks for further eval. RTC 4-6 months

## 2015-08-16 ENCOUNTER — Ambulatory Visit: Payer: Self-pay | Admitting: Internal Medicine

## 2015-08-28 ENCOUNTER — Ambulatory Visit (INDEPENDENT_AMBULATORY_CARE_PROVIDER_SITE_OTHER): Payer: No Typology Code available for payment source | Admitting: Psychology

## 2015-08-28 DIAGNOSIS — F4323 Adjustment disorder with mixed anxiety and depressed mood: Secondary | ICD-10-CM

## 2015-09-03 DIAGNOSIS — H5213 Myopia, bilateral: Secondary | ICD-10-CM | POA: Diagnosis not present

## 2015-09-03 DIAGNOSIS — H25813 Combined forms of age-related cataract, bilateral: Secondary | ICD-10-CM | POA: Diagnosis not present

## 2015-09-03 DIAGNOSIS — H524 Presbyopia: Secondary | ICD-10-CM | POA: Diagnosis not present

## 2015-09-18 DIAGNOSIS — H2512 Age-related nuclear cataract, left eye: Secondary | ICD-10-CM | POA: Diagnosis not present

## 2015-09-24 ENCOUNTER — Other Ambulatory Visit: Payer: Self-pay | Admitting: Internal Medicine

## 2015-09-24 NOTE — Telephone Encounter (Signed)
Rx sent to the pharmacy by e-script.//AB/CMA 

## 2015-09-30 DIAGNOSIS — H2512 Age-related nuclear cataract, left eye: Secondary | ICD-10-CM | POA: Diagnosis not present

## 2015-09-30 DIAGNOSIS — H25812 Combined forms of age-related cataract, left eye: Secondary | ICD-10-CM | POA: Diagnosis not present

## 2015-10-08 DIAGNOSIS — H2511 Age-related nuclear cataract, right eye: Secondary | ICD-10-CM | POA: Diagnosis not present

## 2015-10-10 ENCOUNTER — Ambulatory Visit: Payer: Self-pay | Admitting: Pulmonary Disease

## 2015-10-14 DIAGNOSIS — H2511 Age-related nuclear cataract, right eye: Secondary | ICD-10-CM | POA: Diagnosis not present

## 2015-10-14 DIAGNOSIS — H25811 Combined forms of age-related cataract, right eye: Secondary | ICD-10-CM | POA: Diagnosis not present

## 2015-10-25 ENCOUNTER — Ambulatory Visit (INDEPENDENT_AMBULATORY_CARE_PROVIDER_SITE_OTHER): Payer: Medicare Other | Admitting: Pulmonary Disease

## 2015-10-25 ENCOUNTER — Encounter: Payer: Self-pay | Admitting: Pulmonary Disease

## 2015-10-25 VITALS — BP 140/62 | HR 128 | Ht 63.0 in | Wt 103.0 lb

## 2015-10-25 DIAGNOSIS — J439 Emphysema, unspecified: Secondary | ICD-10-CM

## 2015-10-25 DIAGNOSIS — J9611 Chronic respiratory failure with hypoxia: Secondary | ICD-10-CM

## 2015-10-25 DIAGNOSIS — J432 Centrilobular emphysema: Secondary | ICD-10-CM | POA: Diagnosis not present

## 2015-10-25 NOTE — Progress Notes (Signed)
History of Present Illness Helen Scott is a 65 y.o. female with Gold D COPD, oxygen dependent followed by Dr. Lake Bells.  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 O2 dependent Transplant eval at [facility]--told too good, 2010 Hospitalized 2009 for COPD exacerbation and started on 2 L O2 As of 2016 has been using 3LPM O2 since 2015  6/2/20173 Month Follow Up OV: Helen Scott presents to the office for 3 month follow up. She has been very out of breath with the change in the weather.She has also lost an additional 3 pounds.She did not go to the dietician because of the cost.She remains on 3L continuously.She is continuing the pulmonary rehab 3 times weekly.Secretions are yellowish. She denies fever, chest pain, She has a 2-3 pillow orthopnea,no hemoptysis. No leg or calf pain.We discussed the need to really work on weight gain.She is asking about starting on maintenance prednisone, a small dose because she is afraid she will swell..  Tests  Past medical hx Past Medical History  Diagnosis Date  . PUD (peptic ulcer disease)      s/p partial gastrectomy 1985.    Marland Kitchen CVA (cerebral infarction) 06/1999  . Emphysema     FEV1 0.49, FEV1% 27 on 5/10  . Hyperlipidemia   . OSTEOPENIA 03/27/2009     Past surgical hx, Family hx, Social hx all reviewed.  Current Outpatient Prescriptions on File Prior to Visit  Medication Sig  . albuterol (PROVENTIL) (2.5 MG/3ML) 0.083% nebulizer solution Take 3 mLs (2.5 mg total) by nebulization every 6 (six) hours as needed for wheezing or shortness of breath.  Marland Kitchen albuterol (VENTOLIN HFA) 108 (90 BASE) MCG/ACT inhaler Inhale 1-2 puffs into the lungs every 4 (four) hours as needed for wheezing. Every 4-6 hours as needed.  . Calcium Carbonate-Vitamin D (CALCIUM-CARB 600 + D) 600-125 MG-UNIT TABS Take 1 tablet by mouth 2 (two) times daily.    . Cholecalciferol (VITAMIN D3) 1000 UNITS CAPS Take 1 capsule by mouth daily.    .  clopidogrel (PLAVIX) 75 MG tablet Take 1 tablet (75 mg total) by mouth daily.  Marland Kitchen denosumab (PROLIA) 60 MG/ML SOLN injection Inject 60 mg into the skin every 6 (six) months. Administer in upper arm, thigh, or abdomen  . DULERA 100-5 MCG/ACT AERO INHALE 2 PUFFS BY MOUTH TWICE DAILY  . escitalopram (LEXAPRO) 10 MG tablet Take 0.5 tablets (5 mg total) by mouth daily.  . simvastatin (ZOCOR) 40 MG tablet TAKE 1 TABLET BY MOUTH AT BEDTIME  . SPIRIVA HANDIHALER 18 MCG inhalation capsule INHALE THE CONTENTS OF 1 CAPSULE VIA INHALATION DEVICE EVERY DAY   No current facility-administered medications on file prior to visit.     No Known Allergies  Review Of Systems:  Constitutional:   ++  weight loss, no night sweats,  Fevers, chills, fatigue, or  lassitude.  HEENT:   No headaches,  Difficulty swallowing,  Tooth/dental problems, or  Sore throat,                No sneezing, itching, ear ache, nasal congestion, post nasal drip,   CV:  No chest pain,  Orthopnea, PND, swelling in lower extremities, anasarca, dizziness, palpitations, syncope.   GI  No heartburn, indigestion, abdominal pain, nausea, vomiting, diarrhea, change in bowel habits, loss of appetite, bloody stools.   Resp: + shortness of breath with exertion and at rest.  + excess mucus, + productive cough,  No non-productive cough,  No coughing  up of blood.  No change in color of mucus.  No wheezing.  No chest wall deformity  Skin: no rash or lesions.  GU: no dysuria, change in color of urine, no urgency or frequency.  No flank pain, no hematuria   MS:  No joint pain or swelling.  No decreased range of motion.  No back pain.  Psych:  No change in mood or affect. No depression or anxiety.  No memory loss.   Vital Signs There were no vitals taken for this visit.   Physical Exam:  General- No distress,  A&Ox3, very thin and frail appearing ENT: No sinus tenderness, TM clear, pale nasal mucosa, no oral exudate,no post nasal drip, no  LAN Cardiac: S1, S2, regular rate and rhythm, no murmur, tachycardia Chest: No wheeze/ rales/ dullness; + accessory muscle use, no nasal flaring, no sternal retractions Abd.: Soft Non-tender Ext: No clubbing cyanosis, edema Neuro:  normal strength Skin: No rashes, warm and dry Psych: normal mood and behavior   Assessment/Plan  No problem-specific assessment & plan notes found for this encounter.    Magdalen Spatz, NP 10/25/2015  2:49 PM

## 2015-10-25 NOTE — Assessment & Plan Note (Signed)
Advanced COPD with severe symptoms Wears 3 L [place] continuously Plan: Consider referral to transitional Hospice Program Keep taking Spiriva and Dulera as you're doing Keep using your oxygen as you're doing Please see the diet suggestions for patient's with COPD. You need to work on weight gain, as you have lost ab additional 3 pounds over the last 3 months. We will see back in 3 months or sooner if needed.

## 2015-10-25 NOTE — Progress Notes (Signed)
Attending:  [name] was seen and examined today independently. I agree with the findings from the notes by Eric Form today.  Helen Scott continues to struggle with weight loss (3 more pounds) and she has not been able to afford nutritional support. She her sister asking about prednisone supplementation on a daily basis. She continues to use her oxygen and take her inhalers. She has not had a flareup since the last visit.  On exam: Chronically ill-appearing Poor air movement, no wheezing Wearing oxygen today  Impression/plan Very severe COPD, end-stage. Helen Scott continues to struggle with dyspnea and weight loss secondary to her very advanced COPD. She is still not quite interested in using narcotic medications to relieve her shortness of breath. However, I am reluctant to prescribe daily prednisone considering her known osteopenia as I feel that this would cause excessive risk of fracture.  We spent an extensive amount of time talking about various options today and I have decided to refer her to a "transitional hospice" program through [facility]. Specifically, we will use the [facility] pathway. I think this is be an ideal way to help manage her symptoms. If they feel that she would benefit from as needed morphine for relief of dyspnea that I would be happy to provide that.  > 50 minutes total spent on today's visit, > 50% face to face by me   Helen Awkward, MD [place] [facility] Pager: [phone] Cell: [phone] After 3pm or if no response, call [phone]

## 2015-10-25 NOTE — Patient Instructions (Addendum)
It is nice to meet you today. Keep taking Spiriva and Dulera as you're doing Keep using your oxygen as you're doing Please see the diet suggestions for patient's with COPD. You need to work on weight gain, as you have lost ab additional 3 pounds over the last 3 months. We will see back in 3 months or sooner if needed.

## 2015-10-25 NOTE — Assessment & Plan Note (Signed)
Continue wearing your oxygen at 3L continuously as you have been doing.

## 2015-10-28 ENCOUNTER — Telehealth: Payer: Self-pay | Admitting: Pulmonary Disease

## 2015-10-28 NOTE — Telephone Encounter (Signed)
Spoke with pt. States that she has been working around the house today and has become short of breath. While on the phone with her she was NOT IN DISTRESS. Her oxygen concentrator is currently set on 3L/min. I had her check her oxygen level while I was on the phone with her. Oxygen sat was 90% on 3L/min. Advised pt that it was fine to adjust her concentrator as needed but this should not be come a normal routine. Before we hung up her oxygen level has increased a little bit while still on 3L/min. States, "I just got myself worked up, rushing around the house to get things done." Advised pt that if she continues to have DOE to let us know. She verbalized understanding. Nothing further was needed.

## 2015-10-29 ENCOUNTER — Telehealth: Payer: Self-pay | Admitting: Pulmonary Disease

## 2015-10-29 NOTE — Telephone Encounter (Signed)
Noted and will forward to BQ as FYI

## 2015-10-29 NOTE — Telephone Encounter (Signed)
Wow, this is really, really surprising.  Can someone from [facility] help explain why she didn't meet criteria?   From my perspective she is a perfect patient for this.

## 2015-10-30 NOTE — Telephone Encounter (Signed)
Can we communicate this with the patient? It has to do with her PCP.  She may want to switch to a [facility] provider.

## 2015-10-30 NOTE — Telephone Encounter (Signed)
Spoke with Manus Gunning at [place]. She states that the pt did not meet criteria because she is not enrolled in [facility]. They can do a palliative care consult if BQ sees fit.  BQ - please advise. Thanks.

## 2015-10-31 NOTE — Telephone Encounter (Signed)
Spoke with pt to explain below. Pt's PCP is Dr. Larose Kells and he is in [facility] is he not in [facility]?   BQ do you know what PCP she would need to establish with to qualify for program?  Pt is willing to switch if needed.

## 2015-10-31 NOTE — Telephone Encounter (Signed)
Let me ask Dr. Joya Gaskins about this

## 2015-11-06 ENCOUNTER — Ambulatory Visit: Payer: No Typology Code available for payment source | Admitting: Psychology

## 2015-11-11 NOTE — Telephone Encounter (Signed)
Yes, apparently there are some patients who choose not to participate in [facility]?  At least that is how I understood it from the information I heard from [facility].  I would ask the patient if she was aware of this and if she is interested in participating in [facility].

## 2015-11-11 NOTE — Telephone Encounter (Signed)
Attempted to contact pt. No answer, no option to leave a message. Will try back.  

## 2015-11-11 NOTE — Telephone Encounter (Signed)
BQ have you spoken to PW about this?  Thanks!

## 2015-11-12 NOTE — Telephone Encounter (Signed)
I have spoke with [name] and we have discussed this patient and her needs. Helen Scott is in the process of e-mailing [name] with [facility] to get more information so that we can better explain [facility] and the benefits of participating with this [facility] program.  Will await information back from [place] before calling the patient so that we can be better prepared for any questions that may need to be answered.

## 2015-11-14 ENCOUNTER — Telehealth: Payer: Self-pay

## 2015-11-14 NOTE — Telephone Encounter (Signed)
Prolia re-verification started via online portal. Awaiting determination. Re-verification form sent for scanning.

## 2015-11-15 ENCOUNTER — Telehealth: Payer: Self-pay | Admitting: Internal Medicine

## 2015-11-15 MED ORDER — DENOSUMAB 60 MG/ML ~~LOC~~ SOLN: 60.0000 mg | SUBCUTANEOUS | Status: DC

## 2015-11-15 NOTE — Telephone Encounter (Signed)
Please inform Pt that insurance re-verification was being complete for her to receive Prolia on December 03, 2015 at [place]. I am sending Prolia to pharmacy, for her to pick up to bring her to appt. Thank you.

## 2015-11-15 NOTE — Telephone Encounter (Signed)
Caller name: Relation to [id number] Call back [phone] Pharmacy: [id number] - [place], [address] [place] [phone] (Phone) [phone] (Fax)         Reason for call:  Patient would like prescription for prolia to go to pharamcy due to the expense being to much when picking up from the office. Patient also would like to speak with the nurse directly regarding why she is due now rather in July. please advise.

## 2015-11-15 NOTE — Telephone Encounter (Signed)
Still working w/ [employer]

## 2015-11-15 NOTE — Telephone Encounter (Signed)
Duplicate telephone notes, see note from 11/14/2015.

## 2015-11-15 NOTE — Telephone Encounter (Signed)
Jess, any updates on this? Please advise

## 2015-11-15 NOTE — Telephone Encounter (Signed)
Prolia PA form faxed to [facility] at [phone]. Requesting expedited review. PA form sent for scanning. Awaiting determination.

## 2015-11-15 NOTE — Telephone Encounter (Signed)
Received Summary of benefits for Pt. PA is required. If approved Pt would be responsible for an estimated $0 out of pocket cost for Prolia and administration. Summary of benefits sent for scanning.

## 2015-11-15 NOTE — Telephone Encounter (Signed)
[employer] called to inform of the PA Approval of Prolia 60 mgs for this patient. Approval dates: 11/15/2015 - 11/14/2016. Any question please call [phone]. Approval letter to follow to provider and patient

## 2015-11-15 NOTE — Telephone Encounter (Signed)
Received fax confirmation on 11/15/2015 1006.

## 2015-11-15 NOTE — Telephone Encounter (Signed)
Spoke w/ Rica Mote from [facility], further questions answered regarding Prolia. Rica Mote informed he would forward to PA dept for review.

## 2015-11-15 NOTE — Telephone Encounter (Signed)
Helen Scott at 11/15/2015 2:15 PM     Status: Signed       Expand All Collapse All   Caller name: Relation to [id number] Call back [phone] Pharmacy: [id number] - [place], [place] [phone] (Phone) [phone] (Fax)         Reason for call:  Patient would like prescription for prolia to go to pharamcy due to the expense being to much when picking up from the office. Patient also would like to speak with the nurse directly regarding why she is due now rather in July. please advise.             Helen Scott at 11/15/2015 2:12 PM     Status: Signed       Expand All Collapse All   [employer] called to inform of the PA Approval of Prolia 60 mgs for this patient. Approval dates: 11/15/2015 - 11/14/2016. Any question please call [phone]. Approval letter to follow to provider and patient

## 2015-11-18 NOTE — Telephone Encounter (Signed)
Pt called in, informed her of below. She wanted to know if her insurance would pay for her to pick up medication early since she is not due until 7/11. Informed her I wasn't sure, recommend to call [facility] and they should be able to tell her when she can pick up Rx. Pt verbalized understanding.

## 2015-11-18 NOTE — Telephone Encounter (Signed)
Prolia Rx faxed to [facility] as requested. Still awaiting PA approval letter from [facility].

## 2015-11-22 NOTE — Telephone Encounter (Signed)
Received PA approval letter for Prolia 60mg /mL [place] SOLN. Authorization will end November 14, 2016. Approval letter faxed to Prolia at [phone]. PA approval letter sent for scanning.

## 2015-11-25 ENCOUNTER — Ambulatory Visit (INDEPENDENT_AMBULATORY_CARE_PROVIDER_SITE_OTHER): Payer: No Typology Code available for payment source | Admitting: Psychology

## 2015-11-25 DIAGNOSIS — F4323 Adjustment disorder with mixed anxiety and depressed mood: Secondary | ICD-10-CM

## 2015-11-25 NOTE — Telephone Encounter (Signed)
Jess, is there anything I can do on this? Not sure what is needed? Please advise.

## 2015-11-29 NOTE — Telephone Encounter (Signed)
Helen Scott please advise if this message can be closed. Thanks.

## 2015-12-02 NOTE — Telephone Encounter (Signed)
Holding for [employer].

## 2015-12-03 ENCOUNTER — Ambulatory Visit (INDEPENDENT_AMBULATORY_CARE_PROVIDER_SITE_OTHER): Payer: Medicare Other | Admitting: Internal Medicine

## 2015-12-03 ENCOUNTER — Encounter: Payer: Self-pay | Admitting: Internal Medicine

## 2015-12-03 VITALS — BP 122/60 | HR 97 | Temp 98.5°F | Ht 63.0 in | Wt 104.1 lb

## 2015-12-03 DIAGNOSIS — Z23 Encounter for immunization: Secondary | ICD-10-CM | POA: Diagnosis not present

## 2015-12-03 DIAGNOSIS — F32A Depression, unspecified: Secondary | ICD-10-CM

## 2015-12-03 DIAGNOSIS — F329 Major depressive disorder, single episode, unspecified: Secondary | ICD-10-CM | POA: Diagnosis not present

## 2015-12-03 DIAGNOSIS — M810 Age-related osteoporosis without current pathological fracture: Secondary | ICD-10-CM

## 2015-12-03 DIAGNOSIS — J439 Emphysema, unspecified: Secondary | ICD-10-CM | POA: Diagnosis not present

## 2015-12-03 DIAGNOSIS — R634 Abnormal weight loss: Secondary | ICD-10-CM | POA: Diagnosis not present

## 2015-12-03 MED ORDER — ESCITALOPRAM OXALATE 10 MG PO TABS: 5.0000 mg | ORAL_TABLET | Freq: Every day | ORAL | Status: DC

## 2015-12-03 MED ORDER — DENOSUMAB 60 MG/ML ~~LOC~~ SOLN
60.0000 mg | Freq: Once | SUBCUTANEOUS | Status: AC
  Administered 2015-12-03: 60 mg via SUBCUTANEOUS

## 2015-12-03 NOTE — Telephone Encounter (Signed)
Sorry for the delay - heard back from [facility] last week while I was on vacation Per the e-mail I received the only hard requirement is for the patient to be enrolled in [facility], which she is not.  Called spoke with Manus Gunning w/ [place] to inquire about how pt enrolls in [facility] but she was unable to provide clarification on this.  She will call back.  Called spoke with patient to discuss.  Pt voiced her understanding and is aware that we will continue to work on this for her.   Will go ahead and close message but keep in my box for follow up

## 2015-12-03 NOTE — Progress Notes (Signed)
Pre visit review using our clinic review tool, if applicable. No additional management support is needed unless otherwise documented below in the visit note. 

## 2015-12-03 NOTE — Assessment & Plan Note (Signed)
Osteoporosis: Prolia today. Continue calcium and vitamin D Depression: Well-controlled, refill Lexapro Weight loss: Weight has plateau. COPD: Advanced COPD, seems stable today, chart reviewed due for immunizations -->  pneumonia shot today Diarrhea: see last OV. Resolved RTC 03-2016 CPX

## 2015-12-03 NOTE — Progress Notes (Signed)
Subjective:    Patient ID: Helen Scott, female    DOB: 1950/09/29, 65 y.o.   MRN: [id number]  DOS:  12/03/2015 Type of visit - description :  Interval history: COPD: Pulmonary note from  10/25/2015 reviewed-- severe symptoms, on oxygen, considering hospice Osteoporosis: Due for priolia Complained of diarrhea the last time she was here, improved depression: Well-controlled, needs a refill  Wt Readings from Last 3 Encounters:  12/03/15 104 lb 2 oz (47.231 kg)  10/25/15 103 lb (46.72 kg)  08/13/15 106 lb 6.4 oz (48.263 kg)    Review of Systems   Past Medical History  Diagnosis Date  . PUD (peptic ulcer disease)      s/p partial gastrectomy 1985.    Marland Kitchen CVA (cerebral infarction) 06/1999  . Emphysema     FEV1 0.49, FEV1% 27 on 5/10  . Hyperlipidemia   . OSTEOPENIA 03/27/2009    Past Surgical History  Procedure Laterality Date  . Partial gastrectomy       s/p partial gastrectomy 1985.    . Breast biopsy  80s    R breast   . Tubal ligation  1980s    Social History   Social History  . Marital Status: Single    Spouse Name: N/A  . Number of Children: 2  . Years of Education: N/A   Occupational History  . disability,previously worked in Therapist, art.    Social History Main Topics  . Smoking status: Former Smoker -- 1.50 packs/day for 40 years    Types: Cigarettes    Quit date: 05/25/2005  . Smokeless tobacco: Never Used     Comment: 1 ppd started at age 18  . Alcohol Use: No     Comment: rarely  . Drug Use: No  . Sexual Activity: Not on file   Other Topics Concern  . Not on file   Social History Narrative   one living child, Blair Promise, lives in [place].    Single, lives by self        Medication List       This list is accurate as of: 12/03/15  1:06 PM.  Always use your most recent med list.               albuterol 108 (90 Base) MCG/ACT inhaler  Commonly known as:  VENTOLIN HFA  Inhale 1-2 puffs into the lungs every 4 (four)  hours as needed for wheezing. Every 4-6 hours as needed.     albuterol (2.5 MG/3ML) 0.083% nebulizer solution  Commonly known as:  PROVENTIL  Take 3 mLs (2.5 mg total) by nebulization every 6 (six) hours as needed for wheezing or shortness of breath.     CALCIUM-CARB 600 + D 600-125 MG-UNIT Tabs  Generic drug:  Calcium Carbonate-Vitamin D  Take 1 tablet by mouth 2 (two) times daily.     clopidogrel 75 MG tablet  Commonly known as:  PLAVIX  Take 1 tablet (75 mg total) by mouth daily.     denosumab 60 MG/ML Soln injection  Commonly known as:  PROLIA  Inject 60 mg into the skin every 6 (six) months. Administer in upper arm, thigh, or abdomen     DULERA 100-5 MCG/ACT Aero  Generic drug:  mometasone-formoterol  INHALE 2 PUFFS BY MOUTH TWICE DAILY     escitalopram 10 MG tablet  Commonly known as:  LEXAPRO  Take 0.5 tablets (5 mg total) by mouth daily.     simvastatin  40 MG tablet  Commonly known as:  ZOCOR  TAKE 1 TABLET BY MOUTH AT BEDTIME     SPIRIVA HANDIHALER 18 MCG inhalation capsule  Generic drug:  tiotropium  INHALE THE CONTENTS OF 1 CAPSULE VIA INHALATION DEVICE EVERY DAY     Vitamin D3 1000 units Caps  Take 1 capsule by mouth daily.           Objective:   Physical Exam BP 122/60 mmHg  Pulse 97  Temp(Src) 98.5 F (36.9 C) (Oral)  Ht 5\' 3"  (1.6 m)  Wt 104 lb 2 oz (47.231 kg)  BMI 18.45 kg/m2  SpO2 98% General:   Well developed, well nourished . NAD.  HEENT:  Normocephalic . Face symmetric, atraumatic Lungs:  Decreased breath sounds Normal respiratory effort, no intercostal retractions, no accessory muscle use. Heart: RRR,  no murmur.  No pretibial edema bilaterally  Skin: Not pale. Not jaundice Neurologic:  alert & oriented X3.  Speech normal, gait appropriate for age and unassisted Psych--  Cognition and judgment appear intact.  Cooperative with normal attention span and concentration.  Behavior appropriate. No anxious or depressed appearing.       Assessment & Plan:   Assessment COPD,Chronic respiratory failure ----->  O2 24/7. +DOE w/ minimal exertion Hyperlipidemia Depression w/o anxiety (occ panick episode) - started Lexapro 7-16 PUD -- partial gastrectomy 1985 Osteoporosis: t score -2.7 (2014), -2.6 (03-18-15), on boniva since~ 2011, will DC 04-2015 1st prolia 06-05-15 H/o CVA 2001  PLAN: Osteoporosis: Prolia today. Continue calcium and vitamin D Depression: Well-controlled, refill Lexapro Weight loss: Weight has plateau. COPD: Advanced COPD, seems stable today, chart reviewed due for immunizations -->  pneumonia shot today Diarrhea: see last OV. Resolved RTC 03-2016 CPX

## 2015-12-03 NOTE — Patient Instructions (Signed)
  GO TO THE FRONT DESK Schedule your next appointment for a  Physical by 03-2016

## 2015-12-04 ENCOUNTER — Telehealth: Payer: Self-pay

## 2015-12-04 ENCOUNTER — Telehealth: Payer: Self-pay | Admitting: Internal Medicine

## 2015-12-04 NOTE — Telephone Encounter (Signed)
Please call the patient: If she has generalized itching or a rash (not only at the site of injection) or if she has tongue, lip swelling, fever or chills: Please go to the ER . Okay to put ICE  at the site of injection along with OTC hydrocortisone

## 2015-12-04 NOTE — Telephone Encounter (Signed)
Pt received Pneumovax 23 IM and Prolia subq in L arm yesterday. Will you call to triage? Thank you.

## 2015-12-04 NOTE — Telephone Encounter (Signed)
Spoke w/ Pt, informed her of recommendations. Pt verbalized understanding.  

## 2015-12-04 NOTE — Telephone Encounter (Signed)
Relation to [id number] Call back number:[phone] Pharmacy:  Reason for call:  Patient states she received a phenomenon injection 12/03/15 and injection site is red and swollen. Please advise

## 2015-12-04 NOTE — Telephone Encounter (Signed)
Patient placed on Nurse schedule for tomorrow to evaluate  L arm.

## 2015-12-04 NOTE — Telephone Encounter (Signed)
Appointment scheduled for patient to be seen tomorrow for Nurse Visit to access Left arm. Patient states she cannot come today because it is too hot and she uses O2. States she has not taken Benadryl as of yet nor applied ice pack to the area.

## 2015-12-04 NOTE — Telephone Encounter (Signed)
Called patient,states she received her Prolia and Pneumonia injections yesterday. States the are that she received the pneumonia injection in (L) is currently red and swollenStates she feels like she can see fluid on the inside. States it is itching today and was itching last night very bad. Patient states arm is sore to the touch and when moving it.  States she has not applied ice to the area today nor yesterday. No drainage from area noted today. Forwarded information to Team Lead for review.

## 2015-12-05 ENCOUNTER — Ambulatory Visit (INDEPENDENT_AMBULATORY_CARE_PROVIDER_SITE_OTHER): Payer: Medicare Other | Admitting: Internal Medicine

## 2015-12-05 DIAGNOSIS — M7989 Other specified soft tissue disorders: Secondary | ICD-10-CM | POA: Diagnosis not present

## 2015-12-05 MED ORDER — PREDNISONE 20 MG PO TABS: 20.0000 mg | ORAL_TABLET | Freq: Every day | ORAL | Status: DC

## 2015-12-05 NOTE — Progress Notes (Signed)
Pre visit review using our clinic review tool, if applicable. No additional management support is needed unless otherwise documented below in the visit note.  Patient presents in office today for evaluation of the left arm per telephone note 12/04/15. Assessed the injection site and left arm, which was red, swollen and warm to touch. Patient rated her pain 3/10 on a 0-10 pain scale, but only with movement. Also, she addressed that she's been experiencing some itching at the site. No fever or chills at this time. Patient voiced that she last took Tylenol on yesterday afternoon and applied ice for 15 minutes later that night. PCP was made aware and completed an assessment of the patient's arm.    Per Dr. Larose Kells: Apply ice and OTC Hydrocortisone cream to the injection site. Take Prednisone 20 mg once daily for 5 days and Tylenol as needed for pain. Call the office, if you notice that in the next few days symptoms have not subsided or worsened.  Informed patient of the provider's recommendations. She verbalized understanding and did not have any further concerns prior to leaving the nurse visit.  Rx sent to the patient's preferred pharmacy.   Addendum  Patient had a pneumonia shot and prolia injection at the left shoulder, presents today with redness and itching at the inner aspect of the L  arm, NO systemic symptoms, doubt infection. Will prescribe prednisone for a few days,ice, topical hydrocortisone. If not better in few days let me know. For now, I think is safe to proceed with next  prolia in few months with close observation b/c is unclear if this truly a reaction to it and she had no systemic sx  JP.

## 2015-12-05 NOTE — Patient Instructions (Addendum)
Per Dr. Larose Kells: Apply ice and OTC Hydrocortisone cream to the injection site. Take Prednisone 20 mg once daily for 5 days and Tylenol as needed for pain. Call the office, if you notice that in the next few days symptoms have not subsided or worsened.

## 2015-12-13 ENCOUNTER — Ambulatory Visit: Payer: Self-pay | Admitting: Internal Medicine

## 2015-12-17 ENCOUNTER — Ambulatory Visit (INDEPENDENT_AMBULATORY_CARE_PROVIDER_SITE_OTHER): Payer: Medicare Other | Admitting: Pulmonary Disease

## 2015-12-17 ENCOUNTER — Encounter: Payer: Self-pay | Admitting: Pulmonary Disease

## 2015-12-17 DIAGNOSIS — J439 Emphysema, unspecified: Secondary | ICD-10-CM | POA: Diagnosis not present

## 2015-12-17 DIAGNOSIS — R079 Chest pain, unspecified: Secondary | ICD-10-CM | POA: Insufficient documentation

## 2015-12-17 DIAGNOSIS — R1310 Dysphagia, unspecified: Secondary | ICD-10-CM | POA: Diagnosis not present

## 2015-12-17 DIAGNOSIS — R072 Precordial pain: Secondary | ICD-10-CM

## 2015-12-17 NOTE — Assessment & Plan Note (Signed)
She's been having more dysphagia recently feeling that food is getting stuck.  Plan: Barium swallow, esophagram

## 2015-12-17 NOTE — Progress Notes (Signed)
Subjective:    Patient ID: Helen Scott, female    DOB: 03-15-1951, 65 y.o.   MRN: [id number]  Synopsis: Former [facility] patient with GOLD D COPD pfts 2010:  FEV1 0.49 (ratio 27%). O2 dependent Transplant eval at [facility]--told too good, 2010 Hospitalized 2009 for COPD exacerbation and started on 2 L O2 As of 2016 has been using 3LPM O2 since 2015   HPI Chief Complaint  Patient presents with  . Follow-up    pt states she is doing well, notes she is at baseline.  pt states if she eats late at night she has difficulty swallowing and worsening SOB.     Mekaela has been struggling when she is in outside, but she does OK when she is inside. She says that she tries to minimize eating late.  She says that she gets pain and tightness in the middle of her chest when she swallows. She wonders if food is getting stuck when she swallows.  She has never had esophageal strictures.   She had a prednisone taper about a month ago from her PCP for shoulder pain but her daughter reports that her breathing has been a bit better since moving to her new condo.     Past Medical History:  Diagnosis Date  . CVA (cerebral infarction) 06/1999  . Emphysema    FEV1 0.49, FEV1% 27 on 5/10  . Hyperlipidemia   . OSTEOPENIA 03/27/2009  . PUD (peptic ulcer disease)     s/p partial gastrectomy 1985.        Review of Systems  Constitutional: Positive for unexpected weight change. Negative for chills, fatigue and fever.  HENT: Negative for postnasal drip, rhinorrhea and sinus pressure.   Respiratory: Positive for cough and shortness of breath. Negative for wheezing.   Cardiovascular: Negative for chest pain, palpitations and leg swelling.       Objective:   Physical Exam  Vitals:   12/17/15 1355  BP: 116/62  Pulse: (!) 110  SpO2: 97%  Weight: 105 lb (47.6 kg)  Height: 5\' 3"  (1.6 m)   3L O2  Gen: chronically ill appearing HENT: OP clear, TM's clear, neck supple PULM: limited air movement but no  wheezing today CV: RRR, no mgr, trace edema GI: BS+, soft, nontender Derm: no cyanosis or rash Psyche: normal mood and affect  December 2016 sputum culture results reviewed, AFB negative  04/2015 Sputum culture> pan sensitive Pseudomonas  BMET    Component Value Date/Time   NA 143 04/17/2015 0957   K 4.0 04/17/2015 0957   CL 96 04/17/2015 0957   CO2 40 (H) 04/17/2015 0957   GLUCOSE 99 04/17/2015 0957   BUN 13 04/17/2015 0957   CREATININE 0.56 04/17/2015 0957   CALCIUM 9.9 04/17/2015 0957   GFRNONAA 93.77 02/21/2010 1027   GFRAA 111 02/27/2008 0858        Assessment & Plan:  COPD (chronic obstructive pulmonary disease) ([place]) She has very severe disease and is quite symptomatic. As noted in multiple previous notes I think she would be a great candidate for [facility] transitional hospice program for patients with advanced COPD.  For Almyra Free, she is not rapidly declining. Because she has not had repeated exacerbations of COPD I see no indication for something like daily azithromycin at this point.  Plan: Continue Spiriva Continue Dulera Stay active  Dysphagia She's been having more dysphagia recently feeling that food is getting stuck.  Plan: Barium swallow, esophagram  Chest pain Given the chest  tightness she's been experiencing recently with eating, I would like to evaluate this for cardiac disease with an echocardiogram and a stress test. She is at advanced risk for having coronary artery disease given her COPD per  > 50% of today's 30 minute visit spent face to face   Current Outpatient Prescriptions:  .  albuterol (PROVENTIL) (2.5 MG/3ML) 0.083% nebulizer solution, Take 3 mLs (2.5 mg total) by nebulization every 6 (six) hours as needed for wheezing or shortness of breath., Disp: 150 mL, Rfl: 12 .  albuterol (VENTOLIN HFA) 108 (90 BASE) MCG/ACT inhaler, Inhale 1-2 puffs into the lungs every 4 (four) hours as needed for wheezing. Every 4-6 hours as  needed., Disp: 1 Inhaler, Rfl: 6 .  Calcium Carbonate-Vitamin D (CALCIUM-CARB 600 + D) 600-125 MG-UNIT TABS, Take 1 tablet by mouth 2 (two) times daily.  , Disp: , Rfl:  .  Cholecalciferol (VITAMIN D3) 1000 UNITS CAPS, Take 1 capsule by mouth daily.  , Disp: , Rfl:  .  clopidogrel (PLAVIX) 75 MG tablet, Take 1 tablet (75 mg total) by mouth daily., Disp: 90 tablet, Rfl: 2 .  denosumab (PROLIA) 60 MG/ML SOLN injection, Inject 60 mg into the skin every 6 (six) months. Administer in upper arm, thigh, or abdomen, Disp: 1.8 mL, Rfl: 0 .  DULERA 100-5 MCG/ACT AERO, INHALE 2 PUFFS BY MOUTH TWICE DAILY, Disp: 39 g, Rfl: 3 .  escitalopram (LEXAPRO) 10 MG tablet, Take 0.5 tablets (5 mg total) by mouth daily., Disp: 45 tablet, Rfl: 2 .  simvastatin (ZOCOR) 40 MG tablet, TAKE 1 TABLET BY MOUTH AT BEDTIME, Disp: 90 tablet, Rfl: 1 .  SPIRIVA HANDIHALER 18 MCG inhalation capsule, INHALE THE CONTENTS OF 1 CAPSULE VIA INHALATION DEVICE EVERY DAY, Disp: 90 capsule, Rfl: 3

## 2015-12-17 NOTE — Assessment & Plan Note (Signed)
Given the chest tightness she's been experiencing recently with eating, I would like to evaluate this for cardiac disease with an echocardiogram and a stress test. She is at advanced risk for having coronary artery disease given her COPD per

## 2015-12-17 NOTE — Patient Instructions (Signed)
We'll arrange for a barium swallow We will arrange for an echocardiogram and nuclear stress test I recommend that you go online and look up Helen Scott: [email or web address] We will see you back in 3 months or sooner if needed

## 2015-12-17 NOTE — Assessment & Plan Note (Signed)
She has very severe disease and is quite symptomatic. As noted in multiple previous notes I think she would be a great candidate for [facility] hospice program for patients with advanced COPD.  For Almyra Free, she is not rapidly declining. Because she has not had repeated exacerbations of COPD I see no indication for something like daily azithromycin at this point.  Plan: Continue Spiriva Continue Dulera Stay active

## 2015-12-17 NOTE — Addendum Note (Signed)
Addended by: Len Blalock on: 12/17/2015 02:36 PM   Modules accepted: Orders

## 2015-12-25 ENCOUNTER — Ambulatory Visit (HOSPITAL_COMMUNITY)
Admission: RE | Admit: 2015-12-25 | Discharge: 2015-12-25 | Disposition: A | Discharge status: Home / Self Care | Payer: Medicare Other | Source: Ambulatory Visit | Attending: Pulmonary Disease | Admitting: Pulmonary Disease

## 2015-12-25 DIAGNOSIS — K449 Diaphragmatic hernia without obstruction or gangrene: Secondary | ICD-10-CM | POA: Diagnosis not present

## 2015-12-25 DIAGNOSIS — R1310 Dysphagia, unspecified: Secondary | ICD-10-CM | POA: Diagnosis not present

## 2015-12-31 ENCOUNTER — Telehealth: Payer: Self-pay | Admitting: Pulmonary Disease

## 2015-12-31 NOTE — Telephone Encounter (Signed)
Pt has questions about ECHO and CPST this week.  Pt states that she lost her Father last night, pt would like to reschedule tests to another day later next week.  Call sent to [facility] to assist in rescheduling. Will send to [facility] to document.

## 2015-12-31 NOTE — Telephone Encounter (Signed)
Called [facility], spoke with Butch Penny.  Pt rescheduled to 01/14/16 arrival @8 :15 am for Echo and Myocardial Perfusion study and pt agreeable to this.  Nothing further needed.

## 2015-12-31 NOTE — Telephone Encounter (Signed)
ATC, line busy, wcb

## 2016-01-03 ENCOUNTER — Other Ambulatory Visit (HOSPITAL_COMMUNITY): Payer: Self-pay

## 2016-01-03 ENCOUNTER — Encounter (HOSPITAL_COMMUNITY): Payer: Self-pay

## 2016-01-06 ENCOUNTER — Other Ambulatory Visit: Payer: Self-pay | Admitting: Internal Medicine

## 2016-01-09 ENCOUNTER — Telehealth (HOSPITAL_COMMUNITY): Payer: Self-pay | Admitting: Radiology

## 2016-01-09 NOTE — Telephone Encounter (Signed)
Patient given detailed instructions per Myocardial Perfusion Study Information Sheet for the test on 01/14/16 at 945. Patient notified to arrive 15 minutes early and that it is imperative to arrive on time for appointment to keep from having the test rescheduled.  If you need to cancel or reschedule your appointment, please call the office within 24 hours of your appointment. Failure to do so may result in a cancellation of your appointment, and a $50 no show fee. Patient verbalized understanding.Helen Scott

## 2016-01-14 ENCOUNTER — Ambulatory Visit (HOSPITAL_BASED_OUTPATIENT_CLINIC_OR_DEPARTMENT_OTHER): Payer: Medicare Other

## 2016-01-14 ENCOUNTER — Other Ambulatory Visit: Payer: Self-pay

## 2016-01-14 ENCOUNTER — Encounter (INDEPENDENT_AMBULATORY_CARE_PROVIDER_SITE_OTHER): Payer: Self-pay

## 2016-01-14 ENCOUNTER — Ambulatory Visit (HOSPITAL_COMMUNITY): Payer: Medicare Other | Attending: Cardiology

## 2016-01-14 DIAGNOSIS — R0609 Other forms of dyspnea: Secondary | ICD-10-CM | POA: Diagnosis not present

## 2016-01-14 DIAGNOSIS — R002 Palpitations: Secondary | ICD-10-CM | POA: Insufficient documentation

## 2016-01-14 DIAGNOSIS — R0602 Shortness of breath: Secondary | ICD-10-CM | POA: Diagnosis not present

## 2016-01-14 DIAGNOSIS — R9439 Abnormal result of other cardiovascular function study: Secondary | ICD-10-CM | POA: Insufficient documentation

## 2016-01-14 DIAGNOSIS — I351 Nonrheumatic aortic (valve) insufficiency: Secondary | ICD-10-CM | POA: Insufficient documentation

## 2016-01-14 DIAGNOSIS — R072 Precordial pain: Secondary | ICD-10-CM | POA: Diagnosis not present

## 2016-01-14 DIAGNOSIS — R079 Chest pain, unspecified: Secondary | ICD-10-CM | POA: Diagnosis present

## 2016-01-14 LAB — MYOCARDIAL PERFUSION IMAGING
CHL CUP NUCLEAR SDS: 3
CHL CUP RESTING HR STRESS: 89 {beats}/min
CSEPPHR: 120 {beats}/min
LV dias vol: 67 mL (ref 46–106)
LVSYSVOL: 25 mL
RATE: 0.27
SRS: 10
SSS: 13
TID: 1.16

## 2016-01-14 MED ORDER — TECHNETIUM TC 99M TETROFOSMIN IV KIT
11.0000 | PACK | Freq: Once | INTRAVENOUS | Status: AC | PRN
  Administered 2016-01-14: 11 via INTRAVENOUS
  Filled 2016-01-14: qty 11

## 2016-01-14 MED ORDER — REGADENOSON 0.4 MG/5ML IV SOLN
0.4000 mg | Freq: Once | INTRAVENOUS | Status: AC
  Administered 2016-01-14: 0.4 mg via INTRAVENOUS

## 2016-01-14 MED ORDER — TECHNETIUM TC 99M TETROFOSMIN IV KIT
32.8000 | PACK | Freq: Once | INTRAVENOUS | Status: AC | PRN
  Administered 2016-01-14: 32.8 via INTRAVENOUS
  Filled 2016-01-14: qty 33

## 2016-01-16 ENCOUNTER — Ambulatory Visit: Payer: Self-pay | Admitting: Pulmonary Disease

## 2016-01-22 ENCOUNTER — Ambulatory Visit (INDEPENDENT_AMBULATORY_CARE_PROVIDER_SITE_OTHER): Payer: No Typology Code available for payment source | Admitting: Psychology

## 2016-01-22 DIAGNOSIS — F4323 Adjustment disorder with mixed anxiety and depressed mood: Secondary | ICD-10-CM

## 2016-02-10 ENCOUNTER — Ambulatory Visit (INDEPENDENT_AMBULATORY_CARE_PROVIDER_SITE_OTHER): Payer: Medicare Other | Admitting: Behavioral Health

## 2016-02-10 DIAGNOSIS — Z23 Encounter for immunization: Secondary | ICD-10-CM | POA: Diagnosis not present

## 2016-02-10 NOTE — Progress Notes (Addendum)
Pre visit review using our clinic review tool, if applicable. No additional management support is needed unless otherwise documented below in the visit note.  Patient in clinic today for Influenza vaccination. IM given in Left Deltoid. Patient tolerated injection well.

## 2016-02-13 ENCOUNTER — Telehealth: Payer: Self-pay

## 2016-02-13 NOTE — Telephone Encounter (Signed)
Called and spoke with pt and she stated that she got her flu shot on Monday and now she is starting with laryngitis and sore throat, sinus drainage.  She stated that she does not feel bad and her cough that she has is clear sputum.  Pt did not know if she needed abx or prednisone or just let it take its course.  BQ please advise. Thanks  Allergies  Allergen Reactions  . Latex Other (See Comments)    Only Latex Tape causes Rash and abrasive

## 2016-02-13 NOTE — Telephone Encounter (Signed)
Called spoke with pt. Aware of recs below. Nothing further needed 

## 2016-02-13 NOTE — Telephone Encounter (Signed)
If no dyspnea then just use mucinex prn and watch out for worsening symptoms. Drink plenty of fluids

## 2016-03-04 ENCOUNTER — Ambulatory Visit: Payer: No Typology Code available for payment source | Admitting: Psychology

## 2016-03-20 ENCOUNTER — Encounter: Payer: Self-pay | Admitting: Pulmonary Disease

## 2016-03-20 ENCOUNTER — Ambulatory Visit (INDEPENDENT_AMBULATORY_CARE_PROVIDER_SITE_OTHER): Payer: Medicare Other | Admitting: Pulmonary Disease

## 2016-03-20 ENCOUNTER — Encounter (INDEPENDENT_AMBULATORY_CARE_PROVIDER_SITE_OTHER): Payer: Self-pay

## 2016-03-20 ENCOUNTER — Ambulatory Visit (INDEPENDENT_AMBULATORY_CARE_PROVIDER_SITE_OTHER)
Admission: RE | Admit: 2016-03-20 | Discharge: 2016-03-20 | Disposition: A | Discharge status: Home / Self Care | Payer: Medicare Other | Source: Ambulatory Visit | Attending: Pulmonary Disease | Admitting: Pulmonary Disease

## 2016-03-20 VITALS — BP 128/66 | HR 90 | Ht 63.0 in | Wt 103.8 lb

## 2016-03-20 DIAGNOSIS — R0602 Shortness of breath: Secondary | ICD-10-CM

## 2016-03-20 DIAGNOSIS — R079 Chest pain, unspecified: Secondary | ICD-10-CM | POA: Diagnosis not present

## 2016-03-20 DIAGNOSIS — R071 Chest pain on breathing: Secondary | ICD-10-CM | POA: Diagnosis not present

## 2016-03-20 DIAGNOSIS — J411 Mucopurulent chronic bronchitis: Secondary | ICD-10-CM

## 2016-03-20 DIAGNOSIS — R05 Cough: Secondary | ICD-10-CM | POA: Diagnosis not present

## 2016-03-20 MED ORDER — PREDNISONE 10 MG PO TABS: ORAL_TABLET | ORAL | 0 refills | Status: DC

## 2016-03-20 MED ORDER — LEVOFLOXACIN 500 MG PO TABS
500.0000 mg | ORAL_TABLET | Freq: Every day | ORAL | 0 refills | Status: AC
Start: 2016-03-20 — End: 2016-03-30

## 2016-03-20 NOTE — Progress Notes (Signed)
Subjective:    Patient ID: Helen Scott, female    DOB: [birth year], 65 y.o.   MRN: [id number]  Synopsis: Former [facility] patient with GOLD D COPD pfts 2010:  FEV1 0.49 (ratio 27%). O2 dependent Transplant eval at [facility]--told too good, 2010 Hospitalized 2009 for COPD exacerbation and started on 2 L O2 As of 2016 has been using 3LPM O2 since 2015   HPI Chief Complaint  Patient presents with  . Follow-up    pt c/o worsening SOB, prod cough with white mucus, R sided back pain X1 week.      She has been more short of breath lately.  She has more chest congestion since last week. She traveled out of town last week.  She has been using albuterol twice a day for the last week which is more than normal for her. She continues to use O2, now using 3L continuously.  No fever or chills.  No sick contacts.  Still taking Spiriva and Dulera.  She has been taking mucinex, its not really helping much.    She has some mild chest pain in the right chest which started after she slept on her R arm abnormally a few days ago.   Past Medical History:  Diagnosis Date  . CVA (cerebral infarction) 06/1999  . Emphysema    FEV1 0.49, FEV1% 27 on 5/10  . Hyperlipidemia   . OSTEOPENIA 03/27/2009  . PUD (peptic ulcer disease)     s/p partial gastrectomy 1985.        Review of Systems  Constitutional: Positive for unexpected weight change. Negative for chills, fatigue and fever.  HENT: Negative for postnasal drip, rhinorrhea and sinus pressure.   Respiratory: Positive for cough and shortness of breath. Negative for wheezing.   Cardiovascular: Negative for chest pain, palpitations and leg swelling.       Objective:   Physical Exam  Vitals:   03/20/16 1418  BP: 128/66  Pulse: 90  SpO2: 95%  Weight: 103 lb 12.8 oz (47.1 kg)  Height: 5\' 3"  (1.6 m)   3L O2  Gen: chronically ill appearing HENT: OP clear, TM's clear, neck supple PULM: limited air movement some rhonchi bilaterally CV: RRR, no  mgr, trace edema GI: BS+, soft, nontender Derm: no cyanosis or rash Psyche: normal mood and affect  December 2016 sputum culture results reviewed, AFB negative  04/2015 Sputum culture> pan sensitive Pseudomonas  BMET    Component Value Date/Time   NA 143 04/17/2015 0957   K 4.0 04/17/2015 0957   CL 96 04/17/2015 0957   CO2 40 (H) 04/17/2015 0957   GLUCOSE 99 04/17/2015 0957   BUN 13 04/17/2015 0957   CREATININE 0.56 04/17/2015 0957   CALCIUM 9.9 04/17/2015 0957   GFRNONAA 93.77 02/21/2010 1027   GFRAA 111 02/27/2008 0858        Assessment & Plan:  COPD (chronic obstructive pulmonary disease) ([place]) Unfortunately Yu is having another exacerbation of COPD. Considering her very severe, near end-stage disease we need to treat this aggressively.  Plan: Levaquin 500 mg 5 days Prednisone taper Albuterol twice a day via nebulizer minimum, more if needed Continue Dulera and Spiriva If no improvement go to the ER Chest x-ray today  Chest pain New problem on the right, I think it's most likely musculoskeletal but we'll follow-up the chest x-ray    Current Outpatient Prescriptions:  .  albuterol (VENTOLIN HFA) 108 (90 Base) MCG/ACT inhaler, Inhale 1-2 puffs into the lungs every 4 (four)  hours as needed for wheezing or shortness of breath., Disp: 18 g, Rfl: 5 .  Calcium Carbonate-Vitamin D (CALCIUM-CARB 600 + D) 600-125 MG-UNIT TABS, Take 1 tablet by mouth 2 (two) times daily.  , Disp: , Rfl:  .  Cholecalciferol (VITAMIN D3) 1000 UNITS CAPS, Take 1 capsule by mouth daily.  , Disp: , Rfl:  .  clopidogrel (PLAVIX) 75 MG tablet, Take 1 tablet (75 mg total) by mouth daily., Disp: 90 tablet, Rfl: 2 .  denosumab (PROLIA) 60 MG/ML SOLN injection, Inject 60 mg into the skin every 6 (six) months. Administer in upper arm, thigh, or abdomen, Disp: 1.8 mL, Rfl: 0 .  DULERA 100-5 MCG/ACT AERO, INHALE 2 PUFFS BY MOUTH TWICE DAILY, Disp: 39 g, Rfl: 3 .  escitalopram (LEXAPRO) 10 MG tablet,  Take 0.5 tablets (5 mg total) by mouth daily., Disp: 45 tablet, Rfl: 2 .  simvastatin (ZOCOR) 40 MG tablet, TAKE 1 TABLET BY MOUTH AT BEDTIME, Disp: 90 tablet, Rfl: 1 .  SPIRIVA HANDIHALER 18 MCG inhalation capsule, INHALE THE CONTENTS OF 1 CAPSULE VIA INHALATION DEVICE EVERY DAY, Disp: 90 capsule, Rfl: 3 .  levofloxacin (LEVAQUIN) 500 MG tablet, Take 1 tablet (500 mg total) by mouth daily., Disp: 5 tablet, Rfl: 0 .  predniSONE (DELTASONE) 10 MG tablet, Take 40mg  po daily for 3 days, then take 30mg  po daily for 3 days, then take 20mg  po daily for two days, then take 10mg  po daily for 2 days, Disp: 27 tablet, Rfl: 0

## 2016-03-20 NOTE — Assessment & Plan Note (Signed)
Unfortunately Helen Scott is having another exacerbation of COPD. Considering her very severe, near end-stage disease we need to treat this aggressively.  Plan: Levaquin 500 mg 5 days Prednisone taper Albuterol twice a day via nebulizer minimum, more if needed Continue Dulera and Spiriva If no improvement go to the ER Chest x-ray today

## 2016-03-20 NOTE — Patient Instructions (Signed)
Take the prednisone and Levaquin as prescribed Use albuterol twice a day for the next 4-5 days, he can use more than this if needed Keep taking your other medicines as you are doing Use your oxygen as you're doing We will see you back in 6 weeks or sooner if needed If your condition worsens over the weekend the need to go to the emergency room

## 2016-03-20 NOTE — Assessment & Plan Note (Signed)
New problem on the right, I think it's most likely musculoskeletal but we'll follow-up the chest x-ray

## 2016-03-23 ENCOUNTER — Ambulatory Visit: Payer: No Typology Code available for payment source | Admitting: Psychology

## 2016-03-30 ENCOUNTER — Ambulatory Visit: Payer: No Typology Code available for payment source | Admitting: Psychology

## 2016-04-06 ENCOUNTER — Other Ambulatory Visit: Payer: Self-pay | Admitting: Internal Medicine

## 2016-04-22 DIAGNOSIS — Z1231 Encounter for screening mammogram for malignant neoplasm of breast: Secondary | ICD-10-CM | POA: Diagnosis not present

## 2016-04-22 LAB — HM MAMMOGRAPHY

## 2016-04-28 ENCOUNTER — Encounter: Payer: Self-pay | Admitting: Internal Medicine

## 2016-04-28 ENCOUNTER — Ambulatory Visit (INDEPENDENT_AMBULATORY_CARE_PROVIDER_SITE_OTHER): Payer: Medicare Other | Admitting: Internal Medicine

## 2016-04-28 VITALS — BP 118/78 | HR 103 | Temp 97.8°F | Resp 14 | Ht 63.0 in | Wt 104.4 lb

## 2016-04-28 DIAGNOSIS — Z0000 Encounter for general adult medical examination without abnormal findings: Secondary | ICD-10-CM

## 2016-04-28 LAB — COMPREHENSIVE METABOLIC PANEL
ALBUMIN: 4.1 g/dL (ref 3.5–5.2)
ALK PHOS: 44 U/L (ref 39–117)
ALT: 9 U/L (ref 0–35)
AST: 17 U/L (ref 0–37)
BUN: 11 mg/dL (ref 6–23)
CHLORIDE: 96 meq/L (ref 96–112)
CO2: 42 mEq/L — ABNORMAL HIGH (ref 19–32)
Calcium: 9.2 mg/dL (ref 8.4–10.5)
Creatinine, Ser: 0.55 mg/dL (ref 0.40–1.20)
GFR: 117.43 mL/min (ref >60.00)
Glucose, Bld: 111 mg/dL — ABNORMAL HIGH (ref 70–99)
POTASSIUM: 4 meq/L (ref 3.5–5.1)
SODIUM: 143 meq/L (ref 135–145)
TOTAL PROTEIN: 6.9 g/dL (ref 6.0–8.3)
Total Bilirubin: 0.3 mg/dL (ref 0.2–1.2)

## 2016-04-28 LAB — CBC WITH DIFFERENTIAL/PLATELET
BASOS PCT: 0.1 % (ref 0.0–3.0)
Basophils Absolute: 0 10*3/uL (ref 0.0–0.1)
EOS ABS: 0 10*3/uL (ref 0.0–0.7)
EOS PCT: 0.6 % (ref 0.0–5.0)
HCT: 37.5 % (ref 36.0–46.0)
Hemoglobin: 12.3 g/dL (ref 12.0–15.0)
LYMPHS ABS: 1 10*3/uL (ref 0.7–4.0)
Lymphocytes Relative: 14.5 % (ref 12.0–46.0)
MCHC: 32.7 g/dL (ref 30.0–36.0)
MCV: 90.1 fl (ref 78.0–100.0)
MONO ABS: 0.4 10*3/uL (ref 0.1–1.0)
Monocytes Relative: 6.1 % (ref 3.0–12.0)
NEUTROS ABS: 5.6 10*3/uL (ref 1.4–7.7)
NEUTROS PCT: 78.7 % — AB (ref 43.0–77.0)
PLATELETS: 159 10*3/uL (ref 150.0–400.0)
RBC: 4.17 Mil/uL (ref 3.87–5.11)
RDW: 13.8 % (ref 11.5–15.5)
WBC: 7.1 10*3/uL (ref 4.0–10.5)

## 2016-04-28 LAB — LIPID PANEL
CHOLESTEROL: 170 mg/dL (ref 0–200)
HDL: 67.5 mg/dL (ref >39.00)
LDL CALC: 90 mg/dL (ref 0–99)
NonHDL: 102.04
Total CHOL/HDL Ratio: 3
Triglycerides: 61 mg/dL (ref 0.0–149.0)
VLDL: 12.2 mg/dL (ref 0.0–40.0)

## 2016-04-28 NOTE — Progress Notes (Signed)
Pre visit review using our clinic review tool, if applicable. No additional management support is needed unless otherwise documented below in the visit note. 

## 2016-04-28 NOTE — Assessment & Plan Note (Addendum)
Td 2008, pneumonia shot 01-2009, shingles shot 2011, prevnar 2015, had a  flu shot already Had a shingles shot  Female care per gyn , had a   Pap ~ 2014,   was abnormal, status post a biopsy (was told ok), plans to see gyn 2018  MMG 10- 2016, another MMG was done 2 weeks ago  cscope   done @ [facility]  9- 2010, neg, next 10 years   Labs: CMP, FLP, CBC. Encourage to stay as active as possible, fall prevention discussed.

## 2016-04-28 NOTE — Assessment & Plan Note (Signed)
Osteoporosis: due for prolia  in few weeks. COPD, chronic respiratory failure: slightly worse, follow-up by pulmonology Depression: Would like to change the timing of Lexapro to AM b/c she gets panicky in the mornings sometimes, that is okay. D/w pt  increasing dose to 10 mg, she will think about it. Weight loss: Not an issue anymore RTC 6 months

## 2016-04-28 NOTE — Progress Notes (Signed)
Subjective:    Patient ID: Helen Scott, female    DOB: January 25, 1951, 65 y.o.   MRN: [id number]  DOS:  04/28/2016 Type of visit - description : Complete physical exam Interval history: Feels at baseline, respiratory symptoms are increasing, gradually. Closely follow-up by pulmonary. Lost her father recently, seems to be grieving okay.  Wt Readings from Last 3 Encounters:  04/28/16 104 lb 6 oz (47.3 kg)  03/20/16 103 lb 12.8 oz (47.1 kg)  01/14/16 105 lb (47.6 kg)     Review of Systems  Constitutional: No fever. No chills. No unexplained wt changes. No unusual sweats  HEENT: No dental problems, no ear discharge, no facial swelling, no voice changes. No eye discharge, no eye  redness , no  intolerance to light   Respiratory: Symptoms at baseline. + Mucus production, no hemoptysis. + DOE  Cardiovascular: No CP, no leg swelling , no  Palpitations  GI: no nausea, no vomiting, no diarrhea , no  abdominal pain.  No blood in the stools. No dysphagia, no odynophagia    Endocrine: No polyphagia, no polyuria , no polydipsia  GU: No dysuria, gross hematuria, difficulty urinating. No urinary urgency, no frequency.  Musculoskeletal: No joint swellings or unusual aches or pains  Skin: No change in the color of the skin, palor , no  Rash  Allergic, immunologic: No environmental allergies , no  food allergies  Neurological: No dizziness no  syncope. No headaches. No diplopia, no slurred, no slurred speech, no motor deficits, no facial  Numbness  Hematological: No enlarged lymph nodes, no easy bruising , no unusual bleedings  Psychiatry: No suicidal ideas, no hallucinations, no beavior problems, no confusion.    Past Medical History:  Diagnosis Date  . CVA (cerebral infarction) 06/1999  . Emphysema    FEV1 0.49, FEV1% 27 on 5/10  . Hyperlipidemia   . OSTEOPENIA 03/27/2009  . PUD (peptic ulcer disease)     s/p partial gastrectomy 1985.      Past Surgical History:  Procedure  Laterality Date  . BREAST BIOPSY  80s   R breast   . PARTIAL GASTRECTOMY      s/p partial gastrectomy 1985.    . TUBAL LIGATION  1980s    Social History   Social History  . Marital status: Single    Spouse name: N/A  . Number of children: 2  . Years of education: N/A   Occupational History  . disability,previously worked in Therapist, art. [employer]   Social History Main Topics  . Smoking status: Former Smoker    Packs/day: 1.50    Years: 40.00    Types: Cigarettes    Quit date: 05/25/2005  . Smokeless tobacco: Never Used     Comment: 1 ppd started at age 77  . Alcohol use No     Comment: rarely  . Drug use: No  . Sexual activity: Not on file   Other Topics Concern  . Not on file   Social History Narrative   one living child, Blair Promise, lives in [place].    Single, lives by self     Family History  Problem Relation Age of Onset  . Diabetes Father 38  . Heart disease Father     MI at age 92s  . Prostate cancer Father     dx in his 38s  . Breast cancer Maternal Aunt     aunt, great aunt  and 2 first cousins  .  Hypertension Neg Hx   . Stroke Neg Hx   . Colon cancer Neg Hx        Medication List       Accurate as of 04/28/16  9:07 PM. Always use your most recent med list.          albuterol 108 (90 Base) MCG/ACT inhaler Commonly known as:  VENTOLIN HFA Inhale 1-2 puffs into the lungs every 4 (four) hours as needed for wheezing or shortness of breath.   CALCIUM-CARB 600 + D 600-125 MG-UNIT Tabs Generic drug:  Calcium Carbonate-Vitamin D Take 1 tablet by mouth 2 (two) times daily.   clopidogrel 75 MG tablet Commonly known as:  PLAVIX Take 1 tablet (75 mg total) by mouth daily.   denosumab 60 MG/ML Soln injection Commonly known as:  PROLIA Inject 60 mg into the skin every 6 (six) months. Administer in upper arm, thigh, or abdomen   DULERA 100-5 MCG/ACT Aero Generic drug:  mometasone-formoterol INHALE 2 PUFFS BY MOUTH TWICE  DAILY   escitalopram 10 MG tablet Commonly known as:  LEXAPRO Take 0.5 tablets (5 mg total) by mouth daily.   simvastatin 40 MG tablet Commonly known as:  ZOCOR Take 1 tablet (40 mg total) by mouth at bedtime.   SPIRIVA HANDIHALER 18 MCG inhalation capsule Generic drug:  tiotropium INHALE THE CONTENTS OF 1 CAPSULE VIA INHALATION DEVICE EVERY DAY   Vitamin D3 1000 units Caps Take 1 capsule by mouth daily.          Objective:   Physical Exam BP 118/78 (BP Location: Left Arm, Patient Position: Sitting, Cuff Size: Small)   Pulse (!) 103   Temp 97.8 F (36.6 C) (Oral)   Resp 14   Ht 5\' 3"  (1.6 m)   Wt 104 lb 6 oz (47.3 kg)   SpO2 92%   BMI 18.49 kg/m   General:   Well developed, chronically ill-appearing lady, on oxygen, underweight. Neck: No  thyromegaly  HEENT:  Normocephalic . Face symmetric, atraumatic Lungs:  Decreased breath sounds otherwise clear Normal respiratory effort, no intercostal retractions, no accessory muscle use. Heart: RRR,  no murmur.  No pretibial edema bilaterally  Abdomen:  Not distended, soft, non-tender.  Skin: Exposed areas without rash.   Neurologic:  alert & oriented X3.  Speech normal, gait appropriate for age and unassisted Strength symmetric and appropriate for age.  Psych: Cognition and judgment appear intact.  Cooperative with normal attention span and concentration.  Behavior appropriate. No anxious or depressed appearing.    Assessment & Plan:   Assessment COPD,Chronic respiratory failure ----->  O2 24/7. 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 COPD, chronic respiratory failure: slightly worse, follow-up by pulmonology Depression: Would like to  change the timing of Lexapro to AM b/c she gets panicky in the mornings sometimes, that is okay. D/w pt  increasing dose to 10 mg, she will think about it. Weight loss: Not an issue anymore RTC 6 months

## 2016-04-28 NOTE — Patient Instructions (Signed)
GO TO THE LAB : Get the blood work     GO TO THE FRONT DESK Schedule your next appointment for a  routine checkup in 6 months   Fall Prevention in the Home Falls can cause injuries and can affect people from all age groups. There are many simple things that you can do to make your home safe and to help prevent falls. What can I do on the outside of my home?  Regularly repair the edges of walkways and driveways and fix any cracks.  Remove high doorway thresholds.  Trim any shrubbery on the main path into your home.  Use bright outdoor lighting.  Clear walkways of debris and clutter, including tools and rocks.  Regularly check that handrails are securely fastened and in good repair. Both sides of any steps should have handrails.  Install guardrails along the edges of any raised decks or porches.  Have leaves, snow, and ice cleared regularly.  Use sand or salt on walkways during winter months.  In the garage, clean up any spills right away, including grease or oil spills. What can I do in the bathroom?  Use night lights.  Install grab bars by the toilet and in the tub and shower. Do not use towel bars as grab bars.  Use non-skid mats or decals on the floor of the tub or shower.  If you need to sit down while you are in the shower, use a plastic, non-slip stool.  Keep the floor dry. Immediately clean up any water that spills on the floor.  Remove soap buildup in the tub or shower on a regular basis.  Attach bath mats securely with double-sided non-slip rug tape.  Remove throw rugs and other tripping hazards from the floor. What can I do in the bedroom?  Use night lights.  Make sure that a bedside light is easy to reach.  Do not use oversized bedding that drapes onto the floor.  Have a firm chair that has side arms to use for getting dressed.  Remove throw rugs and other tripping hazards from the floor. What can I do in the kitchen?  Clean up any spills right  away.  Avoid walking on wet floors.  Place frequently used items in easy-to-reach places.  If you need to reach for something above you, use a sturdy step stool that has a grab bar.  Keep electrical cables out of the way.  Do not use floor polish or wax that makes floors slippery. If you have to use wax, make sure that it is non-skid floor wax.  Remove throw rugs and other tripping hazards from the floor. What can I do in the stairways?  Do not leave any items on the stairs.  Make sure that there are handrails on both sides of the stairs. Fix handrails that are broken or loose. Make sure that handrails are as long as the stairways.  Check any carpeting to make sure that it is firmly attached to the stairs. Fix any carpet that is loose or worn.  Avoid having throw rugs at the top or bottom of stairways, or secure the rugs with carpet tape to prevent them from moving.  Make sure that you have a light switch at the top of the stairs and the bottom of the stairs. If you do not have them, have them installed. What are some other fall prevention tips?  Wear closed-toe shoes that fit well and support your feet. Wear shoes that have rubber soles  or low heels.  When you use a stepladder, make sure that it is completely opened and that the sides are firmly locked. Have someone hold the ladder while you are using it. Do not climb a closed stepladder.  Add color or contrast paint or tape to grab bars and handrails in your home. Place contrasting color strips on the first and last steps.  Use mobility aids as needed, such as canes, walkers, scooters, and crutches.  Turn on lights if it is dark. Replace any light bulbs that burn out.  Set up furniture so that there are clear paths. Keep the furniture in the same spot.  Fix any uneven floor surfaces.  Choose a carpet design that does not hide the edge of steps of a stairway.  Be aware of any and all pets.  Review your medicines with your  healthcare provider. Some medicines can cause dizziness or changes in blood pressure, which increase your risk of falling. Talk with your health care provider about other ways that you can decrease your risk of falls. This may include working with a physical therapist or trainer to improve your strength, balance, and endurance. This information is not intended to replace advice given to you by your health care provider. Make sure you discuss any questions you have with your health care provider. Document Released: 05/01/2002 Document Revised: 10/08/2015 Document Reviewed: 06/15/2014 Elsevier Interactive Patient Education  2017 [employer].

## 2016-04-30 ENCOUNTER — Encounter: Payer: Self-pay | Admitting: Internal Medicine

## 2016-04-30 ENCOUNTER — Telehealth: Payer: Self-pay | Admitting: Internal Medicine

## 2016-04-30 NOTE — Telephone Encounter (Signed)
Caller name: Relationship to patient: Self Can be reached: [phone] Pharmacy:  Reason for call: Patient called to inform provider that taking the Lexapro did make her drowsy when she took it in the mornings so she has resumed taking it at night.

## 2016-04-30 NOTE — Telephone Encounter (Signed)
FYI

## 2016-04-30 NOTE — Telephone Encounter (Signed)
Thanks

## 2016-05-07 ENCOUNTER — Encounter: Payer: Self-pay | Admitting: Pulmonary Disease

## 2016-05-07 ENCOUNTER — Other Ambulatory Visit: Payer: Self-pay | Admitting: Internal Medicine

## 2016-05-07 ENCOUNTER — Ambulatory Visit (INDEPENDENT_AMBULATORY_CARE_PROVIDER_SITE_OTHER): Payer: Medicare Other | Admitting: Pulmonary Disease

## 2016-05-07 VITALS — BP 126/58 | HR 109 | Temp 98.5°F | Ht 63.0 in | Wt 103.6 lb

## 2016-05-07 DIAGNOSIS — J9612 Chronic respiratory failure with hypercapnia: Secondary | ICD-10-CM | POA: Diagnosis not present

## 2016-05-07 DIAGNOSIS — J432 Centrilobular emphysema: Secondary | ICD-10-CM | POA: Diagnosis not present

## 2016-05-07 MED ORDER — FLUTTER DEVI: 0 refills | Status: AC

## 2016-05-07 MED ORDER — AZITHROMYCIN 250 MG PO TABS: 250.0000 mg | ORAL_TABLET | Freq: Every day | ORAL | 3 refills | Status: DC

## 2016-05-07 NOTE — Patient Instructions (Addendum)
For your advanced COPD with recurrent exacerbations: Keep taking your inhale therapy as you're doing Start taking azithromycin 250 mg daily no matter how you feel Use a flutter valve 4-5 breaths, 4-5 times a day to help you clear the mucus Keep using Mucinex as you're doing Call us if you're not improving We'll have you come back in 4 weeks and we will get a 12-lead EKG as well as a liver function test at that point to make sure there's no problems with the azithromycin  For your chronic respiratory fair with hypoxemia: Keep using oxygen as you're doing for liters at rest, 6 L with exertion.  We will see you back in 4 weeks or sooner if needed

## 2016-05-07 NOTE — Progress Notes (Signed)
Subjective:    Patient ID: Helen Scott, female    DOB: 02-Apr-1951, 65 y.o.   MRN: [id number]  Synopsis: Former [facility] patient with GOLD D COPD pfts 2010:  FEV1 0.49 (ratio 27%). O2 dependent Transplant eval at [facility]--told too good, 2010 Hospitalized 2009 for COPD exacerbation and started on 2 L O2 As of 2016 has been using 3LPM O2 since 2015   HPI Chief Complaint  Patient presents with  . Follow-up    pt c/o chest congestion, prod cough with clear/white mucus, runny nose, pnd, hoarseness.     Helen Scott says that she felt better after we gave her the prednisone and antibiotics but she still has some chest congestion which has increased lately.  She has not been exercising.  She has been taking mucinex regularly.    She has been around some family members that were sick too (sister).  She is trying to stay away from public places to stay healthy.    She says that she finally contacted the [facility] for [facility] for home hospice.  She was told that she should keep her same insurance for now.     Past Medical History:  Diagnosis Date  . CVA (cerebral infarction) 06/1999  . Emphysema    FEV1 0.49, FEV1% 27 on 5/10  . Hyperlipidemia   . OSTEOPENIA 03/27/2009  . PUD (peptic ulcer disease)     s/p partial gastrectomy 1985.        Review of Systems  Constitutional: Positive for unexpected weight change. Negative for chills, fatigue and fever.  HENT: Negative for postnasal drip, rhinorrhea and sinus pressure.   Respiratory: Positive for cough and shortness of breath. Negative for wheezing.   Cardiovascular: Negative for chest pain, palpitations and leg swelling.       Objective:   Physical Exam  Vitals:   05/07/16 1430  BP: (!) 126/58  Pulse: (!) 109  Temp: 98.5 F (36.9 C)  TempSrc: Oral  SpO2: 91%  Weight: 103 lb 9.6 oz (47 kg)  Height: 5\' 3"  (1.6 m)   4L O2  Gen: chronically ill appearing HENT: OP clear, TM's clear, neck supple PULM: limited air movement  some rhonchi bilaterally CV: RRR, no mgr, trace edema GI: BS+, soft, nontender Derm: no cyanosis or rash Psyche: normal mood and affect  December 2016 sputum culture results reviewed, AFB negative  04/2015 Sputum culture> pan sensitive Pseudomonas  BMET    Component Value Date/Time   NA 143 04/28/2016 1051   K 4.0 04/28/2016 1051   CL 96 04/28/2016 1051   CO2 42 (H) 04/28/2016 1051   GLUCOSE 111 (H) 04/28/2016 1051   BUN 11 04/28/2016 1051   CREATININE 0.55 04/28/2016 1051   CALCIUM 9.2 04/28/2016 1051   GFRNONAA 93.77 02/21/2010 1027   GFRAA 111 02/27/2008 0858        Assessment & Plan:  Impression: Advanced COPD Recurrent COPD exacerbations Chronic respiratory thyroid hypoxemia  Discussion: As stated previously, Helen Scott has severe COPD, near end-stage. She's been having recurrent exacerbations this year. I would like for her to be able to get into a transitional type hospice program but we struggled with this this year.  Plan: For your advanced COPD with recurrent exacerbations: Keep taking your inhale therapy as you're doing Start taking azithromycin 250 mg daily no matter how you feel Use a flutter valve 4-5 breaths, 4-5 times a day to help you clear the mucus Keep using Mucinex as you're doing Call us if you're  not improving We'll have you come back in 4 weeks and we will get a 12-lead EKG as well as a liver function test at that point to make sure there's no problems with the azithromycin  For your chronic respiratory fair with hypoxemia: Keep using oxygen as you're doing for liters at rest, 6 L with exertion.  We will see you back in 4 weeks or sooner if needed   Current Outpatient Prescriptions:  .  albuterol (VENTOLIN HFA) 108 (90 Base) MCG/ACT inhaler, Inhale 1-2 puffs into the lungs every 4 (four) hours as needed for wheezing or shortness of breath., Disp: 18 g, Rfl: 5 .  Calcium Carbonate-Vitamin D (CALCIUM-CARB 600 + D) 600-125 MG-UNIT TABS, Take 1  tablet by mouth 2 (two) times daily.  , Disp: , Rfl:  .  Cholecalciferol (VITAMIN D3) 1000 UNITS CAPS, Take 1 capsule by mouth daily.  , Disp: , Rfl:  .  clopidogrel (PLAVIX) 75 MG tablet, Take 1 tablet (75 mg total) by mouth daily., Disp: 90 tablet, Rfl: 3 .  denosumab (PROLIA) 60 MG/ML SOLN injection, Inject 60 mg into the skin every 6 (six) months. Administer in upper arm, thigh, or abdomen, Disp: 1.8 mL, Rfl: 0 .  DULERA 100-5 MCG/ACT AERO, INHALE 2 PUFFS BY MOUTH TWICE DAILY, Disp: 39 g, Rfl: 3 .  escitalopram (LEXAPRO) 10 MG tablet, Take 0.5 tablets (5 mg total) by mouth daily., Disp: 45 tablet, Rfl: 2 .  simvastatin (ZOCOR) 40 MG tablet, Take 1 tablet (40 mg total) by mouth at bedtime., Disp: 90 tablet, Rfl: 1 .  SPIRIVA HANDIHALER 18 MCG inhalation capsule, INHALE THE CONTENTS OF 1 CAPSULE VIA INHALATION DEVICE EVERY DAY, Disp: 90 capsule, Rfl: 3

## 2016-05-29 ENCOUNTER — Telehealth: Payer: Self-pay | Admitting: Internal Medicine

## 2016-05-29 NOTE — Telephone Encounter (Signed)
Opened in error

## 2016-05-29 NOTE — Telephone Encounter (Signed)
Patient called requesting a call back. She did not want to leave any information as to what the call was regarding. Please advise   Phone: [phone]

## 2016-05-29 NOTE — Telephone Encounter (Signed)
Called and spoke w/ Pt, she will be due for next Prolia injection on 06/05/2016. Will start insurance verification. Once verification has been received, Rx for Prolia will be sent to [facility] for Pt.

## 2016-06-02 MED ORDER — DENOSUMAB 60 MG/ML ~~LOC~~ SOLN: 60.0000 mg | SUBCUTANEOUS | 0 refills | Status: DC

## 2016-06-02 NOTE — Telephone Encounter (Signed)
Spoke w/ Pt, informed we have received Prolia benefits. Prolia prescription faxed to [facility].

## 2016-06-02 NOTE — Telephone Encounter (Signed)
Benefits for Prolia received NO PA required 20% co-insurance for Admin fee and Prolia $10 copay if Office visit Patient will approximately $220 out of pocket

## 2016-06-02 NOTE — Addendum Note (Signed)
Addended byDamita Dunnings D on: 06/02/2016 04:39 PM   Modules accepted: Orders

## 2016-06-03 ENCOUNTER — Telehealth: Payer: Self-pay | Admitting: Internal Medicine

## 2016-06-03 NOTE — Telephone Encounter (Signed)
noted 

## 2016-06-03 NOTE — Telephone Encounter (Signed)
FYI

## 2016-06-03 NOTE — Telephone Encounter (Signed)
Pt came in office stating that had an appt for her Prolia shot (06-03-16), pt did not have an appt but did bring in her prolia injection that she got at the pharmacy as told from our office yesterday that prescription was sent to pharmacy and pt was informed that was due for her Prolia inject on Friday 06-05-16, (this was verify with Vilma Prader), pt informed that drove from home a long distance to get here and that on Friday it was going to be raining and it was going to be impossible for her to get here on Friday 06-05-16, pt was given the option to come next wk or the day that she could come in after any day from Friday 06-05-16, pt got mad and insisted wanted to have it done today, pt was inform it can be done but that insurance will not cover the visit for today 06-03-16 since insurance will cover 6 month + 1 and her last one was done on 12-03-2015 and that she would be responsible for any services that insurance will not cover, pt got mad and stated that she will fine another place to have it done.

## 2016-06-05 ENCOUNTER — Ambulatory Visit (INDEPENDENT_AMBULATORY_CARE_PROVIDER_SITE_OTHER): Payer: Medicare Other | Admitting: Behavioral Health

## 2016-06-05 ENCOUNTER — Encounter: Payer: Self-pay | Admitting: Internal Medicine

## 2016-06-05 DIAGNOSIS — M810 Age-related osteoporosis without current pathological fracture: Secondary | ICD-10-CM | POA: Diagnosis not present

## 2016-06-05 MED ORDER — DENOSUMAB 60 MG/ML ~~LOC~~ SOLN
60.0000 mg | Freq: Once | SUBCUTANEOUS | Status: AC
  Administered 2016-06-05: 60 mg via SUBCUTANEOUS

## 2016-06-05 NOTE — Telephone Encounter (Signed)
Prolia injection given today (06/05/16).

## 2016-06-05 NOTE — Telephone Encounter (Signed)
error:[id number] ° °

## 2016-06-05 NOTE — Progress Notes (Addendum)
Pre visit review using our clinic review tool, if applicable. No additional management support is needed unless otherwise documented below in the visit note.  Patient presents in clinic for Prolia injection. SQ given in Left Arm. Patient tolerated injection well. No signs or symptoms of a reaction prior to leaving the nurse visit.   Previous  BMPs reviewed before the injection, normal renal fx and Ca+ Kathlene November, MD

## 2016-06-18 ENCOUNTER — Other Ambulatory Visit (INDEPENDENT_AMBULATORY_CARE_PROVIDER_SITE_OTHER): Payer: Medicare Other

## 2016-06-18 ENCOUNTER — Ambulatory Visit (INDEPENDENT_AMBULATORY_CARE_PROVIDER_SITE_OTHER): Payer: Medicare Other | Admitting: Pulmonary Disease

## 2016-06-18 ENCOUNTER — Encounter: Payer: Self-pay | Admitting: Pulmonary Disease

## 2016-06-18 DIAGNOSIS — J411 Mucopurulent chronic bronchitis: Secondary | ICD-10-CM | POA: Diagnosis not present

## 2016-06-18 DIAGNOSIS — J9612 Chronic respiratory failure with hypercapnia: Secondary | ICD-10-CM

## 2016-06-18 DIAGNOSIS — Z5181 Encounter for therapeutic drug level monitoring: Secondary | ICD-10-CM

## 2016-06-18 LAB — COMPREHENSIVE METABOLIC PANEL
ALT: 9 U/L (ref 0–35)
AST: 17 U/L (ref 0–37)
Albumin: 4.2 g/dL (ref 3.5–5.2)
Alkaline Phosphatase: 46 U/L (ref 39–117)
BILIRUBIN TOTAL: 0.3 mg/dL (ref 0.2–1.2)
BUN: 9 mg/dL (ref 6–23)
CO2: 43 meq/L — AB (ref 19–32)
Calcium: 9.4 mg/dL (ref 8.4–10.5)
Chloride: 95 mEq/L — ABNORMAL LOW (ref 96–112)
Creatinine, Ser: 0.47 mg/dL (ref 0.40–1.20)
GFR: 140.73 mL/min (ref >60.00)
GLUCOSE: 87 mg/dL (ref 70–99)
Potassium: 3.9 mEq/L (ref 3.5–5.1)
SODIUM: 141 meq/L (ref 135–145)
Total Protein: 7 g/dL (ref 6.0–8.3)

## 2016-06-18 NOTE — Addendum Note (Signed)
Addended by: Rosana Berger on: 06/18/2016 02:44 PM   Modules accepted: Orders

## 2016-06-18 NOTE — Addendum Note (Signed)
Addended by: Valerie Salts on: 06/18/2016 02:57 PM   Modules accepted: Orders

## 2016-06-18 NOTE — Progress Notes (Signed)
Subjective:    Patient ID: Helen Scott, female    DOB: 06/29/1950, 66 y.o.   MRN: [id number]  Synopsis: Former [facility] patient with GOLD D COPD pfts 2010:  FEV1 0.49 (ratio 27%). O2 dependent Transplant eval at [facility]--told too good, 2010 Hospitalized 2009 for COPD exacerbation and started on 2 L O2 As of 2016 has been using 3LPM O2 since 2015   HPI Chief Complaint  Patient presents with  . Follow-up    4wk rov- pt states breathing has slightly improved since last OV. pt c/o cont sob with exertion, prod cough with clear mucus & chest discomfort with coughing. on 2L cont.    Mlissa says that she feels better since starting the azithromycin.  She feels less mucus in her chest with this and the flutter valve.  She uses the flutter valve 2 - 3 times per day, sometimes more.  She has produced a lot of mucus with that.  No changes in her oxygen requirement and no flares of COPD requiring prednisone since the last visit.     She has not been out of the house a lot late because of the influenza. She has her family and meals on wheels bring in food to her which is helpful.    No new heart palpitations or fluttering heart rate since starting the azithromycin.    Past Medical History:  Diagnosis Date  . CVA (cerebral infarction) 06/1999  . Emphysema    FEV1 0.49, FEV1% 27 on 5/10  . Hyperlipidemia   . OSTEOPENIA 03/27/2009  . PUD (peptic ulcer disease)     s/p partial gastrectomy 1985.        Review of Systems  Constitutional: Positive for unexpected weight change. Negative for chills, fatigue and fever.  HENT: Negative for postnasal drip, rhinorrhea and sinus pressure.   Respiratory: Positive for cough and shortness of breath. Negative for wheezing.   Cardiovascular: Negative for chest pain, palpitations and leg swelling.       Objective:   Physical Exam  Vitals:   06/18/16 1359  BP: 136/62  Pulse: (!) 108  SpO2: 98%  Weight: 47.6 kg (105 lb)  Height: 5\' 3"  (1.6 m)    4L O2  Gen: chronically ill appearing HENT: OP clear, TM's clear, neck supple PULM: limited air movement, no wheezing CV: RRR, no mgr, trace edema GI: BS+, soft, nontender Derm: no cyanosis or rash Psyche: normal mood and affect    BMET    Component Value Date/Time   NA 143 04/28/2016 1051   K 4.0 04/28/2016 1051   CL 96 04/28/2016 1051   CO2 42 (H) 04/28/2016 1051   GLUCOSE 111 (H) 04/28/2016 1051   BUN 11 04/28/2016 1051   CREATININE 0.55 04/28/2016 1051   CALCIUM 9.2 04/28/2016 1051   GFRNONAA 93.77 02/21/2010 1027   GFRAA 111 02/27/2008 0858        Assessment & Plan:    COPD (chronic obstructive pulmonary disease) ([place]) This has been a stable interval for Helen Scott since starting azithromycin. She does not appear to have had any side effects but we need to monitor for liver toxicity and cardiac toxicity.  She remains symptomatic from her very severe COPD but in general this is been a stable interval.  Plan: Continue daily azithromycin 250 mg daily Check liver function tests today to monitor for drug toxicity Check 12-lead EKG today to monitor for drug toxicity Continue Dulera and Spiriva   CHRONIC RESPIRATORY FAILURE Continue 3 L  of oxygen continuously    Current Outpatient Prescriptions:  .  albuterol (VENTOLIN HFA) 108 (90 Base) MCG/ACT inhaler, Inhale 1-2 puffs into the lungs every 4 (four) hours as needed for wheezing or shortness of breath., Disp: 18 g, Rfl: 5 .  azithromycin (ZITHROMAX) 250 MG tablet, Take 1 tablet (250 mg total) by mouth daily., Disp: 30 tablet, Rfl: 3 .  Calcium Carbonate-Vitamin D (CALCIUM-CARB 600 + D) 600-125 MG-UNIT TABS, Take 1 tablet by mouth 2 (two) times daily.  , Disp: , Rfl:  .  Cholecalciferol (VITAMIN D3) 1000 UNITS CAPS, Take 1 capsule by mouth daily.  , Disp: , Rfl:  .  clopidogrel (PLAVIX) 75 MG tablet, Take 1 tablet (75 mg total) by mouth daily., Disp: 90 tablet, Rfl: 3 .  denosumab (PROLIA) 60 MG/ML SOLN injection,  Inject 60 mg into the skin every 6 (six) months. Administer in upper arm, thigh, or abdomen, Disp: 1.8 mL, Rfl: 0 .  DULERA 100-5 MCG/ACT AERO, INHALE 2 PUFFS BY MOUTH TWICE DAILY, Disp: 39 g, Rfl: 3 .  escitalopram (LEXAPRO) 10 MG tablet, Take 0.5 tablets (5 mg total) by mouth daily., Disp: 45 tablet, Rfl: 2 .  Respiratory Therapy Supplies (FLUTTER) DEVI, Use as directed, Disp: 1 each, Rfl: 0 .  simvastatin (ZOCOR) 40 MG tablet, Take 1 tablet (40 mg total) by mouth at bedtime., Disp: 90 tablet, Rfl: 1 .  SPIRIVA HANDIHALER 18 MCG inhalation capsule, INHALE THE CONTENTS OF 1 CAPSULE VIA INHALATION DEVICE EVERY DAY, Disp: 90 capsule, Rfl: 3

## 2016-06-18 NOTE — Patient Instructions (Signed)
Keep taking your medications as you are doing We will call you with the results of today's bloodwork We will see you back in 2 months or sooner if needed

## 2016-06-18 NOTE — Addendum Note (Signed)
Addended by: Maryanna Shape A on: 06/18/2016 02:30 PM   Modules accepted: Orders

## 2016-06-18 NOTE — Assessment & Plan Note (Signed)
This has been a stable interval for Helen Scott since starting azithromycin. She does not appear to have had any side effects but we need to monitor for liver toxicity and cardiac toxicity.  She remains symptomatic from her very severe COPD but in general this is been a stable interval.  Plan: Continue daily azithromycin 250 mg daily Check liver function tests today to monitor for drug toxicity Check 12-lead EKG today to monitor for drug toxicity Continue Dulera and Spiriva

## 2016-06-18 NOTE — Assessment & Plan Note (Signed)
Continue 3 L of oxygen continuously 

## 2016-06-19 DIAGNOSIS — H04123 Dry eye syndrome of bilateral lacrimal glands: Secondary | ICD-10-CM | POA: Diagnosis not present

## 2016-08-02 ENCOUNTER — Other Ambulatory Visit: Payer: Self-pay | Admitting: Pulmonary Disease

## 2016-08-18 ENCOUNTER — Ambulatory Visit: Payer: Self-pay | Admitting: Pulmonary Disease

## 2016-08-20 ENCOUNTER — Ambulatory Visit: Payer: Self-pay | Admitting: Pulmonary Disease

## 2016-08-31 ENCOUNTER — Other Ambulatory Visit: Payer: Self-pay | Admitting: Internal Medicine

## 2016-09-04 ENCOUNTER — Telehealth: Payer: Self-pay | Admitting: Pulmonary Disease

## 2016-09-04 MED ORDER — AZITHROMYCIN 250 MG PO TABS: 250.0000 mg | ORAL_TABLET | Freq: Every day | ORAL | 3 refills | Status: DC

## 2016-09-04 NOTE — Telephone Encounter (Signed)
Yes refill azithro  If left leg swelling worsens over weekend then urgent care or PCP sooner

## 2016-09-04 NOTE — Telephone Encounter (Signed)
Called and spoke with pt and she is aware of rx that has been sent to the pharmacy.  Nothing further is needed.  

## 2016-09-04 NOTE — Telephone Encounter (Signed)
Called and spoke with pt and she stated that there are no refills on her azithromycin and she wanted to see if she needed to stay on this med and if so she will need a refill sent in.  Pt also stated that she has never experienced swelling in her ankles/feet, but the last 2 days she has had this and the right foot is back to normal but the left foot is still swollen some.  She does have appt next Thursday but wanted to mention to see if anything further needed to be done.  BQ please advise. Thanks  Allergies  Allergen Reactions  . Latex Other (See Comments)    Only Latex Tape causes Rash and abrasive

## 2016-09-10 ENCOUNTER — Encounter: Payer: Self-pay | Admitting: Pulmonary Disease

## 2016-09-10 ENCOUNTER — Ambulatory Visit (INDEPENDENT_AMBULATORY_CARE_PROVIDER_SITE_OTHER): Payer: Medicare Other | Admitting: Pulmonary Disease

## 2016-09-10 DIAGNOSIS — H60391 Other infective otitis externa, right ear: Secondary | ICD-10-CM | POA: Diagnosis not present

## 2016-09-10 DIAGNOSIS — J411 Mucopurulent chronic bronchitis: Secondary | ICD-10-CM | POA: Diagnosis not present

## 2016-09-10 DIAGNOSIS — J9612 Chronic respiratory failure with hypercapnia: Secondary | ICD-10-CM

## 2016-09-10 DIAGNOSIS — H60399 Other infective otitis externa, unspecified ear: Secondary | ICD-10-CM | POA: Insufficient documentation

## 2016-09-10 MED ORDER — ACETIC ACID 2 % OT SOLN: 4.0000 [drp] | Freq: Three times a day (TID) | OTIC | 0 refills | Status: DC

## 2016-09-10 NOTE — Assessment & Plan Note (Signed)
There is severe disease with recurrent exacerbations but doing stable on daily azithromycin.  Plan: Continue Dulera twice a day, Spiriva daily Continue azithromycin daily Liver function test next visit

## 2016-09-10 NOTE — Progress Notes (Signed)
Subjective:    Patient ID: Helen Scott, female    DOB: [birth year], 66 y.o.   MRN: [id number]  Synopsis: Former [facility] patient with GOLD D COPD O2 dependent Transplant eval at [facility]--told too good, 2010 Hospitalized 2009 for COPD exacerbation and started on 2 L O2 As of 2016 has been using 3LPM O2 since 2015   HPI Chief Complaint  Patient presents with  . Follow-up    Pt states breathing has been well since last visit. Has clear to yellow mucus cough. Denies SOB and  chest tightness.    Helen Scott has not been out of the house much since the last visit much.  She says that she has not been working out much, she has not been to church.  She says that she has not been having much trouble.  She says that her feet started swelling a bout a week ago.  She says that her feet were swelling over her tennis shoes when she was on her feet lately. She has been eating more salt recently because she is eating meals on wheels, but she reports no new medicines recently.  She started drinking more water. She wasn't really more short of breath than normal. Cough is the same, no changes to inhalers or oxygen.    Past Medical History:  Diagnosis Date  . CVA (cerebral infarction) 06/1999  . Emphysema    FEV1 0.49, FEV1% 27 on 5/10  . Hyperlipidemia   . OSTEOPENIA 03/27/2009  . PUD (peptic ulcer disease)     s/p partial gastrectomy 1985.        Review of Systems  Constitutional: Positive for unexpected weight change. Negative for chills, fatigue and fever.  HENT: Negative for postnasal drip, rhinorrhea and sinus pressure.   Respiratory: Positive for cough and shortness of breath. Negative for wheezing.   Cardiovascular: Negative for chest pain, palpitations and leg swelling.       Objective:   Physical Exam  Vitals:   09/10/16 1500  BP: 124/60  Pulse: (!) 115  SpO2: 92%  Weight: 103 lb 9.6 oz (47 kg)  Height: 5\' 3"  (1.6 m)   4L O2  Gen: chronically ill appearing HENT: OP clear, R  external ear canal with redness and irritation , neck supple PULM: Crackles L base, normal effort B, normal percussion CV: RRR, no mgr, trace edema GI: BS+, soft, nontender Derm: no cyanosis or rash Psyche: normal mood and affect   pfts 2010:  FEV1 0.49 (ratio 27%).  BMET    Component Value Date/Time   NA 141 06/18/2016 1457   K 3.9 06/18/2016 1457   CL 95 (L) 06/18/2016 1457   CO2 43 (H) 06/18/2016 1457   GLUCOSE 87 06/18/2016 1457   BUN 9 06/18/2016 1457   CREATININE 0.47 06/18/2016 1457   CALCIUM 9.4 06/18/2016 1457   GFRNONAA 93.77 02/21/2010 1027   GFRAA 111 02/27/2008 0858        Assessment & Plan:    Otitis, externa, infective She has a mild case of otitis externa, likely mostly just a fungal infection causing superficial redness and itching.  Plan: Acetic acid drops  COPD (chronic obstructive pulmonary disease) (HCC) There is severe disease with recurrent exacerbations but doing stable on daily azithromycin.  Plan: Continue Dulera twice a day, Spiriva daily Continue azithromycin daily Liver function test next visit  CHRONIC RESPIRATORY FAILURE 3L rest, 4L exertion    Current Outpatient Prescriptions:  .  albuterol (VENTOLIN HFA) 108 (90 Base) MCG/ACT  inhaler, Inhale 1-2 puffs into the lungs every 4 (four) hours as needed for wheezing or shortness of breath., Disp: 18 g, Rfl: 5 .  azithromycin (ZITHROMAX) 250 MG tablet, Take 1 tablet (250 mg total) by mouth daily., Disp: 30 tablet, Rfl: 3 .  Calcium Carbonate-Vitamin D (CALCIUM-CARB 600 + D) 600-125 MG-UNIT TABS, Take 1 tablet by mouth 2 (two) times daily.  , Disp: , Rfl:  .  Cholecalciferol (VITAMIN D3) 1000 UNITS CAPS, Take 1 capsule by mouth daily.  , Disp: , Rfl:  .  clopidogrel (PLAVIX) 75 MG tablet, Take 1 tablet (75 mg total) by mouth daily., Disp: 90 tablet, Rfl: 3 .  denosumab (PROLIA) 60 MG/ML SOLN injection, Inject 60 mg into the skin every 6 (six) months. Administer in upper arm, thigh, or  abdomen, Disp: 1.8 mL, Rfl: 0 .  DULERA 100-5 MCG/ACT AERO, INHALE 2 PUFFS BY MOUTH TWICE DAILY, Disp: 39 g, Rfl: 1 .  escitalopram (LEXAPRO) 10 MG tablet, Take 0.5 tablets (5 mg total) by mouth daily., Disp: 45 tablet, Rfl: 1 .  Respiratory Therapy Supplies (FLUTTER) DEVI, Use as directed, Disp: 1 each, Rfl: 0 .  simvastatin (ZOCOR) 40 MG tablet, Take 1 tablet (40 mg total) by mouth at bedtime., Disp: 90 tablet, Rfl: 1 .  SPIRIVA HANDIHALER 18 MCG inhalation capsule, INHALE THE CONTENTS OF 1 CAPSULE VIA INHALATION DEVICE EVERY DAY, Disp: 90 capsule, Rfl: 1

## 2016-09-10 NOTE — Assessment & Plan Note (Signed)
3L rest, 4L exertion

## 2016-09-10 NOTE — Assessment & Plan Note (Addendum)
New problem She has a mild case of otitis externa, likely mostly just a fungal infection causing superficial redness and itching.  Plan: Acetic acid drops

## 2016-09-10 NOTE — Patient Instructions (Signed)
Keep using 3 L O2 at rest, 4 L with exertion Use the acetic acid ear drops in the R ear with a cotton swab for a few days Keep taking your inhaled medicines as you are doing Stay active We will see you back in 3 months or sooner if needed

## 2016-09-30 ENCOUNTER — Other Ambulatory Visit: Payer: Self-pay

## 2016-09-30 MED ORDER — SIMVASTATIN 40 MG PO TABS: 40.0000 mg | ORAL_TABLET | Freq: Every day | ORAL | 1 refills | Status: DC

## 2016-10-27 ENCOUNTER — Ambulatory Visit: Payer: Self-pay | Admitting: Internal Medicine

## 2016-10-28 ENCOUNTER — Telehealth: Payer: Self-pay

## 2016-10-28 ENCOUNTER — Encounter: Payer: Self-pay | Admitting: Internal Medicine

## 2016-10-28 ENCOUNTER — Ambulatory Visit (INDEPENDENT_AMBULATORY_CARE_PROVIDER_SITE_OTHER): Payer: Medicare Other | Admitting: Internal Medicine

## 2016-10-28 VITALS — BP 122/78 | HR 102 | Temp 98.1°F | Resp 14 | Ht 63.0 in | Wt 102.5 lb

## 2016-10-28 DIAGNOSIS — J432 Centrilobular emphysema: Secondary | ICD-10-CM | POA: Diagnosis not present

## 2016-10-28 DIAGNOSIS — F32A Depression, unspecified: Secondary | ICD-10-CM

## 2016-10-28 DIAGNOSIS — F329 Major depressive disorder, single episode, unspecified: Secondary | ICD-10-CM

## 2016-10-28 DIAGNOSIS — M810 Age-related osteoporosis without current pathological fracture: Secondary | ICD-10-CM

## 2016-10-28 NOTE — Patient Instructions (Signed)
   GO TO THE FRONT DESK Schedule your next appointment for a  Physical exam, fasting in 6 months

## 2016-10-28 NOTE — Telephone Encounter (Signed)
PROLIA due after 12/03/2016, can we initiate insurance re-verification please?

## 2016-10-28 NOTE — Progress Notes (Signed)
Subjective:    Patient ID: Helen Scott, female    DOB: 05-20-1951, 66 y.o.   MRN: [id number]  DOS:  10/28/2016 Type of visit - description : ROV Interval history: No conerns. Stable Depression- controlled resp sx at baseline. Med list reviewed, good compliance Labs reviewed, not due for any labs    Review of Systems Denies lower extremity edema or palpitations Cough at baseline, no hemoptysis. No fever chills.   Past Medical History:  Diagnosis Date  . CVA (cerebral infarction) 06/1999  . Emphysema    FEV1 0.49, FEV1% 27 on 5/10  . Hyperlipidemia   . OSTEOPENIA 03/27/2009  . PUD (peptic ulcer disease)     s/p partial gastrectomy 1985.      Past Surgical History:  Procedure Laterality Date  . BREAST BIOPSY  80s   R breast   . PARTIAL GASTRECTOMY      s/p partial gastrectomy 1985.    . TUBAL LIGATION  1980s    Social History   Social History  . Marital status: Single    Spouse name: N/A  . Number of children: 2  . Years of education: N/A   Occupational History  . disability,previously worked in Therapist, art. [employer]   Social History Main Topics  . Smoking status: Former Smoker    Packs/day: 1.50    Years: 40.00    Types: Cigarettes    Quit date: 05/25/2005  . Smokeless tobacco: Never Used     Comment: 1 ppd started at age 78  . Alcohol use No     Comment: rarely  . Drug use: No  . Sexual activity: Not on file   Other Topics Concern  . Not on file   Social History Narrative   one living child, Blair Promise, lives in [place].    Single, lives by self      Allergies as of 10/28/2016      Reactions   Latex Other (See Comments)   Only Latex Tape causes Rash and abrasive       Medication List       Accurate as of 10/28/16 11:59 PM. Always use your most recent med list.          albuterol 108 (90 Base) MCG/ACT inhaler Commonly known as:  VENTOLIN HFA Inhale 1-2 puffs into the lungs every 4 (four) hours as needed for  wheezing or shortness of breath.   CALCIUM-CARB 600 + D 600-125 MG-UNIT Tabs Generic drug:  Calcium Carbonate-Vitamin D Take 1 tablet by mouth 2 (two) times daily.   clopidogrel 75 MG tablet Commonly known as:  PLAVIX Take 1 tablet (75 mg total) by mouth daily.   denosumab 60 MG/ML Soln injection Commonly known as:  PROLIA Inject 60 mg into the skin every 6 (six) months. Administer in upper arm, thigh, or abdomen   DULERA 100-5 MCG/ACT Aero Generic drug:  mometasone-formoterol INHALE 2 PUFFS BY MOUTH TWICE DAILY   escitalopram 10 MG tablet Commonly known as:  LEXAPRO Take 0.5 tablets (5 mg total) by mouth daily.   FLUTTER Devi Use as directed   simvastatin 40 MG tablet Commonly known as:  ZOCOR Take 1 tablet (40 mg total) by mouth at bedtime.   SPIRIVA HANDIHALER 18 MCG inhalation capsule Generic drug:  tiotropium INHALE THE CONTENTS OF 1 CAPSULE VIA INHALATION DEVICE EVERY DAY   Vitamin D3 1000 units Caps Take 1 capsule by mouth daily.  Objective:   Physical Exam BP 122/78 (BP Location: Left Arm, Patient Position: Sitting, Cuff Size: Small)   Pulse (!) 102   Temp 98.1 F (36.7 C) (Oral)   Resp 14   Ht 5\' 3"  (1.6 m)   Wt 102 lb 8 oz (46.5 kg)   SpO2 90%   BMI 18.16 kg/m  General:   Well developed, slightly under weight appearing, on oxygen. No distress HEENT:  Normocephalic . Face symmetric, atraumatic Lungs:  Decreased breath sounds Normal respiratory effort, no intercostal retractions, no accessory muscle use. Heart: RRR,  no murmur.  No pretibial edema bilaterally  Skin: Not pale. Not jaundice Neurologic:  alert & oriented X3.  Speech normal, gait appropriate for age and unassisted Psych--  Cognition and judgment appear intact.  Cooperative with normal attention span and concentration.  Behavior appropriate. No anxious or depressed appearing.      Assessment & Plan:   Assessment COPD,Chronic respiratory failure ----->  O2 24/7.  +DOE w/ minimal exertion Hyperlipidemia Depression w/o anxiety (occ panick episode) - started Lexapro 7-16 PUD -- partial gastrectomy 1985 Osteoporosis: t score -2.7 (2014), -2.6 (03-18-15), on boniva since~ 2011, will DC 04-2015 1st prolia 06-05-15 H/o CVA 2001  PLAN: COPD: Seems to be stable at this point. Hyperlipidemia: Continue simvastatin, last LDL satisfactory Depression occasional panic attack: Doing well with Lexapro low-dose. Osteoporosis: Due for a prolia soon, will get the PA. Continue calcium, vitamin D. Will need a follow-up DEXA due  by 02-2017. Will try to get it at the [facility] in [facility] RTC 6 months

## 2016-10-28 NOTE — Progress Notes (Signed)
Pre visit review using our clinic review tool, if applicable. No additional management support is needed unless otherwise documented below in the visit note. 

## 2016-10-29 NOTE — Telephone Encounter (Signed)
Benefits verified NO PA required Subject to 20% co-insurance for Prolia $40 copay for OV  Patient may owe approximately $200 OOP

## 2016-10-29 NOTE — Assessment & Plan Note (Signed)
COPD: Seems to be stable at this point. Hyperlipidemia: Continue simvastatin, last LDL satisfactory Depression occasional panic attack: Doing well with Lexapro low-dose. Osteoporosis: Due for a prolia soon, will get the PA. Continue calcium, vitamin D. Will need a follow-up DEXA due  by 02-2017. Will try to get it at the [facility] comprehensive women's center in [facility] RTC 6 months

## 2016-11-13 ENCOUNTER — Telehealth: Payer: Self-pay | Admitting: Internal Medicine

## 2016-11-13 DIAGNOSIS — M810 Age-related osteoporosis without current pathological fracture: Secondary | ICD-10-CM

## 2016-11-13 NOTE — Telephone Encounter (Signed)
°  Relation to CB:SWHQ Call back [phone]   Reason for call:  Patient wanted to inform PCP last bone density test was 03/18/2015 patient requesting orders for [facility], patient did state she would like bone density test around 10/24/207, please advise

## 2016-11-13 NOTE — Telephone Encounter (Signed)
Sending as Juluis Rainier for when PCP returns to office.

## 2016-11-15 NOTE — Telephone Encounter (Signed)
Arrange dexa for 02-2017 as requested

## 2016-11-16 NOTE — Telephone Encounter (Signed)
Order placed

## 2016-11-19 MED ORDER — DENOSUMAB 60 MG/ML ~~LOC~~ SOLN: 60.0000 mg | SUBCUTANEOUS | 0 refills | Status: DC

## 2016-11-19 NOTE — Telephone Encounter (Signed)
Prolia sent to [facility], spoke w/ Pt, nurse visit scheduled for 12/04/2016.

## 2016-11-30 NOTE — Telephone Encounter (Signed)
[place] [phone]   Called in to confirm in PA was approved. I informed per note below that NO PA is required. She said that they called the insurance and was told that it is. I also informed of message below from [place]. She would like for someone to look into further.

## 2016-11-30 NOTE — Telephone Encounter (Signed)
Noted, per pharmacy PA was initiated because PROLIA was sent to pharmacy early. Insurance would not cover before 12/03/2016.

## 2016-12-01 NOTE — Telephone Encounter (Signed)
PA initiated via Covermymeds; [id number]; received approval.   Effective from 12/01/2016 through 12/01/2017.

## 2016-12-04 ENCOUNTER — Ambulatory Visit (INDEPENDENT_AMBULATORY_CARE_PROVIDER_SITE_OTHER): Payer: Medicare Other | Admitting: Behavioral Health

## 2016-12-04 DIAGNOSIS — M810 Age-related osteoporosis without current pathological fracture: Secondary | ICD-10-CM

## 2016-12-04 MED ORDER — DENOSUMAB 60 MG/ML ~~LOC~~ SOLN
60.0000 mg | Freq: Once | SUBCUTANEOUS | Status: AC
  Administered 2016-12-04: 60 mg via SUBCUTANEOUS

## 2016-12-04 NOTE — Progress Notes (Addendum)
Pre visit review using our clinic review tool, if applicable. No additional management support is needed unless otherwise documented below in the visit note.  Patient came in clinic for Prolia injection. SQ injection was given in the left arm. Patient tolerated it well. No signs or symptoms of a reaction before leaving the nurse visit.  Kathlene November, MD

## 2016-12-16 ENCOUNTER — Ambulatory Visit (INDEPENDENT_AMBULATORY_CARE_PROVIDER_SITE_OTHER): Payer: Medicare Other | Admitting: Pulmonary Disease

## 2016-12-16 ENCOUNTER — Encounter: Payer: Self-pay | Admitting: Pulmonary Disease

## 2016-12-16 VITALS — BP 108/62 | HR 61 | Ht 63.0 in | Wt 104.0 lb

## 2016-12-16 DIAGNOSIS — F419 Anxiety disorder, unspecified: Secondary | ICD-10-CM

## 2016-12-16 DIAGNOSIS — E440 Moderate protein-calorie malnutrition: Secondary | ICD-10-CM | POA: Diagnosis not present

## 2016-12-16 DIAGNOSIS — J411 Mucopurulent chronic bronchitis: Secondary | ICD-10-CM | POA: Diagnosis not present

## 2016-12-16 DIAGNOSIS — J9612 Chronic respiratory failure with hypercapnia: Secondary | ICD-10-CM

## 2016-12-16 DIAGNOSIS — J432 Centrilobular emphysema: Secondary | ICD-10-CM

## 2016-12-16 DIAGNOSIS — F410 Panic disorder [episodic paroxysmal anxiety] without agoraphobia: Secondary | ICD-10-CM

## 2016-12-16 NOTE — Patient Instructions (Addendum)
For your severe COPD: Continue taking Spiriva and Dulera as you are doing Continue taking albuterol on an as needed basis for shortness of breath  For your deconditioning: Exercise as much as possible Stay active Contact pulmonary rehab and try to restart  For your anxiety and panic: Let me know if you would like to see a behavioral health specialist and I will make the referral  For your chronic respiratory failure with hypoxemia: Continue 4L O2 supplemental as you are doing  We will see you back in 3-4 months or sooner if needed

## 2016-12-16 NOTE — Progress Notes (Signed)
Subjective:    Patient ID: Helen Scott, female    DOB: 23-Aug-1950, 66 y.o.   MRN: [id number]  Synopsis: Former [facility] patient with GOLD D COPD O2 dependent Transplant eval at [facility]--told too good, 2010 Hospitalized 2009 for COPD exacerbation and started on 2 L O2 As of 2016 has been using 3LPM O2 since 2015   HPI Chief Complaint  Patient presents with  . Follow-up    pt states she is doing well, c/o stable sob with exertion worse in warm weather.     Helen Scott says she has been doing OK.  The hot weather makes her breathing a little worse.  They feel like the azithromycin is really helping because she hasn't had as much mucus production and she hasn't had an exacerbation.  She gets dyspnea when cooking, but she is able to stay active as long as she paces herself.  She is relying on family to help with groceries.   She will get panic sometimes and will lose control of her bowels. She has struggled with anxiety a lot in the last year.  She is taking Lexapro at night.  She is not seeing a behavioral health counselor anymore.     Past Medical History:  Diagnosis Date  . CVA (cerebral infarction) 06/1999  . Emphysema    FEV1 0.49, FEV1% 27 on 5/10  . Hyperlipidemia   . OSTEOPENIA 03/27/2009  . PUD (peptic ulcer disease)     s/p partial gastrectomy 1985.        Review of Systems  Constitutional: Positive for unexpected weight change. Negative for chills, fatigue and fever.  HENT: Negative for postnasal drip, rhinorrhea and sinus pressure.   Respiratory: Positive for cough and shortness of breath. Negative for wheezing.   Cardiovascular: Negative for chest pain, palpitations and leg swelling.       Objective:   Physical Exam  Vitals:   12/16/16 1509  BP: 108/62  Pulse: 61  SpO2: 100%  Weight: 104 lb (47.2 kg)  Height: 5\' 3"  (1.6 m)   4L O2  Gen: chronically ill appearing HENT: OP clear, TM's clear, neck supple PULM: Poor air movement, some wheezing upper lobes,   normal percussion CV: RRR, no mgr, trace edema GI: BS+, soft, nontender Derm: no cyanosis or rash Psyche: normal mood and affect    pfts 2010:  FEV1 0.49 (ratio 27%).  BMET    Component Value Date/Time   NA 141 06/18/2016 1457   K 3.9 06/18/2016 1457   CL 95 (L) 06/18/2016 1457   CO2 43 (H) 06/18/2016 1457   GLUCOSE 87 06/18/2016 1457   BUN 9 06/18/2016 1457   CREATININE 0.47 06/18/2016 1457   CALCIUM 9.4 06/18/2016 1457   GFRNONAA 93.77 02/21/2010 1027   GFRAA 111 02/27/2008 0858        Assessment & Plan:   Mucopurulent chronic bronchitis (HCC)  Chronic respiratory failure with hypercapnia (HCC)  Centrilobular emphysema (HCC)  Moderate protein-calorie malnutrition (HCC)  Anxiety  Panic   Discussion: This has been a stable interval for Helen Scott despite her very severe COPD with severe symptoms. Her weight has actually improved despite her known protein calorie malnutrition. She does have chronic respiratory third hypoxemia but this is also been stable as well. Because of her severe symptoms she is quite deconditioned because she's never able to exercise. I have encouraged her today to go back to pulmonary rehabilitation and to try to get out of the house more. There is no indication  for changing her medicines today.  Panic and anxiety have been quite a problem for her. We talked about this at length today. Lexapro seems to have helped some. I have encouraged her to go to behavioral health counseling.   Plan:  For your severe COPD: Continue taking Spiriva and Dulera as you are doing Continue taking albuterol on an as needed basis for shortness of breath  For your deconditioning: Exercise as much as possible Stay active Contact pulmonary rehab and try to restart  For your anxiety and panic: Let me know if you would like to see a behavioral health specialist and I will make the referral  For your chronic respiratory failure with hypoxemia: Continue 4L O2  supplemental as you are doing  We will see you back in 3-4 months or sooner if needed  >50% of this 27 minutes visit spent face to face   Current Outpatient Prescriptions:  .  albuterol (PROVENTIL) (2.5 MG/3ML) 0.083% nebulizer solution, Take 2.5 mg by nebulization every 6 (six) hours as needed for wheezing or shortness of breath., Disp: , Rfl:  .  albuterol (VENTOLIN HFA) 108 (90 Base) MCG/ACT inhaler, Inhale 1-2 puffs into the lungs every 4 (four) hours as needed for wheezing or shortness of breath., Disp: 18 g, Rfl: 5 .  Calcium Carbonate-Vitamin D (CALCIUM-CARB 600 + D) 600-125 MG-UNIT TABS, Take 1 tablet by mouth 2 (two) times daily.  , Disp: , Rfl:  .  Cholecalciferol (VITAMIN D3) 1000 UNITS CAPS, Take 1 capsule by mouth daily.  , Disp: , Rfl:  .  clopidogrel (PLAVIX) 75 MG tablet, Take 1 tablet (75 mg total) by mouth daily., Disp: 90 tablet, Rfl: 3 .  denosumab (PROLIA) 60 MG/ML SOLN injection, Inject 60 mg into the skin every 6 (six) months. Administer in upper arm, thigh, or abdomen, Disp: 1.8 mL, Rfl: 0 .  DULERA 100-5 MCG/ACT AERO, INHALE 2 PUFFS BY MOUTH TWICE DAILY, Disp: 39 g, Rfl: 1 .  escitalopram (LEXAPRO) 10 MG tablet, Take 0.5 tablets (5 mg total) by mouth daily., Disp: 45 tablet, Rfl: 1 .  Respiratory Therapy Supplies (FLUTTER) DEVI, Use as directed, Disp: 1 each, Rfl: 0 .  simvastatin (ZOCOR) 40 MG tablet, Take 1 tablet (40 mg total) by mouth at bedtime., Disp: 90 tablet, Rfl: 1 .  SPIRIVA HANDIHALER 18 MCG inhalation capsule, INHALE THE CONTENTS OF 1 CAPSULE VIA INHALATION DEVICE EVERY DAY, Disp: 90 capsule, Rfl: 1

## 2017-01-03 ENCOUNTER — Other Ambulatory Visit: Payer: Self-pay | Admitting: Internal Medicine

## 2017-01-03 ENCOUNTER — Other Ambulatory Visit: Payer: Self-pay | Admitting: Pulmonary Disease

## 2017-01-31 ENCOUNTER — Other Ambulatory Visit: Payer: Self-pay | Admitting: Pulmonary Disease

## 2017-02-03 ENCOUNTER — Telehealth: Payer: Self-pay | Admitting: Internal Medicine

## 2017-02-03 ENCOUNTER — Telehealth: Payer: Self-pay | Admitting: Pulmonary Disease

## 2017-02-03 NOTE — Telephone Encounter (Signed)
Patient scheduled with nurse for flu shot 02/10/17, patient would like to ensure its the double flu shot injection

## 2017-02-03 NOTE — Telephone Encounter (Signed)
Pt requesting high dose flu shot- note placed in appt notes.

## 2017-02-03 NOTE — Telephone Encounter (Signed)
Telephone note opened in error

## 2017-02-03 NOTE — Telephone Encounter (Signed)
Per Larose Kells- okay to schedule nurse visit.

## 2017-02-03 NOTE — Telephone Encounter (Signed)
Pt requesting appt to schedule HD flu shot.  This has been scheduled on inj schedule for next week at pt's request.  Nothing further needed.

## 2017-02-03 NOTE — Telephone Encounter (Signed)
°  Relation to BO:BOFP Call back number: Pharmacy:  Reason for call:  Patient inquiring if she can schedule her flu shot due to her having COPD, patient requested to speak with [facility] directly, please advise

## 2017-02-03 NOTE — Telephone Encounter (Deleted)
PA initiated via Covermymeds; KEY: [id number]. Awaiting determination.

## 2017-02-10 ENCOUNTER — Ambulatory Visit: Payer: Self-pay

## 2017-02-18 ENCOUNTER — Ambulatory Visit (INDEPENDENT_AMBULATORY_CARE_PROVIDER_SITE_OTHER): Payer: Medicare Other | Admitting: Behavioral Health

## 2017-02-18 DIAGNOSIS — Z23 Encounter for immunization: Secondary | ICD-10-CM | POA: Diagnosis not present

## 2017-02-18 NOTE — Progress Notes (Signed)
Pre visit review using our clinic review tool, if applicable. No additional management support is needed unless otherwise documented below in the visit note.  Patient came in clinic for influenza vaccination. IM injection was given in the left deltoid. Patient tolerated the injection well. No s/s of a reaction before leaving the nurse visit.

## 2017-02-19 ENCOUNTER — Telehealth: Payer: Self-pay | Admitting: Internal Medicine

## 2017-02-19 NOTE — Telephone Encounter (Signed)
Caller name: [place]  Relation to pt: self  Call back number: [phone] Pharmacy:  Reason for call: Pt called stating received a letter from her insurance indicating that she is due for her pneumonia shot. Pt would like to know if she is due and if so to call her to schedule an appt. Please advise.

## 2017-02-19 NOTE — Telephone Encounter (Signed)
Spoke w/ Pt, informed that per chart Pneumovax has been given 3 times (normally given every 5 years) and she has had 2 Prevnars. Pt verbalized understanding.

## 2017-02-25 ENCOUNTER — Other Ambulatory Visit: Payer: Self-pay | Admitting: Internal Medicine

## 2017-02-25 ENCOUNTER — Other Ambulatory Visit: Payer: Self-pay | Admitting: Pulmonary Disease

## 2017-03-17 DIAGNOSIS — Z780 Asymptomatic menopausal state: Secondary | ICD-10-CM | POA: Diagnosis not present

## 2017-03-17 DIAGNOSIS — M810 Age-related osteoporosis without current pathological fracture: Secondary | ICD-10-CM | POA: Diagnosis not present

## 2017-03-17 LAB — HM DEXA SCAN

## 2017-03-29 ENCOUNTER — Other Ambulatory Visit: Payer: Self-pay | Admitting: Pulmonary Disease

## 2017-04-21 ENCOUNTER — Ambulatory Visit: Payer: Medicare Other | Admitting: Pulmonary Disease

## 2017-04-21 ENCOUNTER — Encounter: Payer: Self-pay | Admitting: Pulmonary Disease

## 2017-04-21 VITALS — BP 136/68 | HR 117 | Ht 63.0 in | Wt 104.0 lb

## 2017-04-21 DIAGNOSIS — E440 Moderate protein-calorie malnutrition: Secondary | ICD-10-CM

## 2017-04-21 DIAGNOSIS — J432 Centrilobular emphysema: Secondary | ICD-10-CM

## 2017-04-21 DIAGNOSIS — J411 Mucopurulent chronic bronchitis: Secondary | ICD-10-CM

## 2017-04-21 DIAGNOSIS — J9612 Chronic respiratory failure with hypercapnia: Secondary | ICD-10-CM | POA: Diagnosis not present

## 2017-04-21 DIAGNOSIS — Z5181 Encounter for therapeutic drug level monitoring: Secondary | ICD-10-CM | POA: Diagnosis not present

## 2017-04-21 NOTE — Patient Instructions (Signed)
Severe COPD with recurrent exacerbation: Continue daily azithromycin, we will check a 12-lead EKG today to monitor for problems from that medicine Make sure your primary care physician ordered liver function testing next week Continue taking Dulera and Spiriva Stay as active as possible  Chronic respiratory failure with hypoxemia: Continue using 3 L of oxygen at rest, 4 L with exertion  Moderate protein calorie malnutrition: Try to eat as many calories as you can during the daytime, I recommend non-processed foods  We will see you back in 3 months or sooner if needed

## 2017-04-21 NOTE — Progress Notes (Signed)
Subjective:    Patient ID: Helen Scott, female    DOB: September 21, 1950, 66 y.o.   MRN: [id number]  Synopsis: Former [facility] patient with GOLD D COPD O2 dependent Transplant eval at [facility]--told too good, 2010 Hospitalized 2009 for COPD exacerbation and started on 2 L O2 As of 2016 has been using 3LPM O2 since 2015   HPI Chief Complaint  Patient presents with  . Follow-up    pt states she is doing ok, does note increased dyspnea.  pt states she has a difficult time breathing deeply in cold temps.    Helen Scott has been doing well.   No bronchitis or pneumonia since the last visit. She has been staying in the house, not getting out much. She did travel in October with her sisters, that went well. She ate out a lot. Her family pushed her around in a hweelchair.  She has been using and benefiting from her oxygen.  She has been using 4L continuous while walking.  3L with rest.   She is still taking her Dulera and Spiriva.    She still strugles with severe dyspnea that occurs with minimal activity, just walking a few steps.  She was struggling with panic attacks in the morning, but this improved when she took the lexapro in the mornings.  Past Medical History:  Diagnosis Date  . CVA (cerebral infarction) 06/1999  . Emphysema    FEV1 0.49, FEV1% 27 on 5/10  . Hyperlipidemia   . OSTEOPENIA 03/27/2009  . PUD (peptic ulcer disease)     s/p partial gastrectomy 1985.        Review of Systems  Constitutional: Positive for unexpected weight change. Negative for chills, fatigue and fever.  HENT: Negative for postnasal drip, rhinorrhea and sinus pressure.   Respiratory: Positive for cough and shortness of breath. Negative for wheezing.   Cardiovascular: Negative for chest pain, palpitations and leg swelling.       Objective:   Physical Exam  Vitals:   04/21/17 1447  BP: 136/68  Pulse: (!) 117  SpO2: 98%  Weight: 104 lb (47.2 kg)  Height: 5\' 3"  (1.6 m)   4L O2  Gen: chronically  ill appearing HENT: OP clear, TM's clear, neck supple PULM: Poor air movement, crackles bases B, normal percussion CV: RRR, no mgr, trace edema GI: BS+, soft, nontender Derm: no cyanosis or rash Psyche: normal mood and affect     pfts 2010:  FEV1 0.49 (ratio 27%).  BMET    Component Value Date/Time   NA 141 06/18/2016 1457   K 3.9 06/18/2016 1457   CL 95 (L) 06/18/2016 1457   CO2 43 (H) 06/18/2016 1457   GLUCOSE 87 06/18/2016 1457   BUN 9 06/18/2016 1457   CREATININE 0.47 06/18/2016 1457   CALCIUM 9.4 06/18/2016 1457   GFRNONAA 93.77 02/21/2010 1027   GFRAA 111 02/27/2008 0858   Last liver function testing was done in December 2017, normal     Assessment & Plan:   Mucopurulent chronic bronchitis (HCC)  Chronic respiratory failure with hypercapnia (HCC)  Centrilobular emphysema (HCC)  Moderate protein-calorie malnutrition (HCC)  Therapeutic drug monitoring - Plan: Hepatic function panel, EKG 12-Lead  Helen Scott has severe emphysema with near end-stage COPD.  Despite this this has been a stable interval for her.  She has not had an exacerbation.  She has done well with the intervention of daily azithromycin.  We need to monitor for toxicity today by checking an EKG.  We will  also ask that she have liver function testing done with her primary care physician next week to monitor for liver toxicity (low likelihood).  In general, I do not see significant progression since the last visit.  She is always near a point where we could consider hospice and we have talked about this frankly in the past.  At this point I do not see a need to make a referral today.  We will continue close monitoring.  Plan: Severe COPD with recurrent exacerbation: Continue daily azithromycin, we will check a 12-lead EKG today to monitor for problems from that medicine Make sure your primary care physician ordered liver function testing next week Continue taking Dulera and Spiriva Stay as active as  possible  Chronic respiratory failure with hypoxemia: Continue using 3 L of oxygen at rest, 4 L with exertion  Moderate protein calorie malnutrition: Try to eat as many calories as you can during the daytime, I recommend non-processed foods  We will see you back in 3 months or sooner if needed   Current Outpatient Medications:  .  albuterol (PROVENTIL) (2.5 MG/3ML) 0.083% nebulizer solution, Take 2.5 mg by nebulization every 6 (six) hours as needed for wheezing or shortness of breath., Disp: , Rfl:  .  albuterol (VENTOLIN HFA) 108 (90 Base) MCG/ACT inhaler, Inhale 1-2 puffs into the lungs every 4 (four) hours as needed for wheezing or shortness of breath., Disp: 18 g, Rfl: 5 .  azithromycin (ZITHROMAX) 250 MG tablet, TAKE 1 TABLET(250 MG) BY MOUTH DAILY, Disp: 30 tablet, Rfl: 2 .  Calcium Carbonate-Vitamin D (CALCIUM-CARB 600 + D) 600-125 MG-UNIT TABS, Take 1 tablet by mouth 2 (two) times daily.  , Disp: , Rfl:  .  Cholecalciferol (VITAMIN D3) 1000 UNITS CAPS, Take 1 capsule by mouth daily.  , Disp: , Rfl:  .  clopidogrel (PLAVIX) 75 MG tablet, Take 1 tablet (75 mg total) by mouth daily., Disp: 90 tablet, Rfl: 3 .  denosumab (PROLIA) 60 MG/ML SOLN injection, Inject 60 mg into the skin every 6 (six) months. Administer in upper arm, thigh, or abdomen, Disp: 1.8 mL, Rfl: 0 .  DULERA 100-5 MCG/ACT AERO, INHALE 2 PUFFS BY MOUTH TWICE DAILY, Disp: 39 g, Rfl: 0 .  escitalopram (LEXAPRO) 10 MG tablet, Take 0.5 tablets (5 mg total) by mouth daily., Disp: 45 tablet, Rfl: 1 .  Respiratory Therapy Supplies (FLUTTER) DEVI, Use as directed, Disp: 1 each, Rfl: 0 .  simvastatin (ZOCOR) 40 MG tablet, Take 1 tablet (40 mg total) by mouth at bedtime., Disp: 90 tablet, Rfl: 1 .  SPIRIVA HANDIHALER 18 MCG inhalation capsule, INHALE THE CONTENTS OF 1 CAPSULE VIA INHALATION DEVICE EVERY DAY, Disp: 90 capsule, Rfl: 1

## 2017-04-22 ENCOUNTER — Telehealth: Payer: Self-pay | Admitting: Pulmonary Disease

## 2017-04-22 NOTE — Telephone Encounter (Signed)
Spoke with pt, advised her to get a liver panel done with Dr. Larose Kells on 12/11. The order as placed for the lab to be done here but that is only if he Dr. Larose Kells doesn't order the panel, she can have it done at her next visit. Pt understood and nothing further is needed.

## 2017-04-26 DIAGNOSIS — Z1231 Encounter for screening mammogram for malignant neoplasm of breast: Secondary | ICD-10-CM | POA: Diagnosis not present

## 2017-04-27 ENCOUNTER — Other Ambulatory Visit: Payer: Self-pay | Admitting: Internal Medicine

## 2017-05-04 ENCOUNTER — Encounter: Payer: Self-pay | Admitting: Internal Medicine

## 2017-05-20 NOTE — Progress Notes (Signed)
Subjective:   Helen Scott is a 65 y.o. female who presents for Medicare Annual (Subsequent) preventive examination.  Review of Systems: Physical assessment deferred to PCP. Cardiac Risk Factors include: advanced age (>2men, >38 women);dyslipidemia Sleep patterns: Sleeps at least 8 hrs per night. Feels rested. Naps daily. Home Safety/Smoke Alarms: Feels safe in home. Smoke alarms in place.  Lives alone in [place].    Female:   Pap- pt declines.       Mammo- pt reports last done 03/25/17      Dexa scan- Pt reports last done 03/20/17        CCS- last reported 05/04/09-normal Objective:     Vitals: BP (!) 126/58 (BP Location: Left Arm, Patient Position: Sitting, Cuff Size: Normal)   Pulse (!) 108   Ht 5\' 3"  (1.6 m)   Wt 104 lb (47.2 kg)   SpO2 96% Comment: 4l/ [place]  BMI 18.42 kg/m   Body mass index is 18.42 kg/m.  Advanced Directives 05/21/2017  Does Patient Have a Medical Advance Directive? No  Would patient like information on creating a medical advance directive? Yes (MAU/Ambulatory/Procedural Areas - Information given)    Tobacco Social History   Tobacco Use  Smoking Status Former Smoker  . Packs/day: 1.50  . Years: 40.00  . Pack years: 60.00  . Types: Cigarettes  . Last attempt to quit: 05/25/2005  . Years since quitting: 11.9  Smokeless Tobacco Never Used  Tobacco Comment   1 ppd started at age 16     Counseling given: Not Answered Comment: 1 ppd started at age 63   Clinical Intake: Pain : No/denies pain   Past Medical History:  Diagnosis Date  . CVA (cerebral infarction) 06/1999  . Emphysema    FEV1 0.49, FEV1% 27 on 5/10  . Hyperlipidemia   . OSTEOPENIA 03/27/2009  . PUD (peptic ulcer disease)     s/p partial gastrectomy 1985.     Past Surgical History:  Procedure Laterality Date  . BREAST BIOPSY  80s   R breast   . PARTIAL GASTRECTOMY      s/p partial gastrectomy 1985.    . TUBAL LIGATION  1980s   Family History  Problem Relation Age of  Onset  . Diabetes Father 31  . Heart disease Father        MI at age 30s  . Prostate cancer Father        dx in his 82s  . Breast cancer Maternal Aunt        aunt, great aunt  and 2 first cousins  . Hypertension Neg Hx   . Stroke Neg Hx   . Colon cancer Neg Hx    Social History   Socioeconomic History  . Marital status: Single    Spouse name: None  . Number of children: 2  . Years of education: None  . Highest education level: None  Social Needs  . Financial resource strain: None  . Food insecurity - worry: None  . Food insecurity - inability: None  . Transportation needs - medical: None  . Transportation needs - non-medical: None  Occupational History  . Occupation: disability,previously worked in Therapist, art.    Employer: [employer]  Tobacco Use  . Smoking status: Former Smoker    Packs/day: 1.50    Years: 40.00    Pack years: 60.00    Types: Cigarettes    Last attempt to quit: 05/25/2005    Years since quitting: 11.9  . Smokeless  tobacco: Never Used  . Tobacco comment: 1 ppd started at age 21  Substance and Sexual Activity  . Alcohol use: No    Alcohol/week: 0.0 oz    Comment: rarely  . Drug use: No  . Sexual activity: No  Other Topics Concern  . None  Social History Narrative   one living child, [place], lives in [place] one.    Single, lives by self    Outpatient Encounter Medications as of 05/21/2017  Medication Sig  . albuterol (PROVENTIL) (2.5 MG/3ML) 0.083% nebulizer solution Take 2.5 mg by nebulization every 6 (six) hours as needed for wheezing or shortness of breath.  Marland Kitchen albuterol (VENTOLIN HFA) 108 (90 Base) MCG/ACT inhaler Inhale 1-2 puffs into the lungs every 4 (four) hours as needed for wheezing or shortness of breath.  Marland Kitchen azithromycin (ZITHROMAX) 250 MG tablet TAKE 1 TABLET(250 MG) BY MOUTH DAILY  . Calcium Carbonate-Vitamin D (CALCIUM-CARB 600 + D) 600-125 MG-UNIT TABS Take 1 tablet by mouth 2 (two) times daily.    .  Cholecalciferol (VITAMIN D3) 1000 UNITS CAPS Take 1 capsule by mouth daily.    . clopidogrel (PLAVIX) 75 MG tablet Take 1 tablet (75 mg total) by mouth daily.  Marland Kitchen denosumab (PROLIA) 60 MG/ML SOLN injection Inject 60 mg into the skin every 6 (six) months. Administer in upper arm, thigh, or abdomen  . DULERA 100-5 MCG/ACT AERO INHALE 2 PUFFS BY MOUTH TWICE DAILY  . escitalopram (LEXAPRO) 10 MG tablet Take 0.5 tablets (5 mg total) by mouth daily.  Marland Kitchen Respiratory Therapy Supplies (FLUTTER) DEVI Use as directed  . simvastatin (ZOCOR) 40 MG tablet Take 1 tablet (40 mg total) by mouth at bedtime.  Marland Kitchen SPIRIVA HANDIHALER 18 MCG inhalation capsule INHALE THE CONTENTS OF 1 CAPSULE VIA INHALATION DEVICE EVERY DAY   No facility-administered encounter medications on file as of 05/21/2017.     Activities of Daily Living In your present state of health, do you have any difficulty performing the following activities: 05/21/2017  Hearing? N  Vision? N  Comment hx of cataract sx. Dr.Fontaine yearly.  Difficulty concentrating or making decisions? N  Walking or climbing stairs? Y  Comment Sob even with oxygen  Dressing or bathing? N  Doing errands, shopping? N  Preparing Food and eating ? Y  Comment gets SOB  Using the Toilet? N  In the past six months, have you accidently leaked urine? Y  Do you have problems with loss of bowel control? Y  Comment occasioan leakage  Managing your Medications? N  Managing your Finances? N  Housekeeping or managing your Housekeeping? N  Some recent data might be hidden    Patient Care Team: Colon Branch, MD as PCP - General Meda Coffee., MD as Consulting Physician (Obstetrics and Gynecology) Juanito Doom, MD as Consulting Physician (Pulmonary Disease)    Assessment:   This is a routine wellness examination for Helen Scott. Physical assessment deferred to PCP.  Exercise Activities and Dietary recommendations Current Exercise Habits: The patient does not  participate in regular exercise at present, Exercise limited by: respiratory conditions(s)   Diet (meal preparation, eat out, water intake, caffeinated beverages, dairy products, fruits and vegetables): well balanced, on average, 3 meals per day   Goals    . Restart exercising regimen (pt-stated)       Fall Risk Fall Risk  05/21/2017 04/28/2016 12/03/2015 08/13/2015 02/12/2015  Falls in the past year? No No No No No    Depression  Screen PHQ 2/9 Scores 05/21/2017 04/28/2016 12/03/2015 08/13/2015  PHQ - 2 Score 0 0 0 2  PHQ- 9 Score - - - 5  Exception Documentation - - - Medical reason     Cognitive Function MMSE - Mini Mental State Exam 05/21/2017  Orientation to time 5  Orientation to Place 5  Registration 3  Attention/ Calculation 5  Recall 3  Language- name 2 objects 2  Language- repeat 1  Language- follow 3 step command 3  Language- read & follow direction 1  Write a sentence 1  Copy design 1  Total score 30        Immunization History  Administered Date(s) Administered  . Influenza Split 02/03/2012  . Influenza Whole 03/11/2007, 02/20/2008, 02/11/2010, 01/28/2011  . Influenza, High Dose Seasonal PF 02/12/2015, 02/10/2016, 02/18/2017  . Influenza,inj,Quad PF,6+ Mos 02/02/2013, 02/02/2014  . Pneumococcal Conjugate-13 12/08/2013, 03/10/2014  . Pneumococcal Polysaccharide-23 11/08/2003, 02/18/2009, 12/03/2015  . Td 03/11/2007  . Zoster 02/21/2010    Screening Tests Health Maintenance  Topic Date Due  . TETANUS/TDAP  03/10/2017  . MAMMOGRAM  03/25/2018  . COLONOSCOPY  05/05/2019  . INFLUENZA VACCINE  Completed  . DEXA SCAN  Completed  . Hepatitis C Screening  Completed  . PNA vac Low Risk Adult  Completed       Plan:   Follow up with Dr.Paz today as scheduled  Continue to eat heart healthy diet (full of fruits, vegetables, whole grains, lean protein, water--limit salt, fat, and sugar intake) and increase physical activity as tolerated.  Continue doing  brain stimulating activities (puzzles, reading, adult coloring books, staying active) to keep memory sharp.   Bring a copy of your living will and/or healthcare power of attorney to your next office visit.    I have personally reviewed and noted the following in the patient's chart:   . Medical and social history . Use of alcohol, tobacco or illicit drugs  . Current medications and supplements . Functional ability and status . Nutritional status . Physical activity . Advanced directives . List of other physicians . Hospitalizations, surgeries, and ER visits in previous 12 months . Vitals . Screenings to include cognitive, depression, and falls . Referrals and appointments  In addition, I have reviewed and discussed with patient certain preventive protocols, quality metrics, and best practice recommendations. A written personalized care plan for preventive services as well as general preventive health recommendations were provided to patient.     Naaman Plummer [place], [place]  05/21/2017  Kathlene November, MD

## 2017-05-21 ENCOUNTER — Ambulatory Visit (INDEPENDENT_AMBULATORY_CARE_PROVIDER_SITE_OTHER): Payer: Medicare Other | Admitting: Internal Medicine

## 2017-05-21 ENCOUNTER — Encounter: Payer: Self-pay | Admitting: Internal Medicine

## 2017-05-21 VITALS — BP 126/58 | HR 108 | Ht 63.0 in | Wt 104.0 lb

## 2017-05-21 DIAGNOSIS — Z0000 Encounter for general adult medical examination without abnormal findings: Secondary | ICD-10-CM

## 2017-05-21 DIAGNOSIS — Z8742 Personal history of other diseases of the female genital tract: Secondary | ICD-10-CM

## 2017-05-21 DIAGNOSIS — E785 Hyperlipidemia, unspecified: Secondary | ICD-10-CM

## 2017-05-21 DIAGNOSIS — D171 Benign lipomatous neoplasm of skin and subcutaneous tissue of trunk: Secondary | ICD-10-CM

## 2017-05-21 NOTE — Progress Notes (Signed)
Subjective:    Patient ID: Helen Scott, female    DOB: [birth year], 66 y.o.   MRN: [id number]  DOS:  05/21/2017 Type of visit - description : cpx Interval history: In general feeling well. Saw pulmonology recently, note reviewed. Last Prolia 10/28/2016. Had a bone density test October, results reviewed.  Wt Readings from Last 3 Encounters:  05/21/17 104 lb (47.2 kg)  04/21/17 104 lb (47.2 kg)  12/16/16 104 lb (47.2 kg)     Review of Systems Specifically denies fever chills. No nausea, vomiting, blood in the stools. Respiratory status at baseline. Does have a lipoma on the right upper shoulder, patient thinks it has increased in size and is bothering her when she lays down.  Excision?  Other than above, a 14 point review of systems is negative      Past Medical History:  Diagnosis Date  . CVA (cerebral infarction) 06/1999  . Emphysema    FEV1 0.49, FEV1% 27 on 5/10  . Hyperlipidemia   . OSTEOPENIA 03/27/2009  . PUD (peptic ulcer disease)     s/p partial gastrectomy 1985.      Past Surgical History:  Procedure Laterality Date  . BREAST BIOPSY  80s   R breast   . PARTIAL GASTRECTOMY      s/p partial gastrectomy 1985.    . TUBAL LIGATION  1980s    Social History   Socioeconomic History  . Marital status: Single    Spouse name: Not on file  . Number of children: 2  . Years of education: Not on file  . Highest education level: Not on file  Social Needs  . Financial resource strain: Not on file  . Food insecurity - worry: Not on file  . Food insecurity - inability: Not on file  . Transportation needs - medical: Not on file  . Transportation needs - non-medical: Not on file  Occupational History  . Occupation: disability,previously worked in Therapist, art.    Employer: [employer]  Tobacco Use  . Smoking status: Former Smoker    Packs/day: 1.50    Years: 40.00    Pack years: 60.00    Types: Cigarettes    Last attempt to quit: 05/25/2005    Years  since quitting: 12.0  . Smokeless tobacco: Never Used  . Tobacco comment: 1 ppd started at age 7  Substance and Sexual Activity  . Alcohol use: No    Alcohol/week: 0.0 oz    Comment: rarely  . Drug use: No  . Sexual activity: No  Other Topics Concern  . Not on file  Social History Narrative   one living child, [place], lives in [place] one.    Single, lives by self      Family History  Problem Relation Age of Onset  . Diabetes Father 42  . Heart disease Father        MI at age 47s  . Prostate cancer Father        dx in his 35s  . Breast cancer Maternal Aunt        aunt, great aunt  and 2 first cousins  . Hypertension Neg Hx   . Stroke Neg Hx   . Colon cancer Neg Hx      Allergies as of 05/21/2017      Reactions   Latex Other (See Comments)   Only Latex Tape causes Rash and abrasive       Medication List  Accurate as of 05/21/17 11:59 PM. Always use your most recent med list.          albuterol (2.5 MG/3ML) 0.083% nebulizer solution Commonly known as:  PROVENTIL Take 2.5 mg by nebulization every 6 (six) hours as needed for wheezing or shortness of breath.   albuterol 108 (90 Base) MCG/ACT inhaler Commonly known as:  VENTOLIN HFA Inhale 1-2 puffs into the lungs every 4 (four) hours as needed for wheezing or shortness of breath.   azithromycin 250 MG tablet Commonly known as:  ZITHROMAX TAKE 1 TABLET(250 MG) BY MOUTH DAILY   CALCIUM-CARB 600 + D 600-125 MG-UNIT Tabs Generic drug:  Calcium Carbonate-Vitamin D Take 1 tablet by mouth 2 (two) times daily.   clopidogrel 75 MG tablet Commonly known as:  PLAVIX Take 1 tablet (75 mg total) by mouth daily.   denosumab 60 MG/ML Soln injection Commonly known as:  PROLIA Inject 60 mg into the skin every 6 (six) months. Administer in upper arm, thigh, or abdomen   DULERA 100-5 MCG/ACT Aero Generic drug:  mometasone-formoterol INHALE 2 PUFFS BY MOUTH TWICE DAILY   escitalopram 10 MG  tablet Commonly known as:  LEXAPRO Take 0.5 tablets (5 mg total) by mouth daily.   FLUTTER Devi Use as directed   simvastatin 40 MG tablet Commonly known as:  ZOCOR Take 1 tablet (40 mg total) by mouth at bedtime.   SPIRIVA HANDIHALER 18 MCG inhalation capsule Generic drug:  tiotropium INHALE THE CONTENTS OF 1 CAPSULE VIA INHALATION DEVICE EVERY DAY   Vitamin D3 1000 units Caps Take 1 capsule by mouth daily.          Objective:   Physical Exam  Musculoskeletal:       Arms:  BP (!) 126/58 (BP Location: Left Arm, Patient Position: Sitting, Cuff Size: Normal)   Pulse (!) 108   Ht 5\' 3"  (1.6 m)   Wt 104 lb (47.2 kg)   SpO2 96% Comment: 4l/ [place]  BMI 18.42 kg/m  General:   Well developed, underweight appearing, on oxygen, no acute distress.   Neck: No  thyromegaly  HEENT:  Normocephalic . Face symmetric, atraumatic Lungs:  Decreased breath sounds, few end expiratory wheezes Normal respiratory effort, no intercostal retractions, no accessory muscle use. Heart: RRR,  no murmur.  No pretibial edema bilaterally  Abdomen:  Not distended, soft, non-tender. No rebound or rigidity.   Skin: Exposed areas without rash. Not pale. Not jaundice Neurologic:  alert & oriented X3.  Speech normal, gait appropriate for age and unassisted Strength symmetric and appropriate for age.  Psych: Cognition and judgment appear intact.  Cooperative with normal attention span and concentration.  Behavior appropriate. No anxious or depressed appearing.     Assessment & Plan:    Assessment COPD,Chronic respiratory failure ----->  O2 24/7. 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  LFTs will be checked and sent to pulmonology at his request. Hyperlipidemia: Continue simvastatin, checking labs Depression: On Lexapro, controlled. Osteoporosis: T score 02-2017: -2.8, stable, continue Prolia,  approved through 2019, next shot around 05-2017. Lipoma, shoulder: Patient would like excision, will refer to surgery for that consideration. RTC 6 months

## 2017-05-21 NOTE — Patient Instructions (Addendum)
GO TO THE LAB : Get the blood work     GO TO THE FRONT DESK Schedule your next appointment for a check up  In 6 months           Continue to eat heart healthy diet (full of fruits, vegetables, whole grains, lean protein, water--limit salt, fat, and sugar intake) and increase physical activity as tolerated.  Continue doing brain stimulating activities (puzzles, reading, adult coloring books, staying active) to keep memory sharp.   Bring a copy of your living will and/or healthcare power of attorney to your next office visit.   Helen Scott , Thank you for taking time to come for your Medicare Wellness Visit. I appreciate your ongoing commitment to your health goals. Please review the following plan we discussed and let me know if I can assist you in the future.   These are the goals we discussed: Goals    . Restart exercising regimen (pt-stated)       This is a list of the screening recommended for you and due dates:  Health Maintenance  Topic Date Due  . Tetanus Vaccine  05/21/2018*  . Mammogram  03/25/2018  . Colon Cancer Screening  05/05/2019  . Flu Shot  Completed  . DEXA scan (bone density measurement)  Completed  .  Hepatitis C: One time screening is recommended by Center for Disease Control  (CDC) for  adults born from [birth year] through 1966.   Completed  . Pneumonia vaccines  Completed  *Topic was postponed. The date shown is not the original due date.    Health Maintenance for Postmenopausal Women Menopause is a normal process in which your reproductive ability comes to an end. This process happens gradually over a span of months to years, usually between the ages of 62 and 10. Menopause is complete when you have missed 12 consecutive menstrual periods. It is important to talk with your health care provider about some of the most common conditions that affect postmenopausal women, such as heart disease, cancer, and bone loss (osteoporosis). Adopting a healthy lifestyle  and getting preventive care can help to promote your health and wellness. Those actions can also lower your chances of developing some of these common conditions. What should I know about menopause? During menopause, you may experience a number of symptoms, such as:  Moderate-to-severe hot flashes.  Night sweats.  Decrease in sex drive.  Mood swings.  Headaches.  Tiredness.  Irritability.  Memory problems.  Insomnia.  Choosing to treat or not to treat menopausal changes is an individual decision that you make with your health care provider. What should I know about hormone replacement therapy and supplements? Hormone therapy products are effective for treating symptoms that are associated with menopause, such as hot flashes and night sweats. Hormone replacement carries certain risks, especially as you become older. If you are thinking about using estrogen or estrogen with progestin treatments, discuss the benefits and risks with your health care provider. What should I know about heart disease and stroke? Heart disease, heart attack, and stroke become more likely as you age. This may be due, in part, to the hormonal changes that your body experiences during menopause. These can affect how your body processes dietary fats, triglycerides, and cholesterol. Heart attack and stroke are both medical emergencies. There are many things that you can do to help prevent heart disease and stroke:  Have your blood pressure checked at least every 1-2 years. High blood pressure causes heart disease and  increases the risk of stroke.  If you are 12-76 years old, ask your health care provider if you should take aspirin to prevent a heart attack or a stroke.  Do not use any tobacco products, including cigarettes, chewing tobacco, or electronic cigarettes. If you need help quitting, ask your health care provider.  It is important to eat a healthy diet and maintain a healthy weight. ? Be sure to  include plenty of vegetables, fruits, low-fat dairy products, and lean protein. ? Avoid eating foods that are high in solid fats, added sugars, or salt (sodium).  Get regular exercise. This is one of the most important things that you can do for your health. ? Try to exercise for at least 150 minutes each week. The type of exercise that you do should increase your heart rate and make you sweat. This is known as moderate-intensity exercise. ? Try to do strengthening exercises at least twice each week. Do these in addition to the moderate-intensity exercise.  Know your numbers.Ask your health care provider to check your cholesterol and your blood glucose. Continue to have your blood tested as directed by your health care provider.  What should I know about cancer screening? There are several types of cancer. Take the following steps to reduce your risk and to catch any cancer development as early as possible. Breast Cancer  Practice breast self-awareness. ? This means understanding how your breasts normally appear and feel. ? It also means doing regular breast self-exams. Let your health care provider know about any changes, no matter how small.  If you are 39 or older, have a clinician do a breast exam (clinical breast exam or CBE) every year. Depending on your age, family history, and medical history, it may be recommended that you also have a yearly breast X-ray (mammogram).  If you have a family history of breast cancer, talk with your health care provider about genetic screening.  If you are at high risk for breast cancer, talk with your health care provider about having an MRI and a mammogram every year.  Breast cancer (BRCA) gene test is recommended for women who have family members with BRCA-related cancers. Results of the assessment will determine the need for genetic counseling and BRCA1 and for BRCA2 testing. BRCA-related cancers include these types: ? Breast. This occurs in males  or females. ? Ovarian. ? Tubal. This may also be called fallopian tube cancer. ? Cancer of the abdominal or pelvic lining (peritoneal cancer). ? Prostate. ? Pancreatic.  Cervical, Uterine, and Ovarian Cancer Your health care provider may recommend that you be screened regularly for cancer of the pelvic organs. These include your ovaries, uterus, and vagina. This screening involves a pelvic exam, which includes checking for microscopic changes to the surface of your cervix (Pap test).  For women ages 21-65, health care providers may recommend a pelvic exam and a Pap test every three years. For women ages 14-65, they may recommend the Pap test and pelvic exam, combined with testing for human papilloma virus (HPV), every five years. Some types of HPV increase your risk of cervical cancer. Testing for HPV may also be done on women of any age who have unclear Pap test results.  Other health care providers may not recommend any screening for nonpregnant women who are considered low risk for pelvic cancer and have no symptoms. Ask your health care provider if a screening pelvic exam is right for you.  If you have had past treatment for cervical  cancer or a condition that could lead to cancer, you need Pap tests and screening for cancer for at least 20 years after your treatment. If Pap tests have been discontinued for you, your risk factors (such as having a new sexual partner) need to be reassessed to determine if you should start having screenings again. Some women have medical problems that increase the chance of getting cervical cancer. In these cases, your health care provider may recommend that you have screening and Pap tests more often.  If you have a family history of uterine cancer or ovarian cancer, talk with your health care provider about genetic screening.  If you have vaginal bleeding after reaching menopause, tell your health care provider.  There are currently no reliable tests  available to screen for ovarian cancer.  Lung Cancer Lung cancer screening is recommended for adults 28-109 years old who are at high risk for lung cancer because of a history of smoking. A yearly low-dose CT scan of the lungs is recommended if you:  Currently smoke.  Have a history of at least 30 pack-years of smoking and you currently smoke or have quit within the past 15 years. A pack-year is smoking an average of one pack of cigarettes per day for one year.  Yearly screening should:  Continue until it has been 15 years since you quit.  Stop if you develop a health problem that would prevent you from having lung cancer treatment.  Colorectal Cancer  This type of cancer can be detected and can often be prevented.  Routine colorectal cancer screening usually begins at age 46 and continues through age 35.  If you have risk factors for colon cancer, your health care provider may recommend that you be screened at an earlier age.  If you have a family history of colorectal cancer, talk with your health care provider about genetic screening.  Your health care provider may also recommend using home test kits to check for hidden blood in your stool.  A small camera at the end of a tube can be used to examine your colon directly (sigmoidoscopy or colonoscopy). This is done to check for the earliest forms of colorectal cancer.  Direct examination of the colon should be repeated every 5-10 years until age 3. However, if early forms of precancerous polyps or small growths are found or if you have a family history or genetic risk for colorectal cancer, you may need to be screened more often.  Skin Cancer  Check your skin from head to toe regularly.  Monitor any moles. Be sure to tell your health care provider: ? About any new moles or changes in moles, especially if there is a change in a mole's shape or color. ? If you have a mole that is larger than the size of a pencil eraser.  If any  of your family members has a history of skin cancer, especially at a young age, talk with your health care provider about genetic screening.  Always use sunscreen. Apply sunscreen liberally and repeatedly throughout the day.  Whenever you are outside, protect yourself by wearing long sleeves, pants, a wide-brimmed hat, and sunglasses.  What should I know about osteoporosis? Osteoporosis is a condition in which bone destruction happens more quickly than new bone creation. After menopause, you may be at an increased risk for osteoporosis. To help prevent osteoporosis or the bone fractures that can happen because of osteoporosis, the following is recommended:  If you are 55-36 years old,  get at least 1,000 mg of calcium and at least 600 mg of vitamin D per day.  If you are older than age 29 but younger than age 85, get at least 1,200 mg of calcium and at least 600 mg of vitamin D per day.  If you are older than age 36, get at least 1,200 mg of calcium and at least 800 mg of vitamin D per day.  Smoking and excessive alcohol intake increase the risk of osteoporosis. Eat foods that are rich in calcium and vitamin D, and do weight-bearing exercises several times each week as directed by your health care provider. What should I know about how menopause affects my mental health? Depression may occur at any age, but it is more common as you become older. Common symptoms of depression include:  Low or sad mood.  Changes in sleep patterns.  Changes in appetite or eating patterns.  Feeling an overall lack of motivation or enjoyment of activities that you previously enjoyed.  Frequent crying spells.  Talk with your health care provider if you think that you are experiencing depression. What should I know about immunizations? It is important that you get and maintain your immunizations. These include:  Tetanus, diphtheria, and pertussis (Tdap) booster vaccine.  Influenza every year before the flu  season begins.  Pneumonia vaccine.  Shingles vaccine.  Your health care provider may also recommend other immunizations. This information is not intended to replace advice given to you by your health care provider. Make sure you discuss any questions you have with your health care provider. Document Released: 07/03/2005 Document Revised: 11/29/2015 Document Reviewed: 02/12/2015 Elsevier Interactive Patient Education  2018 [employer].

## 2017-05-21 NOTE — Assessment & Plan Note (Addendum)
-  Td 2008, declined booster today; pneumonia shot 01-2009, shingles shot 2011, prevnar 2015, had a  flu shot already Had a shingles shot.   -Female care :  per gyn, had a Pap ~ 2014,   was abnormal, status post a biopsy (was told ok), was told to RTC, will refer her to the office of Dr Jacqlyn Larsen (retired) [facility] 04/2017-;  -CCS:  cscope   done @ [facility]  9- 2010, neg, next 10 years  -Labs: CMP, FLP, CBC, TSH. Fasting (lab is closed, will RTC for blood work)

## 2017-05-24 ENCOUNTER — Other Ambulatory Visit (INDEPENDENT_AMBULATORY_CARE_PROVIDER_SITE_OTHER): Payer: Medicare Other

## 2017-05-24 DIAGNOSIS — R7309 Other abnormal glucose: Secondary | ICD-10-CM | POA: Diagnosis not present

## 2017-05-24 DIAGNOSIS — E785 Hyperlipidemia, unspecified: Secondary | ICD-10-CM | POA: Diagnosis not present

## 2017-05-24 DIAGNOSIS — Z0000 Encounter for general adult medical examination without abnormal findings: Secondary | ICD-10-CM | POA: Diagnosis not present

## 2017-05-24 NOTE — Assessment & Plan Note (Signed)
COPD: Closely follow-up by pulmonology, on oxygen, multiple inhalers and daily Zithromax.  LFTs will be checked and sent to pulmonology at his request. Hyperlipidemia: Continue simvastatin, checking labs Depression: On Lexapro, controlled. Osteoporosis: T score 02-2017: -2.8, stable, continue Prolia,  approved through 2019, next shot around 05-2017. Lipoma, shoulder: Patient would like excision, will refer to surgery for that consideration. RTC 6 months

## 2017-05-28 ENCOUNTER — Other Ambulatory Visit: Payer: Self-pay | Admitting: Pulmonary Disease

## 2017-05-28 LAB — COMPREHENSIVE METABOLIC PANEL
AG Ratio: 1.9 (calc) (ref 1.0–2.5)
ALKALINE PHOSPHATASE (APISO): 47 U/L (ref 33–130)
ALT: 8 U/L (ref 6–29)
AST: 20 U/L (ref 10–35)
Albumin: 4.5 g/dL (ref 3.6–5.1)
BILIRUBIN TOTAL: 0.3 mg/dL (ref 0.2–1.2)
BUN: 11 mg/dL (ref 7–25)
CALCIUM: 9.3 mg/dL (ref 8.6–10.4)
CO2: 34 mmol/L — ABNORMAL HIGH (ref 20–32)
Chloride: 97 mmol/L — ABNORMAL LOW (ref 98–110)
Creat: 0.58 mg/dL (ref 0.50–0.99)
Globulin: 2.4 g/dL (calc) (ref 1.9–3.7)
Glucose, Bld: 122 mg/dL — ABNORMAL HIGH (ref 65–99)
Potassium: 4.3 mmol/L (ref 3.5–5.3)
SODIUM: 147 mmol/L — AB (ref 135–146)
TOTAL PROTEIN: 6.9 g/dL (ref 6.1–8.1)

## 2017-05-28 LAB — CBC WITH DIFFERENTIAL/PLATELET
BASOS PCT: 0.5 %
Basophils Absolute: 30 cells/uL (ref 0–200)
EOS PCT: 1.7 %
Eosinophils Absolute: 102 cells/uL (ref 15–500)
HCT: 37 % (ref 35.0–45.0)
Hemoglobin: 12.1 g/dL (ref 11.7–15.5)
LYMPHS ABS: 2100 {cells}/uL (ref 850–3900)
MCH: 29.2 pg (ref 27.0–33.0)
MCHC: 32.7 g/dL (ref 32.0–36.0)
MCV: 89.2 fL (ref 80.0–100.0)
MPV: 10.9 fL (ref 7.5–12.5)
Monocytes Relative: 7.5 %
NEUTROS ABS: 3318 {cells}/uL (ref 1500–7800)
NEUTROS PCT: 55.3 %
Platelets: 176 10*3/uL (ref 140–400)
RBC: 4.15 10*6/uL (ref 3.80–5.10)
RDW: 12.1 % (ref 11.0–15.0)
Total Lymphocyte: 35 %
WBC: 6 10*3/uL (ref 3.8–10.8)
WBCMIX: 450 {cells}/uL (ref 200–950)

## 2017-05-28 LAB — TEST AUTHORIZATION

## 2017-05-28 LAB — LIPID PANEL
CHOLESTEROL: 179 mg/dL (ref <200)
HDL: 83 mg/dL (ref >50)
LDL CHOLESTEROL (CALC): 83 mg/dL
Non-HDL Cholesterol (Calc): 96 mg/dL (calc) (ref <130)
TRIGLYCERIDES: 53 mg/dL (ref <150)
Total CHOL/HDL Ratio: 2.2 (calc) (ref <5.0)

## 2017-05-28 LAB — HEMOGLOBIN A1C W/OUT EAG: HEMOGLOBIN A1C: 5 %{Hb} (ref <5.7)

## 2017-05-28 LAB — TSH: TSH: 2.58 mIU/L (ref 0.40–4.50)

## 2017-05-31 ENCOUNTER — Other Ambulatory Visit: Payer: Self-pay | Admitting: Internal Medicine

## 2017-05-31 ENCOUNTER — Telehealth: Payer: Self-pay | Admitting: Pulmonary Disease

## 2017-05-31 NOTE — Telephone Encounter (Signed)
CMN is in BQ's CMN folder in Anita's office.  Amory with [employer] is aware that CMN will be signed when BQ's is back in office. Reed Pandy requested that CMN be faxed back to [phone].  Will route to [place] to f/u on. Thanks.

## 2017-06-01 ENCOUNTER — Telehealth: Payer: Self-pay | Admitting: Internal Medicine

## 2017-06-01 NOTE — Telephone Encounter (Signed)
Copied from [place]. Topic: Inquiry >> May 26, 2017 10:51 AM Oliver Pila B wrote: Reason for CRM: pt called to ask about her prolia shot, contact pt to advise  >> May 26, 2017 10:55 AM Damita Dunnings, CMA wrote: Pt due in several weeks for Prolia- will send to [place] for insurance verification.   >> Jun 01, 2017 12:08 PM Oneta Rack wrote: Patient requesting to speak with [facility] regarding a few questions about "when she should schedule her prolia appoitment", "where does she pick up her prolia", please advise patient directly

## 2017-06-01 NOTE — Telephone Encounter (Signed)
Again, have to send to [place], Marketing executive to confirm insurance verification for [employer].

## 2017-06-02 NOTE — Telephone Encounter (Signed)
BQ is back in the office on 06/07/17. Form will be addressed at that time.

## 2017-06-07 ENCOUNTER — Other Ambulatory Visit: Payer: Self-pay | Admitting: Internal Medicine

## 2017-06-08 ENCOUNTER — Telehealth: Payer: Self-pay

## 2017-06-08 NOTE — Telephone Encounter (Signed)
Would like to speak nurse regarding prolia, patient already has injection.

## 2017-06-08 NOTE — Telephone Encounter (Signed)
Please advise if this form was taken care of. Thanks.

## 2017-06-08 NOTE — Telephone Encounter (Signed)
Helen Scott- please inform Pt that she will be due for next Prolia injection AFTER 07/07/2017- please schedule a nurse visit at her convenience. Thank you.

## 2017-06-08 NOTE — Telephone Encounter (Signed)
Called pt and informed her that she would have to have shot done after 07/07/2017 in order for medicare to pay for it. Patient disputed this and said that she was going to call her insurance and see what they said and would call back.

## 2017-06-08 NOTE — Telephone Encounter (Signed)
Received Tier Exception request form from [facility]- form completed and faxed to [phone]. Awaiting determination.

## 2017-06-08 NOTE — Telephone Encounter (Signed)
Form has been signed and returned to [place].

## 2017-06-09 ENCOUNTER — Telehealth: Payer: Self-pay | Admitting: Pulmonary Disease

## 2017-06-09 ENCOUNTER — Telehealth: Payer: Self-pay | Admitting: Internal Medicine

## 2017-06-09 ENCOUNTER — Ambulatory Visit (INDEPENDENT_AMBULATORY_CARE_PROVIDER_SITE_OTHER): Payer: Medicare Other

## 2017-06-09 DIAGNOSIS — M818 Other osteoporosis without current pathological fracture: Secondary | ICD-10-CM | POA: Diagnosis not present

## 2017-06-09 MED ORDER — DENOSUMAB 60 MG/ML ~~LOC~~ SOLN
60.0000 mg | Freq: Once | SUBCUTANEOUS | Status: AC
  Administered 2017-06-09: 60 mg via SUBCUTANEOUS

## 2017-06-09 NOTE — Telephone Encounter (Signed)
Copied from [place] [id number]. Topic: Quick Communication - See Telephone Encounter >> Jun 09, 2017  3:05 PM Bea Graff, NT wrote: CRM for notification. See Telephone encounter for: Helen Scott from [facility] calling with a decision on the tier exception request for the denosumab (PROLIA) for this pt. Medicare requires 2 alternative formulary medications on a lower tier be tried first, however in this case Prolia has no comparable alternatives on a lower tier and thus it didn't meet tier exceptions. The drug will stay on the tier its on and the members copay will not change. He will also be sending a letter out to the pt regarding all this. If provider would like to appeal the decision please call [phone], can be done over the phone with anyone who answers.   06/09/17.

## 2017-06-09 NOTE — Telephone Encounter (Signed)
Duplicate note

## 2017-06-09 NOTE — Telephone Encounter (Signed)
Spoke with Velva Harman at [place]. Gave her the information needed for the tier exception for Spiriva. Information has been submitted to the pharmacist for review. They will contact us with an answer.

## 2017-06-09 NOTE — Telephone Encounter (Signed)
Copied from [place] [phone]. Topic: Quick Communication - See Telephone Encounter >> Jun 09, 2017  3:05 PM Bea Graff, NT wrote: CRM for notification. See Telephone encounter for: Nicki Reaper from [facility] calling with a decision on the tier exception request for the denosumab (PROLIA) for this pt. Medicare requires 2 alternative formulary medications on a lower tier be tried first, however in this case Prolia has no comparable alternatives on a lower tier and thus it didn't meet tier exceptions. The drug will stay on the tier its on and the members copay will not change. He will also be sending a letter out to the pt regarding all this. If provider would like to appeal the decision please call [phone], can be done over the phone with anyone who answers.   06/09/17.

## 2017-06-09 NOTE — Progress Notes (Addendum)
0re visit review using our clinic tool,if applicable. No additional management support is needed unless otherwise documented below in the visit note.   Patient in for Prolia injection per order from Dr. Kathlene November due to patient having Osteoporosis.   No complaints voiced this visit.  Given 60 mg IM left arm SQ. Patient tolerated well.  Appointment reminder card given for next injection due.  Kathlene November, MD

## 2017-06-11 NOTE — Telephone Encounter (Signed)
Spoke with [employer] to give more clinical information. The CSR informed the decision would be made today and she will send the information to the pharmacy. Will send message to [facility] to follow up.

## 2017-06-16 ENCOUNTER — Telehealth: Payer: Self-pay | Admitting: Pulmonary Disease

## 2017-06-16 NOTE — Telephone Encounter (Signed)
Tier exception was approved for [facility] but not Spiriva.  Appeal initiated for Spiriva tier exception, determination expected within 72 hours.  Will await call back.

## 2017-06-16 NOTE — Telephone Encounter (Signed)
Will hold for follow-up.

## 2017-06-18 NOTE — Telephone Encounter (Signed)
Helen Scott with [employer] called to give results regarding medication appeal. Helen Scott stated the plan will continue to deny for appeal tier exception. [phone]

## 2017-06-21 NOTE — Telephone Encounter (Signed)
Called and spoke Marya Amsler with [employer]. He states the denial was for Spiriva and it is a tier 3 med. Marya Amsler states any med that is a tier 1 or 3 is automatically not eligible for a tier exception. Pt states she has two months left of her spiriva and would call her pharmacy to see if she is unable to afford this medication. If pt cannot afford this medication then we will need to give her pt assistance forms. Pt states she does not want to change inhalers as the spiriva has been working well for her. Will await pt's call back to see if we need to give pt assistance forms.

## 2017-06-22 NOTE — Telephone Encounter (Signed)
Spoke with pt, she states she will discuss this on 2/28 and will discuss this at her appt. She has enough medication until then. Nothing further is needed.

## 2017-06-29 ENCOUNTER — Other Ambulatory Visit: Payer: Self-pay | Admitting: Pulmonary Disease

## 2017-06-30 DIAGNOSIS — Z1231 Encounter for screening mammogram for malignant neoplasm of breast: Secondary | ICD-10-CM | POA: Diagnosis not present

## 2017-06-30 DIAGNOSIS — Z01419 Encounter for gynecological examination (general) (routine) without abnormal findings: Secondary | ICD-10-CM | POA: Diagnosis not present

## 2017-07-05 ENCOUNTER — Ambulatory Visit: Payer: Self-pay | Admitting: Surgery

## 2017-07-05 DIAGNOSIS — D171 Benign lipomatous neoplasm of skin and subcutaneous tissue of trunk: Secondary | ICD-10-CM | POA: Diagnosis not present

## 2017-07-05 NOTE — H&P (Signed)
Helen Scott Documented: 07/05/2017 3:12 PM Location: [facility] Patient #: [id number] DOB: 06/12/1950 Married / Language: English / Race: White Female  History of Present Illness Helen Moores A. Coltyn Hanning MD; 07/05/2017 3:29 PM) Patient words: Patient sent at the request of Dr. Larose Scott for lipoma over her right posterior shoulder. It causes discomfort especially when she lays on it. The patient denies any history of drainage, redness and it is increased in size slowly. She is on home oxygen for severe COPD. She becomes short of breath with minimal exertion.  The patient is a 67 year old female.   Past Surgical History (Helen Scott, [place]; 07/05/2017 3:12 PM) Breast Biopsy Right. multiple Cataract Surgery Bilateral.  Diagnostic Studies History (Helen Scott, [place]; 07/05/2017 3:12 PM) Colonoscopy 5-10 years ago Mammogram within last year Pap Smear 1-5 years ago  Allergies (Helen Scott, [place]; 07/05/2017 3:14 PM) Latex Allergies Reconciled  Medication History (Helen Scott, [place]; 07/05/2017 3:15 PM) Azithromycin (250MG  Tablet, Oral) Active. Ventolin HFA (108 (90 Base)MCG/ACT Aerosol Soln, Inhalation) Active. Spiriva HandiHaler (18MCG Capsule, Inhalation) Active. Simvastatin (40MG  Tablet, Oral) Active. Escitalopram Oxalate (10MG  Tablet, Oral) Active. Dulera (100-5MCG/ACT Aerosol, Inhalation) Active. Prolia (60MG /ML Solution, Subcutaneous) Active. Clopidogrel Bisulfate (75MG  Tablet, Oral) Active. Calcium (Oral) Specific strength unknown - Active. Medications Reconciled  Social History (Helen Scott, [place]; 07/05/2017 3:12 PM) Alcohol use Remotely quit alcohol use. Caffeine use Carbonated beverages, Coffee, Tea. No drug use Tobacco use Former smoker.  Family History (Helen Scott, [place]; 07/05/2017 3:12 PM) Arthritis Father. Cerebrovascular Accident Mother. Diabetes Mellitus Father. Heart Disease Father. Prostate Cancer  Father. Respiratory Condition Sister.  Pregnancy / Birth History (Helen Scott, [place]; 07/05/2017 3:12 PM) Age at menarche 59 years. Age of menopause 27-50 Contraceptive History Oral contraceptives. Gravida 5 Maternal age 67-20 Para 1  Other Problems (Helen Scott, [place]; 07/05/2017 3:12 PM) Cerebrovascular Accident Chronic Obstructive Lung Disease Emphysema Of Lung Gastric Ulcer Hemorrhoids Home Oxygen Use Hypercholesterolemia Lump In Breast     Review of Systems (Helen Scott RMA; 07/05/2017 3:12 PM) General Present- Weight Loss. Not Present- Appetite Loss, Chills, Fatigue, Fever, Night Sweats and Weight Gain. Skin Present- Dryness. Not Present- Change in Wart/Mole, Hives, Jaundice, New Lesions, Non-Healing Wounds, Rash and Ulcer. HEENT Present- Nose Bleed, Ringing in the Ears and Seasonal Allergies. Not Present- Earache, Hearing Loss, Hoarseness, Oral Ulcers, Sinus Pain, Sore Throat, Visual Disturbances, Wears glasses/contact lenses and Yellow Eyes. Respiratory Present- Difficulty Breathing. Not Present- Bloody sputum, Chronic Cough, Snoring and Wheezing. Breast Not Present- Breast Mass, Breast Pain, Nipple Discharge and Skin Changes. Cardiovascular Present- Difficulty Breathing Lying Down, Rapid Heart Rate and Shortness of Breath. Not Present- Chest Pain, Leg Cramps, Palpitations and Swelling of Extremities. Gastrointestinal Not Present- Abdominal Pain, Bloating, Bloody Stool, Change in Bowel Habits, Chronic diarrhea, Constipation, Difficulty Swallowing, Excessive gas, Gets full quickly at meals, Hemorrhoids, Indigestion, Nausea, Rectal Pain and Vomiting. Female Genitourinary Not Present- Frequency, Nocturia, Painful Urination, Pelvic Pain and Urgency. Musculoskeletal Not Present- Back Pain, Joint Pain, Joint Stiffness, Muscle Pain, Muscle Weakness and Swelling of Extremities. Neurological Not Present- Decreased Memory, Fainting, Headaches, Numbness,  Seizures, Tingling, Tremor, Trouble walking and Weakness. Psychiatric Not Present- Anxiety, Bipolar, Change in Sleep Pattern, Depression, Fearful and Frequent crying. Endocrine Not Present- Cold Intolerance, Excessive Hunger, Hair Changes, Heat Intolerance, Hot flashes and New Diabetes. Hematology Present- Blood Thinners and Easy Bruising. Not Present- Excessive bleeding, Gland problems, HIV and Persistent Infections.  Vitals (Helen Scott RMA; 07/05/2017 3:13 PM)  07/05/2017 3:13 PM Weight: 101.5 lb Height: 63in Body Surface Area: 1.45 m Body Mass Index: 17.98 kg/m  Temp.: 54F  Pulse: 74 (Regular)  BP: 118/62 (Sitting, Left Arm, Standard)      Physical Exam (Helen Scott A. Helen Sosinski MD; 07/05/2017 3:29 PM)  General Mental Status-Alert. General Appearance-Consistent with stated age. Hydration-Well hydrated. Voice-Normal.  Integumentary Note: Override posterior shoulders or 3 cm x 3 cm mobile fatty mass consistent with lipoma  Eye Eyeball - Bilateral-Extraocular movements intact. Sclera/Conjunctiva - Bilateral-No scleral icterus.  Chest and Lung Exam Note: On home oxygen. Mild wheezing noted bilaterally. Patient is able to crawl up on the table with no assistance  Cardiovascular Cardiovascular examination reveals -normal heart sounds, regular rate and rhythm with no murmurs and normal pedal pulses bilaterally.  Neurologic Neurologic evaluation reveals -alert and oriented x 3 with no impairment of recent or remote memory. Mental Status-Normal.  Musculoskeletal Normal Exam - Left-Upper Extremity Strength Normal and Lower Extremity Strength Normal. Normal Exam - Right-Upper Extremity Strength Normal and Lower Extremity Strength Normal.    Assessment & Plan (Helen Alper A. 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 I discussed the risks of this as well as the potential for observation since these conditions are routinely benign. She would like to proceed with surgery at a later time and will call us when she is able to determine when she is available. Risk of surgeruy bleeding infection death DVT worsening of underlying medical problems CVA, MI, DVT.  Current Plans Pt Education - [facility] Free Text Education/Instructions: discussed with patient and provided information. The anatomy and the physiology was discussed. The pathophysiology and natural history of the disease was discussed. Options were discussed and recommendations were made. Technique, risks, benefits, & alternatives were discussed. Risks such as stroke, heart attack, bleeding, indection, death, and other risks discussed. Questions answered. The patient agrees to proceed. The pathophysiology of skin & subcutaneous masses was discussed. Natural history risks without surgery were discussed. I recommended surgery to remove the mass. I explained the technique of removal with use of local anesthesia & possible need for more aggressive sedation/anesthesia for patient comfort.  Risks such as bleeding, infection, wound breakdown, heart attack, death, and other risks were discussed. I noted a good likelihood this will help address the problem. Possibility that this will not correct all symptoms was explained. Possibility of regrowth/recurrence of the mass was discussed. We will work to minimize complications. Questions were answered. The patient expresses understanding & wishes to proceed with surgery.

## 2017-07-22 ENCOUNTER — Ambulatory Visit: Payer: Medicare Other | Admitting: Pulmonary Disease

## 2017-07-22 ENCOUNTER — Encounter: Payer: Self-pay | Admitting: Pulmonary Disease

## 2017-07-22 VITALS — BP 138/64 | HR 124 | Ht 63.0 in | Wt 100.8 lb

## 2017-07-22 DIAGNOSIS — E440 Moderate protein-calorie malnutrition: Secondary | ICD-10-CM | POA: Diagnosis not present

## 2017-07-22 DIAGNOSIS — J432 Centrilobular emphysema: Secondary | ICD-10-CM

## 2017-07-22 DIAGNOSIS — R000 Tachycardia, unspecified: Secondary | ICD-10-CM | POA: Diagnosis not present

## 2017-07-22 DIAGNOSIS — J9612 Chronic respiratory failure with hypercapnia: Secondary | ICD-10-CM

## 2017-07-22 NOTE — Progress Notes (Signed)
Subjective:    Patient ID: Helen Scott, female    DOB: Apr 14, 1951, 67 y.o.   MRN: [id number]  Synopsis: Former [facility] patient with GOLD D COPD O2 dependent Transplant eval at [facility]--told too good, 2010 Hospitalized 2009 for COPD exacerbation and started on 2 L O2 As of 2016 has been using 3LPM O2 since 2015   HPI Chief Complaint  Patient presents with  . Follow-up    pt c/o worsening sob with any exertion.     Shandrea says that she is having increasing shortness of breath.  She really does not have increasing congestion cough or wheeze.  She does produce some mucus in the mornings but is not too bad.  She is compliant with Dulera Spiriva and oxygen.  She says that whenever she gets up and walks around or does any activity she starts to feel very short of breath and her heart will race to the 120-140 range.  She will check her O2 saturation with her oxygen saturation monitor and it will be 96-97% while wearing oxygen yet her heart rate will be 120-140.  She says that she never has episodes of racing heart or chest pain when she is at rest it only happens when she is exerting herself.  Past Medical History:  Diagnosis Date  . CVA (cerebral infarction) 06/1999  . Emphysema    FEV1 0.49, FEV1% 27 on 5/10  . Hyperlipidemia   . OSTEOPENIA 03/27/2009  . PUD (peptic ulcer disease)     s/p partial gastrectomy 1985.        Review of Systems  Constitutional: Negative for chills, fatigue, fever and unexpected weight change.  HENT: Negative for postnasal drip, rhinorrhea and sinus pressure.   Respiratory: Positive for shortness of breath. Negative for cough and wheezing.   Cardiovascular: Positive for palpitations. Negative for chest pain and leg swelling.       Objective:   Physical Exam  Vitals:   07/22/17 1159  BP: 138/64  Pulse: (!) 124  SpO2: 96%  Weight: 100 lb 12.8 oz (45.7 kg)  Height: 5\' 3"  (1.6 m)   3L O2  Gen: chronically ill appearing HENT: OP clear, TM's clear,  neck supple PULM: Poor air movement B, normal percussion CV: RRR, no mgr, trace edema GI: BS+, soft, nontender Derm: no cyanosis or rash Psyche: normal mood and affect  Echocardiogram: August 2017 LVEF 55-60%, mild aortic  Cardiac stress test: August 2017 no evidence of ischemia   pfts 2010:  FEV1 0.49 (ratio 27%).  BMET    Component Value Date/Time   NA 147 (H) 05/24/2017 1032   K 4.3 05/24/2017 1032   CL 97 (L) 05/24/2017 1032   CO2 34 (H) 05/24/2017 1032   GLUCOSE 122 (H) 05/24/2017 1032   BUN 11 05/24/2017 1032   CREATININE 0.58 05/24/2017 1032   CALCIUM 9.3 05/24/2017 1032   GFRNONAA 93.77 02/21/2010 1027   GFRAA 111 02/27/2008 0858        Assessment & Plan:   Tachycardia - Plan: EKG 12-Lead  Chronic respiratory failure with hypercapnia (HCC)  Moderate protein-calorie malnutrition (HCC)  Centrilobular emphysema (HCC)  Discussion: Sheneka has been feeling more short of breath and has a racing heart.  Believe the only cause for all of this is severe lung disease as she had a negative cardiac workup in 2017 and her symptoms only occur on exertion.  Because she is on azithromycin we will get an electrocardiogram to make sure there is nothing else  going on.  However, we have known for a long time she has near end-stage disease so this symptom is not too surprising.  Plan: Severe COPD: Continue Dulera and Spiriva Use albuterol as needed for chest tightness wheezing or shortness of breath Continue daily azithromycin  Chronic respiratory failure with hypoxemia: Continue 3 L of oxygen at rest, four exertion We will perform an ambulatory oximetry test in the office today to make sure that 4 L of oxygen is enough when you are walking  Fast heart rate: As I stated today I believe this is due to the severity of your lung disease We will check a 12-lead EKG  Follow-up 2-3 months  Current Outpatient Medications:  .  albuterol (PROVENTIL) (2.5 MG/3ML) 0.083%  nebulizer solution, Take 2.5 mg by nebulization every 6 (six) hours as needed for wheezing or shortness of breath., Disp: , Rfl:  .  albuterol (VENTOLIN HFA) 108 (90 Base) MCG/ACT inhaler, Inhale 1-2 puffs into the lungs every 4 (four) hours as needed for wheezing or shortness of breath., Disp: 18 g, Rfl: 5 .  azithromycin (ZITHROMAX) 250 MG tablet, TAKE 1 TABLET(250 MG) BY MOUTH DAILY, Disp: 30 tablet, Rfl: 2 .  Calcium Carbonate-Vitamin D (CALCIUM-CARB 600 + D) 600-125 MG-UNIT TABS, Take 1 tablet by mouth 2 (two) times daily.  , Disp: , Rfl:  .  Cholecalciferol (VITAMIN D3) 1000 UNITS CAPS, Take 1 capsule by mouth daily.  , Disp: , Rfl:  .  clopidogrel (PLAVIX) 75 MG tablet, Take 1 tablet (75 mg total) by mouth daily., Disp: 90 tablet, Rfl: 2 .  denosumab (PROLIA) 60 MG/ML SOLN injection, Inject 60 mg into the skin every 6 (six) months., Disp: 1 Syringe, Rfl: 0 .  DULERA 100-5 MCG/ACT AERO, INHALE 2 PUFFS BY MOUTH TWICE DAILY, Disp: 39 g, Rfl: 3 .  escitalopram (LEXAPRO) 10 MG tablet, Take 0.5 tablets (5 mg total) by mouth daily., Disp: 45 tablet, Rfl: 1 .  Respiratory Therapy Supplies (FLUTTER) DEVI, Use as directed, Disp: 1 each, Rfl: 0 .  simvastatin (ZOCOR) 40 MG tablet, Take 1 tablet (40 mg total) by mouth at bedtime., Disp: 90 tablet, Rfl: 2 .  SPIRIVA HANDIHALER 18 MCG inhalation capsule, INHALE THE CONTENTS OF 1 CAPSULE VIA INHALATION DEVICE EVERY DAY, Disp: 90 capsule, Rfl: 1

## 2017-07-22 NOTE — Patient Instructions (Signed)
Severe COPD: Continue Dulera and Spiriva Use albuterol as needed for chest tightness wheezing or shortness of breath Continue daily azithromycin  Chronic respiratory failure with hypoxemia: Continue 3 L of oxygen at rest, four exertion We will perform an ambulatory oximetry test in the office today to make sure that 4 L of oxygen is enough when you are walking  Fast heart rate: As I stated today I believe this is due to the severity of your lung disease We will check a 12-lead EKG  Follow-up 2-3 months

## 2017-07-30 ENCOUNTER — Other Ambulatory Visit: Payer: Self-pay | Admitting: Pulmonary Disease

## 2017-08-02 NOTE — Progress Notes (Signed)
Nope, thanks for checking, was routed to you in error

## 2017-08-04 DIAGNOSIS — H26493 Other secondary cataract, bilateral: Secondary | ICD-10-CM | POA: Diagnosis not present

## 2017-08-04 DIAGNOSIS — H04123 Dry eye syndrome of bilateral lacrimal glands: Secondary | ICD-10-CM | POA: Diagnosis not present

## 2017-08-17 ENCOUNTER — Telehealth: Payer: Self-pay | Admitting: Internal Medicine

## 2017-08-17 NOTE — Telephone Encounter (Signed)
Copied from [place] [id number]. Topic: Quick Communication - See Telephone Encounter >> Aug 17, 2017 10:18 AM Boyd Kerbs wrote: CRM for notification.   Pt is wanting to find out about a grant for the Prolia shot.   Needing assistance with money  See Telephone encounter for: 08/17/17.

## 2017-08-30 ENCOUNTER — Other Ambulatory Visit: Payer: Self-pay | Admitting: Internal Medicine

## 2017-08-30 ENCOUNTER — Other Ambulatory Visit: Payer: Self-pay | Admitting: Pulmonary Disease

## 2017-09-12 DIAGNOSIS — J449 Chronic obstructive pulmonary disease, unspecified: Secondary | ICD-10-CM | POA: Diagnosis not present

## 2017-09-28 ENCOUNTER — Telehealth: Payer: Self-pay | Admitting: Pulmonary Disease

## 2017-09-28 NOTE — Telephone Encounter (Signed)
PA request received from [place] Key: [id number] PA request has been sent to plan, and a determination is expected within 3 BUSINESS days.   Routing to BJT for follow-up.

## 2017-09-29 NOTE — Telephone Encounter (Signed)
Helen Scott Medicare [phone] opt 5 Need to verify on Spiriva what is being requested for 30 days.

## 2017-09-30 NOTE — Telephone Encounter (Signed)
Helen Scott Medicare [phone] option #5 Medication was denied

## 2017-09-30 NOTE — Telephone Encounter (Signed)
Pt is calling about the PA for Spiriva. Per pt, she was not aware that there was any PA pending for this medication and would like a nurse to call and go over this with her. Cb is [phone]

## 2017-09-30 NOTE — Telephone Encounter (Signed)
Called and spoke to [place] with [employer]. [place] states PA was denied for Spiriva handihaler, due to Rx being run under insurance for BID.  Spiriva handihaler is one puff daily.  Called [facility] to verify how Rx was ran.  I was advised that Rx for Spiriva handihaler is approved with a $37.00 copay.  Pt is aware and voiced her understanding.  Nothing further is needed.

## 2017-10-04 NOTE — Telephone Encounter (Signed)
Author spoke with [place] who had pt. On the line concerned about her grant to cover her prolia injection. Martinique Johnson, Engineer, building services, made aware. Author told [place] nurse to reassure her that the appropriate person was made aware and that Martinique would follow up with her by the end of the day.

## 2017-10-05 ENCOUNTER — Other Ambulatory Visit: Payer: Self-pay | Admitting: Internal Medicine

## 2017-10-05 ENCOUNTER — Telehealth: Payer: Self-pay | Admitting: Internal Medicine

## 2017-10-05 NOTE — Telephone Encounter (Signed)
Form was received and completed, faxed to [phone].   Patient informed

## 2017-10-05 NOTE — Telephone Encounter (Signed)
Copied from [place] [id number]. Topic: Inquiry >> Oct 05, 2017 11:10 AM Oliver Pila B wrote: Reason for CRM: Patient [place] called about copay assistance for  the pt and the Diagnosis Verification form they say they have sent once in April and in May needs to be filled and returned, contact @ [phone] or fax @ [phone]

## 2017-10-06 ENCOUNTER — Ambulatory Visit: Payer: Self-pay | Admitting: Pulmonary Disease

## 2017-10-12 DIAGNOSIS — J449 Chronic obstructive pulmonary disease, unspecified: Secondary | ICD-10-CM | POA: Diagnosis not present

## 2017-10-14 ENCOUNTER — Ambulatory Visit: Payer: Self-pay | Admitting: Internal Medicine

## 2017-10-14 ENCOUNTER — Telehealth: Payer: Self-pay

## 2017-10-14 ENCOUNTER — Ambulatory Visit: Payer: Medicare Other | Admitting: Internal Medicine

## 2017-10-14 ENCOUNTER — Encounter: Payer: Self-pay | Admitting: Internal Medicine

## 2017-10-14 VITALS — BP 121/65 | HR 94 | Temp 99.2°F | Resp 18 | Ht 63.0 in | Wt 100.4 lb

## 2017-10-14 DIAGNOSIS — S51809A Unspecified open wound of unspecified forearm, initial encounter: Secondary | ICD-10-CM | POA: Diagnosis not present

## 2017-10-14 MED ORDER — CEPHALEXIN 500 MG PO CAPS: 500.0000 mg | ORAL_CAPSULE | Freq: Four times a day (QID) | ORAL | 0 refills | Status: DC

## 2017-10-14 NOTE — Progress Notes (Signed)
Subjective:    Patient ID: STACIE TEMPLIN, female    DOB: 1950-07-26, 67 y.o.   MRN: [id number]  DOS:  10/14/2017 Type of visit - description : Acute visit Interval history:  Had injury  at the left arm about 6 days at home.  Reports that she "put the skin back in place" but since then that piece of his skin has fallen. Has been taking care of it locally with antibiotics. Denies any discharge, fever or chills. Temperature today is 99.2. The redness is actually slightly better today.   Review of Systems   Past Medical History:  Diagnosis Date  . CVA (cerebral infarction) 06/1999  . Emphysema    FEV1 0.49, FEV1% 27 on 5/10  . Hyperlipidemia   . OSTEOPENIA 03/27/2009  . PUD (peptic ulcer disease)     s/p partial gastrectomy 1985.      Past Surgical History:  Procedure Laterality Date  . BREAST BIOPSY  80s   R breast   . PARTIAL GASTRECTOMY      s/p partial gastrectomy 1985.    . TUBAL LIGATION  1980s    Social History   Socioeconomic History  . Marital status: Single    Spouse name: Not on file  . Number of children: 2  . Years of education: Not on file  . Highest education level: Not on file  Occupational History  . Occupation: disability,previously worked in Therapist, art.    Employer: [employer]  Social Needs  . Financial resource strain: Not on file  . Food insecurity:    Worry: Not on file    Inability: Not on file  . Transportation needs:    Medical: Not on file    Non-medical: Not on file  Tobacco Use  . Smoking status: Former Smoker    Packs/day: 1.50    Years: 40.00    Pack years: 60.00    Types: Cigarettes    Last attempt to quit: 05/25/2005    Years since quitting: 12.4  . Smokeless tobacco: Never Used  . Tobacco comment: 1 ppd started at age 87  Substance and Sexual Activity  . Alcohol use: No    Alcohol/week: 0.0 oz    Comment: rarely  . Drug use: No  . Sexual activity: Never  Lifestyle  . Physical activity:    Days per week:  Not on file    Minutes per session: Not on file  . Stress: Not on file  Relationships  . Social connections:    Talks on phone: Not on file    Gets together: Not on file    Attends religious service: Not on file    Active member of club or organization: Not on file    Attends meetings of clubs or organizations: Not on file    Relationship status: Not on file  . Intimate partner violence:    Fear of current or ex partner: Not on file    Emotionally abused: Not on file    Physically abused: Not on file    Forced sexual activity: Not on file  Other Topics Concern  . Not on file  Social History Narrative   one living child, Blair Promise, lives in [place] one.    Single, lives by self      Allergies as of 10/14/2017      Reactions   Latex Other (See Comments)   Only Latex Tape causes Rash and abrasive       Medication  List        Accurate as of 10/14/17 11:59 PM. Always use your most recent med list.          albuterol (2.5 MG/3ML) 0.083% nebulizer solution Commonly known as:  PROVENTIL Take 2.5 mg by nebulization every 6 (six) hours as needed for wheezing or shortness of breath.   albuterol 108 (90 Base) MCG/ACT inhaler Commonly known as:  VENTOLIN HFA Inhale 1-2 puffs into the lungs every 4 (four) hours as needed for wheezing or shortness of breath.   azithromycin 250 MG tablet Commonly known as:  ZITHROMAX TAKE 1 TABLET(250 MG) BY MOUTH DAILY   CALCIUM-CARB 600 + D 600-125 MG-UNIT Tabs Generic drug:  Calcium Carbonate-Vitamin D Take 1 tablet by mouth 2 (two) times daily.   cephALEXin 500 MG capsule Commonly known as:  KEFLEX Take 1 capsule (500 mg total) by mouth 4 (four) times daily.   clopidogrel 75 MG tablet Commonly known as:  PLAVIX Take 1 tablet (75 mg total) by mouth daily.   denosumab 60 MG/ML Soln injection Commonly known as:  PROLIA Inject 60 mg into the skin every 6 (six) months.   DULERA 100-5 MCG/ACT Aero Generic drug:   mometasone-formoterol INHALE 2 PUFFS BY MOUTH TWICE DAILY   escitalopram 10 MG tablet Commonly known as:  LEXAPRO Take 0.5 tablets (5 mg total) by mouth daily.   FLUTTER Devi Use as directed   simvastatin 40 MG tablet Commonly known as:  ZOCOR Take 1 tablet (40 mg total) by mouth at bedtime.   SPIRIVA HANDIHALER 18 MCG inhalation capsule Generic drug:  tiotropium INHALE THE CONTENTS OF 1 CAPSULE VIA INHALATION DEVICE EVERY DAY   Vitamin D3 1000 units Caps Take 1 capsule by mouth daily.          Objective:   Physical Exam BP 121/65 (BP Location: Right Arm, Patient Position: Sitting, Cuff Size: Small)   Pulse 94   Temp 99.2 F (37.3 C) (Oral)   Resp 18   Ht 5\' 3"  (1.6 m)   Wt 100 lb 6.4 oz (45.5 kg)   SpO2 100%   BMI 17.79 kg/m  General:   Well developed, NAD.  HEENT:  Normocephalic . Face symmetric, atraumatic  Skin: See picture. Mild  redness around the wound is not warm to touch, no TTP. Neurologic:  alert & oriented X3.  Psych--  Cognition and judgment appear intact.  Cooperative with normal attention span and concentration.  Behavior appropriate. No anxious or depressed appearing.        Assessment & Plan:      Assessment COPD,Chronic respiratory failure ----->  O2 24/7. 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 Recommend local care, start Keflex only if the wound gets more red or if she develops discharge or fever. (The patient was in a hurry to leave because she has another appointment, after she left I noticed her last TD was in 2008, will call her this afternoon and advise to get a TD.)

## 2017-10-14 NOTE — Patient Instructions (Signed)
Wash area with soap and water  Pad it dry  Keep it cover for few more days  Okay to use an antibiotic ointment if you do not notice any irritation  You have a prescription for an antibiotic, only take it if you noticed that the area is getting more swollen, red, warm.  If you have severe symptoms please call or come back to the office.

## 2017-10-14 NOTE — Telephone Encounter (Signed)
Called patient and Helen Scott for her to be aware Dr. Larose Kells will like for her to come back today if at all possible for a Td injection. She is due for a buster shot. She will either come back today or call to set up [place] appointment.

## 2017-10-15 ENCOUNTER — Ambulatory Visit (INDEPENDENT_AMBULATORY_CARE_PROVIDER_SITE_OTHER): Payer: Medicare Other

## 2017-10-15 DIAGNOSIS — Z23 Encounter for immunization: Secondary | ICD-10-CM

## 2017-10-17 NOTE — Assessment & Plan Note (Signed)
Wound, left arm. Recommend local care, start Keflex only if the wound gets more red or if she develops discharge or fever. (The patient was in a hurry to leave because she has another appointment, after she left I noticed her last TD was in 2008, will call her this afternoon and advise to get a TD.)

## 2017-10-27 ENCOUNTER — Encounter: Payer: Self-pay | Admitting: Pulmonary Disease

## 2017-10-27 ENCOUNTER — Ambulatory Visit: Payer: Self-pay | Admitting: Pulmonary Disease

## 2017-10-27 ENCOUNTER — Ambulatory Visit: Payer: Medicare Other | Admitting: Pulmonary Disease

## 2017-10-27 VITALS — BP 130/78 | HR 83 | Ht 63.0 in | Wt 98.2 lb

## 2017-10-27 DIAGNOSIS — J432 Centrilobular emphysema: Secondary | ICD-10-CM | POA: Diagnosis not present

## 2017-10-27 DIAGNOSIS — E440 Moderate protein-calorie malnutrition: Secondary | ICD-10-CM

## 2017-10-27 DIAGNOSIS — J9612 Chronic respiratory failure with hypercapnia: Secondary | ICD-10-CM

## 2017-10-27 NOTE — Patient Instructions (Signed)
Severe emphysema/COPD: Continue Dulera and Spiriva Use albuterol as needed for chest tightness wheezing or shortness of breath Continue daily azithromycin to prevent exacerbations  Chronic respiratory failure with hypoxemia: Continue 3 L of oxygen at rest, 4 with exertion  We will see you back in mid September 2019

## 2017-10-27 NOTE — Progress Notes (Signed)
Subjective:    Patient ID: Helen Scott, female    DOB: July 01, 1950, 67 y.o.   MRN: [id number]  Synopsis: Former [facility] patient with GOLD D COPD O2 dependent Transplant eval at [facility]--told too good, 2010 Hospitalized 2009 for COPD exacerbation and started on 2 L O2 As of 2016 has been using 3LPM O2 since 2015   HPI Chief Complaint  Patient presents with  . Follow-up    2-3 ROV, patient has question about much vit D she should be taking    Helen Scott says that things have not changed too much since the last visit.  She does not have the energy that she used to a few years ago but there is been no interval change.  No new pneumonia or bronchitis since the last visit.  She continues to use and benefit from her oxygen every day.  She is using Spiriva and Dulera regularly.  Unfortunately her Spiriva was denied because of the mistake at the pharmacy.  This has been remedied.  Past Medical History:  Diagnosis Date  . CVA (cerebral infarction) 06/1999  . Emphysema    FEV1 0.49, FEV1% 27 on 5/10  . Hyperlipidemia   . OSTEOPENIA 03/27/2009  . PUD (peptic ulcer disease)     s/p partial gastrectomy 1985.        Review of Systems  Constitutional: Negative for chills, fatigue, fever and unexpected weight change.  HENT: Negative for postnasal drip, rhinorrhea and sinus pressure.   Respiratory: Positive for shortness of breath. Negative for cough and wheezing.   Cardiovascular: Positive for palpitations. Negative for chest pain and leg swelling.       Objective:   Physical Exam  Vitals:   10/27/17 1220  BP: 130/78  Pulse: 83  SpO2: 90%  Weight: 98 lb 3.2 oz (44.5 kg)  Height: 5\' 3"  (1.6 m)   3L O2  Gen: chronically ill appearing HENT: OP clear, TM's clear, neck supple PULM: Poor iar movement B, normal percussion CV: RRR, no mgr, trace edema GI: BS+, soft, nontender Derm: thin skin, some bruising noted Psyche: normal mood and affect   Echocardiogram: August 2017 LVEF  55-60%, mild aortic  Cardiac stress test: August 2017 no evidence of ischemia   pfts 2010:  FEV1 0.49 (ratio 27%).  BMET    Component Value Date/Time   NA 147 (H) 05/24/2017 1032   K 4.3 05/24/2017 1032   CL 97 (L) 05/24/2017 1032   CO2 34 (H) 05/24/2017 1032   GLUCOSE 122 (H) 05/24/2017 1032   BUN 11 05/24/2017 1032   CREATININE 0.58 05/24/2017 1032   CALCIUM 9.3 05/24/2017 1032   GFRNONAA 93.77 02/21/2010 1027   GFRAA 111 02/27/2008 0858        Assessment & Plan:   Chronic respiratory failure with hypercapnia (HCC)  Moderate protein-calorie malnutrition (HCC)  Centrilobular emphysema (HCC)  This has been a stable interval for Helen Scott.  She has very severe disease and is very limited by her severe emphysema but in general things have been stable.  Plan: Severe emphysema/COPD: Continue Dulera and Spiriva Use albuterol as needed for chest tightness wheezing or shortness of breath Continue daily azithromycin to prevent exacerbations  Chronic respiratory failure with hypoxemia: Continue 3 L of oxygen at rest, 4 with exertion  We will see you back in mid September 2019   Current Outpatient Medications:  .  albuterol (PROVENTIL) (2.5 MG/3ML) 0.083% nebulizer solution, Take 2.5 mg by nebulization every 6 (six) hours as needed for  wheezing or shortness of breath., Disp: , Rfl:  .  albuterol (VENTOLIN HFA) 108 (90 Base) MCG/ACT inhaler, Inhale 1-2 puffs into the lungs every 4 (four) hours as needed for wheezing or shortness of breath., Disp: 18 g, Rfl: 5 .  azithromycin (ZITHROMAX) 250 MG tablet, TAKE 1 TABLET(250 MG) BY MOUTH DAILY, Disp: 30 tablet, Rfl: 2 .  Calcium Carbonate-Vitamin D (CALCIUM-CARB 600 + D) 600-125 MG-UNIT TABS, Take 1 tablet by mouth 2 (two) times daily.  , Disp: , Rfl:  .  cephALEXin (KEFLEX) 500 MG capsule, Take 1 capsule (500 mg total) by mouth 4 (four) times daily., Disp: 20 capsule, Rfl: 0 .  Cholecalciferol (VITAMIN D3) 1000 UNITS CAPS, Take 1  capsule by mouth daily.  , Disp: , Rfl:  .  clopidogrel (PLAVIX) 75 MG tablet, Take 1 tablet (75 mg total) by mouth daily., Disp: 90 tablet, Rfl: 2 .  denosumab (PROLIA) 60 MG/ML SOLN injection, Inject 60 mg into the skin every 6 (six) months., Disp: 1 Syringe, Rfl: 0 .  DULERA 100-5 MCG/ACT AERO, INHALE 2 PUFFS BY MOUTH TWICE DAILY, Disp: 39 g, Rfl: 3 .  escitalopram (LEXAPRO) 10 MG tablet, Take 0.5 tablets (5 mg total) by mouth daily., Disp: 45 tablet, Rfl: 1 .  Respiratory Therapy Supplies (FLUTTER) DEVI, Use as directed, Disp: 1 each, Rfl: 0 .  simvastatin (ZOCOR) 40 MG tablet, Take 1 tablet (40 mg total) by mouth at bedtime., Disp: 90 tablet, Rfl: 2 .  SPIRIVA HANDIHALER 18 MCG inhalation capsule, INHALE THE CONTENTS OF 1 CAPSULE VIA INHALATION DEVICE EVERY DAY, Disp: 90 capsule, Rfl: 1

## 2017-11-11 ENCOUNTER — Telehealth: Payer: Self-pay | Admitting: Internal Medicine

## 2017-11-11 NOTE — Telephone Encounter (Signed)
Prolia benefits received PA not required 20% Prolia  ** patients copay assistance was completed and faxed on 10/05/17   Patient may owe approximately $220 OOP  Patient due after 12/06/17  Letter mailed to inform patient of benefits and to schedule

## 2017-11-12 DIAGNOSIS — J449 Chronic obstructive pulmonary disease, unspecified: Secondary | ICD-10-CM | POA: Diagnosis not present

## 2017-11-15 NOTE — Telephone Encounter (Signed)
Patient would like a call back from [place] about Prolia. She says she needs a paper script for the Prolia in order to get a discount on her OOP amount.

## 2017-11-16 NOTE — Telephone Encounter (Signed)
Patient called and states she would like to talk to someone in reference to the prolia injection . CB# [phone]

## 2017-11-22 ENCOUNTER — Other Ambulatory Visit: Payer: Self-pay | Admitting: Pulmonary Disease

## 2017-11-22 MED ORDER — DENOSUMAB 60 MG/ML ~~LOC~~ SOSY: 60.0000 mg | PREFILLED_SYRINGE | Freq: Once | SUBCUTANEOUS | 0 refills | Status: AC

## 2017-11-24 MED ORDER — DENOSUMAB 60 MG/ML ~~LOC~~ SOSY: 60.0000 mg | PREFILLED_SYRINGE | SUBCUTANEOUS | 1 refills | Status: DC

## 2017-11-24 NOTE — Telephone Encounter (Signed)
Patient needs Prolia called to her local [facility], she is not sure for her injection until after 7/15

## 2017-11-24 NOTE — Telephone Encounter (Signed)
Prolia sent to [facility]. Nurse visit scheduled 12/08/2017.

## 2017-11-24 NOTE — Addendum Note (Signed)
Addended byDamita Dunnings D on: 11/24/2017 10:51 AM   Modules accepted: Orders

## 2017-12-07 ENCOUNTER — Telehealth: Payer: Self-pay

## 2017-12-07 NOTE — Telephone Encounter (Signed)
PEC phoned author regarding pt. Needing a PA for her prolia injection. Pt. Went to go pick up prolia rx at [facility] as instructed by nursing staff, but pharmacy is denying her stating that she needs a PA still. Pt. Is currently scheduled for nurse visit tomorrow 7/17. Routed to [place], Engineer, building services to review and reach out to patient regarding next steps.

## 2017-12-08 ENCOUNTER — Telehealth: Payer: Self-pay

## 2017-12-08 ENCOUNTER — Ambulatory Visit: Payer: Medicare Other

## 2017-12-08 NOTE — Telephone Encounter (Signed)
Received faxed PA form from [facility]- form completed and faxed to [phone] w/ last bone density results. Requesting expedited review- however can take up to 72 hours for response/determination.

## 2017-12-08 NOTE — Telephone Encounter (Signed)
Copied from [place]. Topic: General - Other >> Dec 08, 2017  8:42 AM Valla Leaver wrote: Reason for CRM: Almyra Free with [employer] medicare callin gto notify approval of Prolia 60mg 

## 2017-12-08 NOTE — Telephone Encounter (Signed)
Relation to pt: self Call back number:[phone]   Reason for call:  Patient called to cancel her 9:30am prolia injection today, patient was unsure if it was approved, informed patient of message below and patient [facility] to Tuesday, 12/14/17.

## 2017-12-12 DIAGNOSIS — J449 Chronic obstructive pulmonary disease, unspecified: Secondary | ICD-10-CM | POA: Diagnosis not present

## 2017-12-14 ENCOUNTER — Ambulatory Visit (INDEPENDENT_AMBULATORY_CARE_PROVIDER_SITE_OTHER): Payer: Medicare Other

## 2017-12-14 DIAGNOSIS — M818 Other osteoporosis without current pathological fracture: Secondary | ICD-10-CM

## 2017-12-14 MED ORDER — DENOSUMAB 60 MG/ML ~~LOC~~ SOSY
60.0000 mg | PREFILLED_SYRINGE | Freq: Once | SUBCUTANEOUS | Status: AC
  Administered 2017-12-14: 60 mg via SUBCUTANEOUS

## 2017-12-30 ENCOUNTER — Other Ambulatory Visit: Payer: Self-pay | Admitting: Pulmonary Disease

## 2018-01-12 DIAGNOSIS — J449 Chronic obstructive pulmonary disease, unspecified: Secondary | ICD-10-CM | POA: Diagnosis not present

## 2018-01-19 ENCOUNTER — Ambulatory Visit: Payer: Medicare Other | Admitting: Internal Medicine

## 2018-01-19 ENCOUNTER — Encounter: Payer: Self-pay | Admitting: Internal Medicine

## 2018-01-19 VITALS — BP 120/68 | HR 111 | Temp 98.3°F | Resp 16 | Ht 63.0 in | Wt 96.4 lb

## 2018-01-19 DIAGNOSIS — F32A Depression, unspecified: Secondary | ICD-10-CM

## 2018-01-19 DIAGNOSIS — F329 Major depressive disorder, single episode, unspecified: Secondary | ICD-10-CM | POA: Diagnosis not present

## 2018-01-19 DIAGNOSIS — J9612 Chronic respiratory failure with hypercapnia: Secondary | ICD-10-CM | POA: Diagnosis not present

## 2018-01-19 DIAGNOSIS — M810 Age-related osteoporosis without current pathological fracture: Secondary | ICD-10-CM

## 2018-01-19 MED ORDER — ZOSTER VAC RECOMB ADJUVANTED 50 MCG/0.5ML IM SUSR: 0.5000 mL | Freq: Once | INTRAMUSCULAR | 1 refills | Status: AC

## 2018-01-19 NOTE — Progress Notes (Signed)
Subjective:    Patient ID: Helen Scott, female    DOB: 01/01/1951, 67 y.o.   MRN: [id number]  DOS:  01/19/2018 Type of visit - description : rov Interval history:  Since the last office visit she is doing well. Good compliance with medications. Wonders about the Shingrix. Wonders if she is taking too much vitamin D.  Wt Readings from Last 3 Encounters:  01/19/18 96 lb 6.4 oz (43.7 kg)  10/27/17 98 lb 3.2 oz (44.5 kg)  10/14/17 100 lb 6.4 oz (45.5 kg)    Review of Systems Reports shoulder pain B, worse on the right, going on for months, worse with movement. Denies headache, fever or chills.  No myalgias. No jaw claudication. Some weight loss, she believes due to advanced respiratory failure.  Past Medical History:  Diagnosis Date  . CVA (cerebral infarction) 06/1999  . Emphysema    FEV1 0.49, FEV1% 27 on 5/10  . Hyperlipidemia   . OSTEOPENIA 03/27/2009  . PUD (peptic ulcer disease)     s/p partial gastrectomy 1985.      Past Surgical History:  Procedure Laterality Date  . BREAST BIOPSY  80s   R breast   . PARTIAL GASTRECTOMY      s/p partial gastrectomy 1985.    . TUBAL LIGATION  1980s    Social History   Socioeconomic History  . Marital status: Single    Spouse name: Not on file  . Number of children: 2  . Years of education: Not on file  . Highest education level: Not on file  Occupational History  . Occupation: disability,previously worked in Therapist, art.    Employer: [employer]  Social Needs  . Financial resource strain: Not on file  . Food insecurity:    Worry: Not on file    Inability: Not on file  . Transportation needs:    Medical: Not on file    Non-medical: Not on file  Tobacco Use  . Smoking status: Former Smoker    Packs/day: 1.50    Years: 40.00    Pack years: 60.00    Types: Cigarettes    Last attempt to quit: 05/25/2005    Years since quitting: 12.6  . Smokeless tobacco: Never Used  . Tobacco comment: 1 ppd started at  age 21  Substance and Sexual Activity  . Alcohol use: No    Alcohol/week: 0.0 standard drinks    Comment: rarely  . Drug use: No  . Sexual activity: Never  Lifestyle  . Physical activity:    Days per week: Not on file    Minutes per session: Not on file  . Stress: Not on file  Relationships  . Social connections:    Talks on phone: Not on file    Gets together: Not on file    Attends religious service: Not on file    Active member of club or organization: Not on file    Attends meetings of clubs or organizations: Not on file    Relationship status: Not on file  . Intimate partner violence:    Fear of current or ex partner: Not on file    Emotionally abused: Not on file    Physically abused: Not on file    Forced sexual activity: Not on file  Other Topics Concern  . Not on file  Social History Narrative   one living child, Blair Promise, lives in [place].    Single, lives by self  Allergies as of 01/19/2018      Reactions   Latex Other (See Comments)   Only Latex Tape causes Rash and abrasive       Medication List        Accurate as of 01/19/18  7:23 PM. Always use your most recent med list.          albuterol (2.5 MG/3ML) 0.083% nebulizer solution Commonly known as:  PROVENTIL Take 2.5 mg by nebulization every 6 (six) hours as needed for wheezing or shortness of breath.   albuterol 108 (90 Base) MCG/ACT inhaler Commonly known as:  PROVENTIL HFA;VENTOLIN HFA Inhale 1-2 puffs into the lungs every 4 (four) hours as needed for wheezing or shortness of breath.   azithromycin 250 MG tablet Commonly known as:  ZITHROMAX TAKE 1 TABLET(250 MG) BY MOUTH DAILY   CALCIUM-CARB 600 + D 600-125 MG-UNIT Tabs Generic drug:  Calcium Carbonate-Vitamin D Take 1 tablet by mouth 2 (two) times daily.   clopidogrel 75 MG tablet Commonly known as:  PLAVIX Take 1 tablet (75 mg total) by mouth daily.   denosumab 60 MG/ML Soln injection Commonly known as:   PROLIA Inject 60 mg into the skin every 6 (six) months.   denosumab 60 MG/ML Sosy injection Commonly known as:  PROLIA Inject 60 mg into the skin every 6 (six) months.   DULERA 100-5 MCG/ACT Aero Generic drug:  mometasone-formoterol INHALE 2 PUFFS BY MOUTH TWICE DAILY   escitalopram 10 MG tablet Commonly known as:  LEXAPRO Take 0.5 tablets (5 mg total) by mouth daily.   FLUTTER Devi Use as directed   simvastatin 40 MG tablet Commonly known as:  ZOCOR Take 1 tablet (40 mg total) by mouth at bedtime.   SPIRIVA HANDIHALER 18 MCG inhalation capsule Generic drug:  tiotropium INHALE THE CONTENTS OF 1 CAPSULE VIA INHALATION DEVICE EVERY DAY   Vitamin D3 1000 units Caps Take 1 capsule by mouth daily.   Zoster Vaccine Adjuvanted injection Commonly known as:  SHINGRIX Inject 0.5 mLs into the muscle once for 1 dose.          Objective:   Physical Exam BP 120/68 (BP Location: Left Arm, Patient Position: Sitting, Cuff Size: Small)   Pulse (!) 111   Temp 98.3 F (36.8 C) (Oral)   Resp 16   Ht 5\' 3"  (1.6 m)   Wt 96 lb 6.4 oz (43.7 kg)   SpO2 99%   BMI 17.08 kg/m  General:   Well developed, NAD, chronically ill-appearing and underweight appearing HEENT:  Normocephalic .   atraumatic  Temple palpation: Good pulses, no TTP. Lungs:  decreased breath sounds Normal respiratory effort, no intercostal retractions, no accessory muscle use. Heart: RRR,  no murmur.  No pretibial edema bilaterally  Skin: Not pale. Not jaundice Neurologic:  alert & oriented X3.  Speech normal, gait appropriate for age and unassisted Psych--  Cognition and judgment appear intact.  Cooperative with normal attention span and concentration.  Behavior appropriate. No anxious or depressed appearing.      Assessment & Plan:   Assessment COPD,Chronic respiratory failure ----->  O2 24/7. 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 Depression: Well-controlled on Lexapro Osteoporosis: On Prolia.  She currently takes  calcium with vitamin D (800 units) twice a day and additionally a capsule of vitamin D 1000 units daily.  Total of 2600 units of vitamin D.  Will check levels to be sure she is not overdoing it. Preventive care: Interested on Shingrix, prescription provided.  Plans to have her flu shot in September when she sees pulmonary. RTC 04/2018 CPX

## 2018-01-19 NOTE — Assessment & Plan Note (Signed)
Chronic respiratory failure, seen by pulmonary 10/2017, on zithromax qd to prevent exacerbations. Depression: Well-controlled on Lexapro Osteoporosis: On Prolia.  She currently takes calcium with vitamin D (800 units) twice a day and additionally a capsule of vitamin D 1000 units daily.  Total of 2600 units of vitamin D.  Will check levels to be sure she is not overdoing it. Preventive care: Interested on Shingrix, prescription provided.  Plans to have her flu shot in September when she sees pulmonary. RTC 04/2018 CPX

## 2018-01-19 NOTE — Patient Instructions (Signed)
GO TO THE LAB : Get the blood work     GO TO THE FRONT DESK Schedule your next appointment for a physical exam by December 2019

## 2018-01-22 LAB — VITAMIN D 1,25 DIHYDROXY
VITAMIN D 1, 25 (OH) TOTAL: 77 pg/mL — AB (ref 18–72)
Vitamin D2 1, 25 (OH)2: <8 pg/mL
Vitamin D3 1, 25 (OH)2: 77 pg/mL

## 2018-01-29 ENCOUNTER — Other Ambulatory Visit: Payer: Self-pay | Admitting: Pulmonary Disease

## 2018-02-09 ENCOUNTER — Ambulatory Visit: Payer: Medicare Other | Admitting: Adult Health

## 2018-02-09 ENCOUNTER — Ambulatory Visit: Payer: Self-pay | Admitting: Pulmonary Disease

## 2018-02-09 ENCOUNTER — Encounter: Payer: Self-pay | Admitting: Adult Health

## 2018-02-09 DIAGNOSIS — M79601 Pain in right arm: Secondary | ICD-10-CM | POA: Diagnosis not present

## 2018-02-09 DIAGNOSIS — R634 Abnormal weight loss: Secondary | ICD-10-CM

## 2018-02-09 DIAGNOSIS — J9611 Chronic respiratory failure with hypoxia: Secondary | ICD-10-CM

## 2018-02-09 DIAGNOSIS — J9612 Chronic respiratory failure with hypercapnia: Secondary | ICD-10-CM

## 2018-02-09 DIAGNOSIS — M79603 Pain in arm, unspecified: Secondary | ICD-10-CM | POA: Insufficient documentation

## 2018-02-09 DIAGNOSIS — J411 Mucopurulent chronic bronchitis: Secondary | ICD-10-CM

## 2018-02-09 DIAGNOSIS — Z23 Encounter for immunization: Secondary | ICD-10-CM

## 2018-02-09 NOTE — Patient Instructions (Addendum)
Flu shot today .  Continue on Dulera 2 puffs Twice daily , rinse after use .  Continue on Spiriva 1 puff daily , rinse after use.  Make appointment with Dr. Larose Kells for shoulder /arm pain  Saline nasal spray and gel As needed   Follow up with Dr. Lake Bells in 3 months and As needed

## 2018-02-09 NOTE — Progress Notes (Signed)
Reviewed, agree 

## 2018-02-09 NOTE — Progress Notes (Signed)
@Patient  ID: Helen Scott, female    DOB: [birth year], 67 y.o.   MRN: [id number]  Chief Complaint  Patient presents with  . Follow-up    Bronchitis     Referring provider: Colon Branch, MD  HPI: 67 year old female former smoker followed for COPD and oxygen dependent respiratory failure  TEST  Echocardiogram: August 2017 LVEF 55-60%, mild aortic  Cardiac stress test: August 2017 no evidence of ischemia  CXR 2017 >COPD/Emphysema   pfts 2010:  FEV1 0.49 20%  (ratio 27%).  02/09/2018 Follow up : COPD , o2 RF  Patient presents for a 6-month follow-up.  Patient has underlying very severe COPD .  Is on Dulera and Spiriva.  Says she has good days and bad days.  Feels that today is 1 of her better days.  She gets winded with minimal activity.  She is on continuous flow oxygen 3 L at rest and 4 L walking.  Says that she is trying to eat more.  Weight is up 3 pounds.  Discussed adding Ensure Does get nasal irritation at times. She denies any chest pain orthopnea PND or leg swelling. Has noticed over the last couple days of some right arm irritation and pain also some irritation along her right eye.  No loss of speech trouble swallowing rash or chest pain.  Allergies  Allergen Reactions  . Latex Other (See Comments)    Only Latex Tape causes Rash and abrasive     Immunization History  Administered Date(s) Administered  . Influenza Split 02/03/2012  . Influenza Whole 03/11/2007, 02/20/2008, 02/11/2010, 01/28/2011  . Influenza, High Dose Seasonal PF 02/12/2015, 02/10/2016, 02/18/2017, 02/09/2018  . Influenza,inj,Quad PF,6+ Mos 02/02/2013, 02/02/2014  . Pneumococcal Conjugate-13 12/08/2013, 03/10/2014  . Pneumococcal Polysaccharide-23 11/08/2003, 02/18/2009, 12/03/2015  . Td 03/11/2007, 10/15/2017  . Zoster 02/21/2010    Past Medical History:  Diagnosis Date  . CVA (cerebral infarction) 06/1999  . Emphysema    FEV1 0.49, FEV1% 27 on 5/10  . Hyperlipidemia   . OSTEOPENIA  03/27/2009  . PUD (peptic ulcer disease)     s/p partial gastrectomy 1985.      Tobacco History: Social History   Tobacco Use  Smoking Status Former Smoker  . Packs/day: 1.50  . Years: 40.00  . Pack years: 60.00  . Types: Cigarettes  . Last attempt to quit: 05/25/2005  . Years since quitting: 12.7  Smokeless Tobacco Never Used  Tobacco Comment   1 ppd started at age 12   Counseling given: Not Answered Comment: 1 ppd started at age 60   Outpatient Medications Prior to Visit  Medication Sig Dispense Refill  . albuterol (PROVENTIL) (2.5 MG/3ML) 0.083% nebulizer solution Take 2.5 mg by nebulization every 6 (six) hours as needed for wheezing or shortness of breath.    Marland Kitchen albuterol (VENTOLIN HFA) 108 (90 Base) MCG/ACT inhaler Inhale 1-2 puffs into the lungs every 4 (four) hours as needed for wheezing or shortness of breath. 18 g 5  . azithromycin (ZITHROMAX) 250 MG tablet TAKE 1 TABLET(250 MG) BY MOUTH DAILY 30 tablet 2  . Calcium Carbonate-Vitamin D (CALCIUM-CARB 600 + D) 600-125 MG-UNIT TABS Take 1 tablet by mouth 2 (two) times daily.      . Cholecalciferol (VITAMIN D3) 1000 UNITS CAPS Take 1 capsule by mouth daily.      . clopidogrel (PLAVIX) 75 MG tablet Take 1 tablet (75 mg total) by mouth daily. 90 tablet 2  . denosumab (PROLIA) 60 MG/ML SOSY injection Inject 60  mg into the skin every 6 (six) months. 1 Syringe 1  . DULERA 100-5 MCG/ACT AERO INHALE 2 PUFFS BY MOUTH TWICE DAILY 39 g 3  . escitalopram (LEXAPRO) 10 MG tablet Take 0.5 tablets (5 mg total) by mouth daily. 45 tablet 1  . Respiratory Therapy Supplies (FLUTTER) DEVI Use as directed 1 each 0  . simvastatin (ZOCOR) 40 MG tablet Take 1 tablet (40 mg total) by mouth at bedtime. 90 tablet 2  . SPIRIVA HANDIHALER 18 MCG inhalation capsule INHALE THE CONTENTS OF 1 CAPSULE VIA INHALATION DEVICE EVERY DAY 90 capsule 1  . denosumab (PROLIA) 60 MG/ML SOLN injection Inject 60 mg into the skin every 6 (six) months. 1 Syringe 0   No  facility-administered medications prior to visit.      Review of Systems  Constitutional:   No  weight loss, night sweats,  Fevers, chills,  +fatigue, or  lassitude.  HEENT:   No headaches,  Difficulty swallowing,  Tooth/dental problems, or  Sore throat,                No sneezing, itching, ear ache,  +nasal congestion, post nasal drip,   CV:  No chest pain,  Orthopnea, PND, swelling in lower extremities, anasarca, dizziness, palpitations, syncope.   GI  No heartburn, indigestion, abdominal pain, nausea, vomiting, diarrhea, change in bowel habits, loss of appetite, bloody stools.   Resp:    No chest wall deformity  Skin: no rash or lesions.  GU: no dysuria, change in color of urine, no urgency or frequency.  No flank pain, no hematuria   MS:  No joint pain or swelling.  No decreased range of motion.  No back pain.    Physical Exam  BP (!) 120/58 (BP Location: Left Arm, Cuff Size: Normal)   Pulse 99   Ht 5\' 2"  (1.575 m)   Wt 99 lb 3.2 oz (45 kg)   SpO2 93%   BMI 18.14 kg/m   GEN: A/Ox3; pleasant , NAD, thin and frail on oxygen   HEENT:  [place]/AT,  EACs-clear, TMs-wnl, NOSE-clear, THROAT-clear, no lesions, no postnasal drip or exudate noted.   NECK:  Supple w/ fair ROM; no JVD; normal carotid impulses w/o bruits; no thyromegaly or nodules palpated; no lymphadenopathy.    RESP decreased breath sounds in the bases . no accessory muscle use, no dullness to percussion  CARD:  RRR, no m/r/g, no peripheral edema, pulses intact, no cyanosis or clubbing.  GI:   Soft & nt; nml bowel sounds; no organomegaly or masses detected.   Musco: Warm bil, no deformities or joint swelling noted.   Neuro: alert, no focal deficits noted.  Nml grips, Ma[place], no facial droop , facial features symmetrical .   Skin: Warm, no lesions or rashes    Lab Results:  CBC   BNP No results found for: BNP  ProBNP No results found for: PROBNP  Imaging: No results found.   Assessment & Plan:    No problem-specific Assessment & Plan notes found for this encounter.     Rexene Edison, NP 02/09/2018

## 2018-02-09 NOTE — Assessment & Plan Note (Signed)
Right arm pain - ? Etiology  Neuro exam appears in tact . Advised pt to contact PCP for evaluation

## 2018-02-09 NOTE — Assessment & Plan Note (Signed)
Malnutrition.  Patient is encouraged to add in Ensure

## 2018-02-09 NOTE — Assessment & Plan Note (Signed)
Continue on oxygen 4 L with activity and 3 L at rest

## 2018-02-09 NOTE — Assessment & Plan Note (Addendum)
Very severe COPD.  Patient is currently stable on her present regimen.  No recent flares. Patient is continue on [place] and Spiriva. Continue on oxygen. Flu shot today Decline chest  x-ray today.  We will get this next visit

## 2018-02-12 DIAGNOSIS — J449 Chronic obstructive pulmonary disease, unspecified: Secondary | ICD-10-CM | POA: Diagnosis not present

## 2018-02-25 ENCOUNTER — Other Ambulatory Visit: Payer: Self-pay | Admitting: Internal Medicine

## 2018-03-12 DIAGNOSIS — J441 Chronic obstructive pulmonary disease with (acute) exacerbation: Secondary | ICD-10-CM | POA: Diagnosis not present

## 2018-03-12 DIAGNOSIS — R42 Dizziness and giddiness: Secondary | ICD-10-CM | POA: Diagnosis not present

## 2018-03-12 DIAGNOSIS — E1165 Type 2 diabetes mellitus with hyperglycemia: Secondary | ICD-10-CM | POA: Diagnosis not present

## 2018-03-12 DIAGNOSIS — R5383 Other fatigue: Secondary | ICD-10-CM | POA: Diagnosis not present

## 2018-03-12 DIAGNOSIS — K921 Melena: Secondary | ICD-10-CM | POA: Diagnosis not present

## 2018-03-12 DIAGNOSIS — R0689 Other abnormalities of breathing: Secondary | ICD-10-CM | POA: Diagnosis not present

## 2018-03-12 DIAGNOSIS — Z9981 Dependence on supplemental oxygen: Secondary | ICD-10-CM | POA: Diagnosis not present

## 2018-03-12 DIAGNOSIS — Z8673 Personal history of transient ischemic attack (TIA), and cerebral infarction without residual deficits: Secondary | ICD-10-CM | POA: Diagnosis not present

## 2018-03-12 DIAGNOSIS — K51211 Ulcerative (chronic) proctitis with rectal bleeding: Secondary | ICD-10-CM | POA: Diagnosis not present

## 2018-03-12 DIAGNOSIS — Z79899 Other long term (current) drug therapy: Secondary | ICD-10-CM | POA: Diagnosis not present

## 2018-03-12 DIAGNOSIS — Z7902 Long term (current) use of antithrombotics/antiplatelets: Secondary | ICD-10-CM | POA: Diagnosis not present

## 2018-03-12 DIAGNOSIS — R000 Tachycardia, unspecified: Secondary | ICD-10-CM | POA: Diagnosis not present

## 2018-03-12 DIAGNOSIS — R0602 Shortness of breath: Secondary | ICD-10-CM | POA: Diagnosis not present

## 2018-03-12 DIAGNOSIS — R58 Hemorrhage, not elsewhere classified: Secondary | ICD-10-CM | POA: Diagnosis not present

## 2018-03-12 DIAGNOSIS — R109 Unspecified abdominal pain: Secondary | ICD-10-CM | POA: Diagnosis not present

## 2018-03-13 DIAGNOSIS — D62 Acute posthemorrhagic anemia: Secondary | ICD-10-CM | POA: Diagnosis not present

## 2018-03-13 DIAGNOSIS — Z8673 Personal history of transient ischemic attack (TIA), and cerebral infarction without residual deficits: Secondary | ICD-10-CM | POA: Diagnosis not present

## 2018-03-13 DIAGNOSIS — K644 Residual hemorrhoidal skin tags: Secondary | ICD-10-CM | POA: Diagnosis not present

## 2018-03-13 DIAGNOSIS — Z87891 Personal history of nicotine dependence: Secondary | ICD-10-CM | POA: Diagnosis not present

## 2018-03-13 DIAGNOSIS — K625 Hemorrhage of anus and rectum: Secondary | ICD-10-CM | POA: Diagnosis not present

## 2018-03-13 DIAGNOSIS — E785 Hyperlipidemia, unspecified: Secondary | ICD-10-CM | POA: Diagnosis not present

## 2018-03-13 DIAGNOSIS — D500 Iron deficiency anemia secondary to blood loss (chronic): Secondary | ICD-10-CM | POA: Diagnosis not present

## 2018-03-13 DIAGNOSIS — K5731 Diverticulosis of large intestine without perforation or abscess with bleeding: Secondary | ICD-10-CM | POA: Diagnosis not present

## 2018-03-13 DIAGNOSIS — J432 Centrilobular emphysema: Secondary | ICD-10-CM | POA: Diagnosis not present

## 2018-03-13 DIAGNOSIS — Z8711 Personal history of peptic ulcer disease: Secondary | ICD-10-CM | POA: Diagnosis not present

## 2018-03-13 DIAGNOSIS — Z7901 Long term (current) use of anticoagulants: Secondary | ICD-10-CM | POA: Diagnosis not present

## 2018-03-13 DIAGNOSIS — J9611 Chronic respiratory failure with hypoxia: Secondary | ICD-10-CM | POA: Diagnosis not present

## 2018-03-13 DIAGNOSIS — K2950 Unspecified chronic gastritis without bleeding: Secondary | ICD-10-CM | POA: Diagnosis not present

## 2018-03-13 DIAGNOSIS — K6289 Other specified diseases of anus and rectum: Secondary | ICD-10-CM | POA: Diagnosis not present

## 2018-03-13 DIAGNOSIS — K921 Melena: Secondary | ICD-10-CM | POA: Diagnosis not present

## 2018-03-13 DIAGNOSIS — K635 Polyp of colon: Secondary | ICD-10-CM | POA: Diagnosis not present

## 2018-03-13 DIAGNOSIS — Z9981 Dependence on supplemental oxygen: Secondary | ICD-10-CM | POA: Diagnosis not present

## 2018-03-13 DIAGNOSIS — J449 Chronic obstructive pulmonary disease, unspecified: Secondary | ICD-10-CM | POA: Diagnosis not present

## 2018-03-13 DIAGNOSIS — Z903 Acquired absence of stomach [part of]: Secondary | ICD-10-CM | POA: Diagnosis not present

## 2018-03-14 ENCOUNTER — Telehealth: Payer: Self-pay | Admitting: Internal Medicine

## 2018-03-14 DIAGNOSIS — D123 Benign neoplasm of transverse colon: Secondary | ICD-10-CM | POA: Diagnosis not present

## 2018-03-14 DIAGNOSIS — K625 Hemorrhage of anus and rectum: Secondary | ICD-10-CM | POA: Diagnosis not present

## 2018-03-14 DIAGNOSIS — Z8711 Personal history of peptic ulcer disease: Secondary | ICD-10-CM | POA: Diagnosis not present

## 2018-03-14 DIAGNOSIS — D62 Acute posthemorrhagic anemia: Secondary | ICD-10-CM | POA: Diagnosis not present

## 2018-03-14 DIAGNOSIS — J449 Chronic obstructive pulmonary disease, unspecified: Secondary | ICD-10-CM | POA: Diagnosis not present

## 2018-03-14 DIAGNOSIS — K635 Polyp of colon: Secondary | ICD-10-CM | POA: Diagnosis not present

## 2018-03-14 DIAGNOSIS — Z8673 Personal history of transient ischemic attack (TIA), and cerebral infarction without residual deficits: Secondary | ICD-10-CM | POA: Diagnosis not present

## 2018-03-14 DIAGNOSIS — K5731 Diverticulosis of large intestine without perforation or abscess with bleeding: Secondary | ICD-10-CM | POA: Diagnosis not present

## 2018-03-14 DIAGNOSIS — D122 Benign neoplasm of ascending colon: Secondary | ICD-10-CM | POA: Diagnosis not present

## 2018-03-14 DIAGNOSIS — Z903 Acquired absence of stomach [part of]: Secondary | ICD-10-CM | POA: Diagnosis not present

## 2018-03-14 DIAGNOSIS — K6289 Other specified diseases of anus and rectum: Secondary | ICD-10-CM | POA: Diagnosis not present

## 2018-03-14 DIAGNOSIS — D500 Iron deficiency anemia secondary to blood loss (chronic): Secondary | ICD-10-CM | POA: Diagnosis not present

## 2018-03-14 DIAGNOSIS — Z87891 Personal history of nicotine dependence: Secondary | ICD-10-CM | POA: Diagnosis not present

## 2018-03-14 DIAGNOSIS — J432 Centrilobular emphysema: Secondary | ICD-10-CM | POA: Diagnosis not present

## 2018-03-14 DIAGNOSIS — K644 Residual hemorrhoidal skin tags: Secondary | ICD-10-CM | POA: Diagnosis not present

## 2018-03-14 DIAGNOSIS — J9611 Chronic respiratory failure with hypoxia: Secondary | ICD-10-CM | POA: Diagnosis not present

## 2018-03-14 DIAGNOSIS — D120 Benign neoplasm of cecum: Secondary | ICD-10-CM | POA: Diagnosis not present

## 2018-03-14 DIAGNOSIS — Z9981 Dependence on supplemental oxygen: Secondary | ICD-10-CM | POA: Diagnosis not present

## 2018-03-14 DIAGNOSIS — Z7901 Long term (current) use of anticoagulants: Secondary | ICD-10-CM | POA: Diagnosis not present

## 2018-03-14 DIAGNOSIS — E785 Hyperlipidemia, unspecified: Secondary | ICD-10-CM | POA: Diagnosis not present

## 2018-03-14 MED ORDER — PANTOPRAZOLE SODIUM 40 MG IV SOLR
40.00 | INTRAVENOUS | Status: DC
Start: 2018-03-16 — End: 2018-03-14

## 2018-03-14 MED ORDER — ALBUTEROL SULFATE (5 MG/ML) 0.5% IN NEBU
2.50 | INHALATION_SOLUTION | RESPIRATORY_TRACT | Status: DC
Start: ? — End: 2018-03-14

## 2018-03-14 MED ORDER — FLUTICASONE FUROATE-VILANTEROL 100-25 MCG/INH IN AEPB
1.00 | INHALATION_SPRAY | RESPIRATORY_TRACT | Status: DC
Start: 2018-03-17 — End: 2018-03-14

## 2018-03-14 MED ORDER — ONDANSETRON HCL 4 MG/2ML IJ SOLN
4.00 | INTRAMUSCULAR | Status: DC
Start: ? — End: 2018-03-14

## 2018-03-14 MED ORDER — IPRATROPIUM BROMIDE 0.02 % IN SOLN
0.50 | RESPIRATORY_TRACT | Status: DC
Start: ? — End: 2018-03-14

## 2018-03-14 MED ORDER — ACETAMINOPHEN 650 MG/20.3ML PO SOLN
650.00 | ORAL | Status: DC
Start: ? — End: 2018-03-14

## 2018-03-14 NOTE — Telephone Encounter (Signed)
Copied from [place] [id number]. Topic: Quick Communication - Home Health Verbal Orders >> Mar 14, 2018  4:13 PM Margot Ables wrote: Caller/Agency: Lavella Lemons w/[facility] Callback Number: [phone] secure VM if not able to answer Requesting OT/PT/Skilled Nursing/Social Work: PT eval and treat upon hospital d/c - expected d/c today 10/21 or tomorrow 10/22

## 2018-03-14 NOTE — Telephone Encounter (Signed)
Spoke w/ Lavella Lemons- verbal orders given.

## 2018-03-15 DIAGNOSIS — J449 Chronic obstructive pulmonary disease, unspecified: Secondary | ICD-10-CM | POA: Diagnosis not present

## 2018-03-15 DIAGNOSIS — Z903 Acquired absence of stomach [part of]: Secondary | ICD-10-CM | POA: Diagnosis not present

## 2018-03-15 DIAGNOSIS — J9611 Chronic respiratory failure with hypoxia: Secondary | ICD-10-CM | POA: Diagnosis not present

## 2018-03-15 DIAGNOSIS — Z7901 Long term (current) use of anticoagulants: Secondary | ICD-10-CM | POA: Diagnosis not present

## 2018-03-15 DIAGNOSIS — K6289 Other specified diseases of anus and rectum: Secondary | ICD-10-CM | POA: Diagnosis not present

## 2018-03-15 DIAGNOSIS — Z8673 Personal history of transient ischemic attack (TIA), and cerebral infarction without residual deficits: Secondary | ICD-10-CM | POA: Diagnosis not present

## 2018-03-15 DIAGNOSIS — E785 Hyperlipidemia, unspecified: Secondary | ICD-10-CM | POA: Diagnosis not present

## 2018-03-15 DIAGNOSIS — D62 Acute posthemorrhagic anemia: Secondary | ICD-10-CM | POA: Diagnosis not present

## 2018-03-15 DIAGNOSIS — K625 Hemorrhage of anus and rectum: Secondary | ICD-10-CM | POA: Diagnosis not present

## 2018-03-15 DIAGNOSIS — Z9981 Dependence on supplemental oxygen: Secondary | ICD-10-CM | POA: Diagnosis not present

## 2018-03-15 DIAGNOSIS — K644 Residual hemorrhoidal skin tags: Secondary | ICD-10-CM | POA: Diagnosis not present

## 2018-03-15 DIAGNOSIS — K5731 Diverticulosis of large intestine without perforation or abscess with bleeding: Secondary | ICD-10-CM | POA: Diagnosis not present

## 2018-03-15 DIAGNOSIS — K635 Polyp of colon: Secondary | ICD-10-CM | POA: Diagnosis not present

## 2018-03-15 DIAGNOSIS — D500 Iron deficiency anemia secondary to blood loss (chronic): Secondary | ICD-10-CM | POA: Diagnosis not present

## 2018-03-15 DIAGNOSIS — J432 Centrilobular emphysema: Secondary | ICD-10-CM | POA: Diagnosis not present

## 2018-03-15 DIAGNOSIS — Z8711 Personal history of peptic ulcer disease: Secondary | ICD-10-CM | POA: Diagnosis not present

## 2018-03-15 DIAGNOSIS — Z87891 Personal history of nicotine dependence: Secondary | ICD-10-CM | POA: Diagnosis not present

## 2018-03-16 ENCOUNTER — Telehealth: Payer: Self-pay | Admitting: *Deleted

## 2018-03-16 DIAGNOSIS — Z903 Acquired absence of stomach [part of]: Secondary | ICD-10-CM | POA: Diagnosis not present

## 2018-03-16 DIAGNOSIS — J432 Centrilobular emphysema: Secondary | ICD-10-CM | POA: Diagnosis not present

## 2018-03-16 DIAGNOSIS — Z8711 Personal history of peptic ulcer disease: Secondary | ICD-10-CM | POA: Diagnosis not present

## 2018-03-16 DIAGNOSIS — J449 Chronic obstructive pulmonary disease, unspecified: Secondary | ICD-10-CM | POA: Diagnosis not present

## 2018-03-16 DIAGNOSIS — Z7901 Long term (current) use of anticoagulants: Secondary | ICD-10-CM | POA: Diagnosis not present

## 2018-03-16 DIAGNOSIS — K5731 Diverticulosis of large intestine without perforation or abscess with bleeding: Secondary | ICD-10-CM | POA: Diagnosis not present

## 2018-03-16 DIAGNOSIS — K625 Hemorrhage of anus and rectum: Secondary | ICD-10-CM | POA: Diagnosis not present

## 2018-03-16 DIAGNOSIS — D500 Iron deficiency anemia secondary to blood loss (chronic): Secondary | ICD-10-CM | POA: Diagnosis not present

## 2018-03-16 DIAGNOSIS — K644 Residual hemorrhoidal skin tags: Secondary | ICD-10-CM | POA: Diagnosis not present

## 2018-03-16 DIAGNOSIS — Z8673 Personal history of transient ischemic attack (TIA), and cerebral infarction without residual deficits: Secondary | ICD-10-CM | POA: Diagnosis not present

## 2018-03-16 DIAGNOSIS — E785 Hyperlipidemia, unspecified: Secondary | ICD-10-CM | POA: Diagnosis not present

## 2018-03-16 DIAGNOSIS — Z87891 Personal history of nicotine dependence: Secondary | ICD-10-CM | POA: Diagnosis not present

## 2018-03-16 DIAGNOSIS — D62 Acute posthemorrhagic anemia: Secondary | ICD-10-CM | POA: Diagnosis not present

## 2018-03-16 DIAGNOSIS — J9611 Chronic respiratory failure with hypoxia: Secondary | ICD-10-CM | POA: Diagnosis not present

## 2018-03-16 DIAGNOSIS — Z9981 Dependence on supplemental oxygen: Secondary | ICD-10-CM | POA: Diagnosis not present

## 2018-03-16 DIAGNOSIS — K6289 Other specified diseases of anus and rectum: Secondary | ICD-10-CM | POA: Diagnosis not present

## 2018-03-16 DIAGNOSIS — K635 Polyp of colon: Secondary | ICD-10-CM | POA: Diagnosis not present

## 2018-03-16 MED ORDER — ATORVASTATIN CALCIUM 40 MG PO TABS
40.00 | ORAL_TABLET | ORAL | Status: DC
Start: 2018-03-16 — End: 2018-03-16

## 2018-03-16 MED ORDER — AZITHROMYCIN 250 MG PO TABS
250.00 | ORAL_TABLET | ORAL | Status: DC
Start: 2018-03-17 — End: 2018-03-16

## 2018-03-16 MED ORDER — SODIUM CHLORIDE FLUSH 0.9 % IV SOLN
5.00 | INTRAVENOUS | Status: DC
Start: 2018-03-16 — End: 2018-03-16

## 2018-03-16 MED ORDER — UMECLIDINIUM BROMIDE 62.5 MCG/INH IN AEPB
1.00 | INHALATION_SPRAY | RESPIRATORY_TRACT | Status: DC
Start: 2018-03-17 — End: 2018-03-16

## 2018-03-16 MED ORDER — ESCITALOPRAM OXALATE 5 MG PO TABS
5.00 | ORAL_TABLET | ORAL | Status: DC
Start: 2018-03-17 — End: 2018-03-16

## 2018-03-16 NOTE — Telephone Encounter (Signed)
[facility] [place]; forwarded to provider/SLS 10/23

## 2018-03-17 ENCOUNTER — Telehealth: Payer: Self-pay

## 2018-03-17 NOTE — Telephone Encounter (Addendum)
03/17/18   Transition Care Management Follow-up Telephone Call  ADMISSION DATE: 03/13/18 DISCHARGE DATE: 03/16/18  How have you been since you were released from the hospital? Feeling weak per patient.   Do you understand why you were in the hospital? Yes   Do you understand the discharge instrcutions? Yes    Items Reviewed: Medications reviewed: Yes  Allergies reviewed: NKDA   Dietary changes reviewed: Yes   Referrals reviewed: Appointments scheduled  Confirmed importance and date/time of follow-up visits scheduled:Yes   Confirmed with patient if condition begins to worsen call PCP or go to the ER. Yes    Patient was given the office number and encouragred to call back with questions or concerns.Yes

## 2018-03-17 NOTE — Telephone Encounter (Signed)
Form signed and faxed to [place] at [phone]. Form sent for scanning.

## 2018-03-18 DIAGNOSIS — D62 Acute posthemorrhagic anemia: Secondary | ICD-10-CM | POA: Diagnosis not present

## 2018-03-18 DIAGNOSIS — J9611 Chronic respiratory failure with hypoxia: Secondary | ICD-10-CM | POA: Diagnosis not present

## 2018-03-18 DIAGNOSIS — J432 Centrilobular emphysema: Secondary | ICD-10-CM | POA: Diagnosis not present

## 2018-03-18 DIAGNOSIS — K6289 Other specified diseases of anus and rectum: Secondary | ICD-10-CM | POA: Diagnosis not present

## 2018-03-18 DIAGNOSIS — Z9981 Dependence on supplemental oxygen: Secondary | ICD-10-CM | POA: Diagnosis not present

## 2018-03-21 ENCOUNTER — Telehealth: Payer: Self-pay | Admitting: Internal Medicine

## 2018-03-21 NOTE — Telephone Encounter (Signed)
Copied from [place] [id number]. Topic: Quick Communication - Home Health Verbal Orders >> Mar 21, 2018  4:00 PM Nils Flack wrote: [facility]Agency[facility]  Callback Number: [phone] Requesting OT/PT/Skilled Nursing/Social Work: OT and Speech therapy  Frequency: for eval

## 2018-03-21 NOTE — Telephone Encounter (Signed)
Spoke w/ Roland- verbal orders given.

## 2018-03-24 ENCOUNTER — Ambulatory Visit: Payer: Self-pay | Admitting: Internal Medicine

## 2018-03-30 ENCOUNTER — Inpatient Hospital Stay: Payer: Self-pay | Admitting: Internal Medicine

## 2018-03-30 DIAGNOSIS — J432 Centrilobular emphysema: Secondary | ICD-10-CM | POA: Diagnosis not present

## 2018-03-30 DIAGNOSIS — Z9981 Dependence on supplemental oxygen: Secondary | ICD-10-CM | POA: Diagnosis not present

## 2018-03-30 DIAGNOSIS — J9611 Chronic respiratory failure with hypoxia: Secondary | ICD-10-CM | POA: Diagnosis not present

## 2018-03-30 DIAGNOSIS — K6289 Other specified diseases of anus and rectum: Secondary | ICD-10-CM | POA: Diagnosis not present

## 2018-03-31 ENCOUNTER — Telehealth: Payer: Self-pay

## 2018-03-31 NOTE — Telephone Encounter (Signed)
Plan of care received from [facility]- form signed and faxed back to [phone]. Form sent for scanning.

## 2018-04-01 ENCOUNTER — Telehealth: Payer: Self-pay | Admitting: *Deleted

## 2018-04-01 NOTE — Telephone Encounter (Signed)
Received Physician Orders from [facility]; forwarded to provider/SLS 11/08  

## 2018-04-04 NOTE — Telephone Encounter (Signed)
Orders signed and faxed to [facility] at [phone]. Form sent for scanning.  

## 2018-04-06 ENCOUNTER — Inpatient Hospital Stay: Payer: Medicare Other | Admitting: Internal Medicine

## 2018-04-12 DIAGNOSIS — D62 Acute posthemorrhagic anemia: Secondary | ICD-10-CM | POA: Diagnosis not present

## 2018-04-12 DIAGNOSIS — K5520 Angiodysplasia of colon without hemorrhage: Secondary | ICD-10-CM | POA: Diagnosis not present

## 2018-04-13 ENCOUNTER — Telehealth: Payer: Self-pay | Admitting: Internal Medicine

## 2018-04-13 NOTE — Telephone Encounter (Signed)
Copied from [place] [id number]. Topic: General - Other >> Apr 13, 2018  3:12 PM Keene Breath wrote: Reason for CRM: Charisse with [facility] called to request verbal orders for PT for patient - PT 2x wk for 3 wks, starting next week.  Please advise.  CB# [phone]

## 2018-04-13 NOTE — Telephone Encounter (Signed)
Verbal orders given  

## 2018-04-14 ENCOUNTER — Encounter: Payer: Self-pay | Admitting: Internal Medicine

## 2018-04-14 ENCOUNTER — Ambulatory Visit: Payer: Medicare Other | Admitting: Internal Medicine

## 2018-04-14 VITALS — BP 136/58 | HR 128 | Temp 98.9°F | Resp 20 | Ht 62.0 in | Wt 95.0 lb

## 2018-04-14 DIAGNOSIS — D649 Anemia, unspecified: Secondary | ICD-10-CM | POA: Diagnosis not present

## 2018-04-14 DIAGNOSIS — J9612 Chronic respiratory failure with hypercapnia: Secondary | ICD-10-CM

## 2018-04-14 DIAGNOSIS — J9611 Chronic respiratory failure with hypoxia: Secondary | ICD-10-CM

## 2018-04-14 DIAGNOSIS — K6289 Other specified diseases of anus and rectum: Secondary | ICD-10-CM | POA: Diagnosis not present

## 2018-04-14 DIAGNOSIS — R634 Abnormal weight loss: Secondary | ICD-10-CM | POA: Diagnosis not present

## 2018-04-14 DIAGNOSIS — J449 Chronic obstructive pulmonary disease, unspecified: Secondary | ICD-10-CM | POA: Diagnosis not present

## 2018-04-14 LAB — COMPREHENSIVE METABOLIC PANEL
ALBUMIN: 4 g/dL (ref 3.5–5.2)
ALT: 7 U/L (ref 0–35)
AST: 15 U/L (ref 0–37)
Alkaline Phosphatase: 43 U/L (ref 39–117)
BUN: 11 mg/dL (ref 6–23)
CALCIUM: 9 mg/dL (ref 8.4–10.5)
CHLORIDE: 94 meq/L — AB (ref 96–112)
CO2: 43 mEq/L — ABNORMAL HIGH (ref 19–32)
Creatinine, Ser: 0.49 mg/dL (ref 0.40–1.20)
GFR: 133.38 mL/min (ref >60.00)
Glucose, Bld: 150 mg/dL — ABNORMAL HIGH (ref 70–99)
POTASSIUM: 4.1 meq/L (ref 3.5–5.1)
SODIUM: 143 meq/L (ref 135–145)
Total Bilirubin: 0.3 mg/dL (ref 0.2–1.2)
Total Protein: 6.4 g/dL (ref 6.0–8.3)

## 2018-04-14 LAB — CBC WITH DIFFERENTIAL/PLATELET
BASOS PCT: 0.6 % (ref 0.0–3.0)
Basophils Absolute: 0 10*3/uL (ref 0.0–0.1)
EOS PCT: 1 % (ref 0.0–5.0)
Eosinophils Absolute: 0 10*3/uL (ref 0.0–0.7)
Lymphocytes Relative: 13.1 % (ref 12.0–46.0)
Lymphs Abs: 0.6 10*3/uL — ABNORMAL LOW (ref 0.7–4.0)
MCHC: 31.4 g/dL (ref 30.0–36.0)
MCV: 85.4 fl (ref 78.0–100.0)
MONO ABS: 0.4 10*3/uL (ref 0.1–1.0)
Monocytes Relative: 8.8 % (ref 3.0–12.0)
NEUTROS ABS: 3.3 10*3/uL (ref 1.4–7.7)
Neutrophils Relative %: 76.5 % (ref 43.0–77.0)
Platelets: 324 10*3/uL (ref 150.0–400.0)
RBC: 3.13 Mil/uL — ABNORMAL LOW (ref 3.87–5.11)
RDW: 15.2 % (ref 11.5–15.5)
WBC: 4.3 10*3/uL (ref 4.0–10.5)

## 2018-04-14 LAB — IRON: Iron: 13 ug/dL — ABNORMAL LOW (ref 42–145)

## 2018-04-14 LAB — FERRITIN: Ferritin: 5.5 ng/mL — ABNORMAL LOW (ref 10.0–291.0)

## 2018-04-14 NOTE — Patient Instructions (Addendum)
Please schedule Medical Wellness visit with [facility].   GO TO THE LAB : Get the blood work     GO TO THE FRONT DESK Schedule your next appointment for a checkup in 3 months   ER if increased respiratory problems or call your pulmonologist  ER if red blood per rectum, fever, chills or abdominal pain.

## 2018-04-14 NOTE — Progress Notes (Signed)
Pre visit review using our clinic review tool, if applicable. No additional management support is needed unless otherwise documented below in the visit note. 

## 2018-04-14 NOTE — Progress Notes (Signed)
Subjective:    Patient ID: LARAMIE GELLES, female    DOB: 08-Jun-1950, 67 y.o.   MRN: [id number]  DOS:  04/14/2018 Type of visit - description : Hospital follow-up, no TCM.  Was admitted to an outside hospital, then  transfer to [facility].  Eventually  discharged 03/16/2018. One day prior to admission, had large BM with rectal bleeding. Initial hemoglobin 9.9.  CT of the abdomen show mild circumferential wall thickening of the distal rectum, possibly due to mild proctitis but no diverticulitis. Subsequently was transferred to [facility]. Over there, she had a EGD and a colonoscopy, 3 small polyps noted on removed.  She was recommended on outpatient capsule study.  Here hemoglobin dropped as low as 7.6 , no transfusion was necessary. Labs: 03/15/2018: Hemoglobin was 8.3, platelets 124, Iron 24, low. Transferrin 248 normal. Polyps were tubular adenomatous. Gastric biopsy benign mucosa, no no specific chronic inflammation, no H. pylori. On 03/13/2018, creatinine was wnl.  Potassium 4.2.  Wt Readings from Last 3 Encounters:  04/14/18 95 lb (43.1 kg)  02/09/18 99 lb 3.2 oz (45 kg)  01/19/18 96 lb 6.4 oz (43.7 kg)     Review of Systems Since she left the hospital, she followed a "clear liquid/bland" diet until yesterday. Bowel movements are small but denies diarrhea or further blood in the stools. Had a capsule test yesterday but has not seen a gastroenterologist yet. Denies fever, chills.  No abdominal pain. Appetite is getting back. Her breathing is gradually worse over the last 6 months. DOE worse.  Now she is short of breath even at rest   BP was a slightly elevated but it was rechecked: 136/58.  She is a slightly tachycardic, not far from baseline.  Past Medical History:  Diagnosis Date  . CVA (cerebral infarction) 06/1999  . Emphysema    FEV1 0.49, FEV1% 27 on 5/10  . Hyperlipidemia   . OSTEOPENIA 03/27/2009  . PUD (peptic ulcer disease)     s/p partial gastrectomy  1985.      Past Surgical History:  Procedure Laterality Date  . BREAST BIOPSY  80s   R breast   . PARTIAL GASTRECTOMY      s/p partial gastrectomy 1985.    . TUBAL LIGATION  1980s    Social History   Socioeconomic History  . Marital status: Single    Spouse name: Not on file  . Number of children: 2  . Years of education: Not on file  . Highest education level: Not on file  Occupational History  . Occupation: disability,previously worked in Therapist, art.    Employer: [employer]  Social Needs  . Financial resource strain: Not on file  . Food insecurity:    Worry: Not on file    Inability: Not on file  . Transportation needs:    Medical: Not on file    Non-medical: Not on file  Tobacco Use  . Smoking status: Former Smoker    Packs/day: 1.50    Years: 40.00    Pack years: 60.00    Types: Cigarettes    Last attempt to quit: 05/25/2005    Years since quitting: 12.8  . Smokeless tobacco: Never Used  . Tobacco comment: 1 ppd started at age 47  Substance and Sexual Activity  . Alcohol use: No    Alcohol/week: 0.0 standard drinks    Comment: rarely  . Drug use: No  . Sexual activity: Never  Lifestyle  . Physical activity:  Days per week: Not on file    Minutes per session: Not on file  . Stress: Not on file  Relationships  . Social connections:    Talks on phone: Not on file    Gets together: Not on file    Attends religious service: Not on file    Active member of club or organization: Not on file    Attends meetings of clubs or organizations: Not on file    Relationship status: Not on file  . Intimate partner violence:    Fear of current or ex partner: Not on file    Emotionally abused: Not on file    Physically abused: Not on file    Forced sexual activity: Not on file  Other Topics Concern  . Not on file  Social History Narrative   one living child, Blair Promise, lives in [place].    Single, lives by self      Allergies as of  04/14/2018      Reactions   Latex Other (See Comments)   Only Latex Tape causes Rash and abrasive       Medication List        Accurate as of 04/14/18 11:45 AM. Always use your most recent med list.          albuterol (2.5 MG/3ML) 0.083% nebulizer solution Commonly known as:  PROVENTIL Take 2.5 mg by nebulization every 6 (six) hours as needed for wheezing or shortness of breath.   albuterol 108 (90 Base) MCG/ACT inhaler Commonly known as:  PROVENTIL HFA;VENTOLIN HFA Inhale 1-2 puffs into the lungs every 4 (four) hours as needed for wheezing or shortness of breath.   CALCIUM-CARB 600 + D 600-125 MG-UNIT Tabs Generic drug:  Calcium Carbonate-Vitamin D Take 1 tablet by mouth 2 (two) times daily.   clopidogrel 75 MG tablet Commonly known as:  PLAVIX Take 1 tablet (75 mg total) by mouth daily.   denosumab 60 MG/ML Sosy injection Commonly known as:  PROLIA Inject 60 mg into the skin every 6 (six) months.   DULERA 100-5 MCG/ACT Aero Generic drug:  mometasone-formoterol INHALE 2 PUFFS BY MOUTH TWICE DAILY   escitalopram 10 MG tablet Commonly known as:  LEXAPRO Take 0.5 tablets (5 mg total) by mouth daily.   FLUTTER Devi Use as directed   pantoprazole 40 MG tablet Commonly known as:  PROTONIX Take 40 mg by mouth daily.   simvastatin 40 MG tablet Commonly known as:  ZOCOR Take 1 tablet (40 mg total) by mouth at bedtime.   SPIRIVA HANDIHALER 18 MCG inhalation capsule Generic drug:  tiotropium INHALE THE CONTENTS OF 1 CAPSULE VIA INHALATION DEVICE EVERY DAY   Vitamin D3 25 MCG (1000 UT) Caps Take 1 capsule by mouth daily.           Objective:   Physical Exam BP (!) 170/68 (BP Location: Right Arm, Patient Position: Sitting, Cuff Size: Normal)   Pulse (!) 128   Temp 98.9 F (37.2 C) (Oral)   Resp 20   Ht 5\' 2"  (1.575 m)   Wt 95 lb (43.1 kg)   SpO2 99%   BMI 17.38 kg/m   General:   Well developed, NAD, BMI noted.  Underweight appearing HEENT:    Normocephalic . Face symmetric, atraumatic Lungs:  Severely decreased breath sounds Mild distress (on oxygen) Heart: tachycardic;  no murmur.  no pretibial edema bilaterally  Abdomen:  Not distended, soft, non-tender. No rebound or rigidity.   Skin:  Not pale. Not jaundice Neurologic:  alert & oriented X3.  Speech normal, gait appropriate for age and unassisted Psych--  Cognition and judgment appear intact.  Cooperative with normal attention span and concentration.  Behavior appropriate. No anxious or depressed appearing.     Assessment & Plan:     Assessment COPD,Chronic respiratory failure ----->  O2 24/7. 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  CT consistent with proctitis, had a colonoscopy, 3 polyps with tubular adenomas.  Also had a EGD.  Was discharged on Protonix. No further symptoms, I noted mild weight loss. Had a follow-up capsule endoscopy yesterday.  Has not seen gastroenterologist yet. Plan: CBC, CMP, iron, ferritin.  Consider iron supplements. Chronic respiratory failure: Gradually worse, some of the recent  worsening likely  from anemia.  Has an appointment to see pulmonary next week. Weight loss: plan to trend weights. Elevated BP upon arrival: Recheck and is normal. Elevated vitamin D: Recommend to stop supplements, okay to continue calcium. RTC 3 months

## 2018-04-15 MED ORDER — IRON 325 (65 FE) MG PO TABS: 1.0000 | ORAL_TABLET | Freq: Three times a day (TID) | ORAL | 5 refills | Status: DC

## 2018-04-15 NOTE — Assessment & Plan Note (Signed)
Red blood per rectum,   proctitis, anemia: Recently admitted to the hospital, please see summary in the HPI.  CT consistent with proctitis, had a colonoscopy, 3 polyps with tubular adenomas.  Also had a EGD.  Was discharged on Protonix. No further symptoms, I noted mild weight loss. Had a follow-up capsule endoscopy yesterday.  Has not seen gastroenterologist yet. Plan: CBC, CMP, iron, ferritin.  Consider iron supplements. Chronic respiratory failure: Gradually worse, some of the recent  worsening likely  from anemia.  Has an appointment to see pulmonary next week. Weight loss: plan to trend weights. Elevated BP upon arrival: Recheck and is normal. Elevated vitamin D: Recommend to stop supplements, okay to continue calcium. RTC 3 months

## 2018-04-19 ENCOUNTER — Telehealth: Payer: Self-pay

## 2018-04-19 ENCOUNTER — Telehealth: Payer: Self-pay | Admitting: Internal Medicine

## 2018-04-19 NOTE — Telephone Encounter (Signed)
FYI

## 2018-04-19 NOTE — Telephone Encounter (Signed)
Ok,noted

## 2018-04-19 NOTE — Telephone Encounter (Signed)
PT orders received from [facility]- orders signed and faxed to [phone]. Form sent for scanning.

## 2018-04-19 NOTE — Telephone Encounter (Signed)
Copied from [place]. Topic: General - Other >> Apr 19, 2018 12:14 PM Keene Breath wrote: Reason for CRM: Roland with [facility] called to update the doctor on the patient's home health visits.  Patient has been declining since last week and yesterday was not feeling well so the PT was cancelled.  Patient stated that she would like to cancel visits during the holidays as well.  Please advise.  CB# [phone]

## 2018-04-20 ENCOUNTER — Telehealth: Payer: Self-pay | Admitting: *Deleted

## 2018-04-20 NOTE — Telephone Encounter (Signed)
Received Physician Orders from [facility]; forwarded to provider/SLS 11/27 

## 2018-04-26 ENCOUNTER — Telehealth: Payer: Self-pay | Admitting: Internal Medicine

## 2018-04-26 NOTE — Telephone Encounter (Signed)
Noted  

## 2018-04-26 NOTE — Telephone Encounter (Signed)
Copied from [place] [id number]. Topic: Quick Communication - See Telephone Encounter >> Apr 26, 2018  4:02 PM Rutherford Nail, NT wrote: CRM for notification. See Telephone encounter for: 04/26/18. Charise with [facility] calling and states that the patient refused a visit last week due to not feeling well and having diarrhea.  CB#: [phone]

## 2018-04-27 ENCOUNTER — Ambulatory Visit: Payer: Self-pay | Admitting: Pulmonary Disease

## 2018-04-28 ENCOUNTER — Encounter: Payer: Self-pay | Admitting: Pulmonary Disease

## 2018-04-28 ENCOUNTER — Ambulatory Visit (INDEPENDENT_AMBULATORY_CARE_PROVIDER_SITE_OTHER): Payer: Medicare Other | Admitting: Pulmonary Disease

## 2018-04-28 VITALS — BP 144/64 | HR 121 | Ht 63.78 in | Wt 96.2 lb

## 2018-04-28 DIAGNOSIS — J411 Mucopurulent chronic bronchitis: Secondary | ICD-10-CM

## 2018-04-28 DIAGNOSIS — J9612 Chronic respiratory failure with hypercapnia: Secondary | ICD-10-CM

## 2018-04-28 DIAGNOSIS — E440 Moderate protein-calorie malnutrition: Secondary | ICD-10-CM

## 2018-04-28 DIAGNOSIS — J9611 Chronic respiratory failure with hypoxia: Secondary | ICD-10-CM | POA: Diagnosis not present

## 2018-04-28 MED ORDER — BUDESONIDE-FORMOTEROL FUMARATE 160-4.5 MCG/ACT IN AERO: 2.0000 | INHALATION_SPRAY | Freq: Two times a day (BID) | RESPIRATORY_TRACT | 6 refills | Status: DC

## 2018-04-28 MED ORDER — SPACER/AERO-HOLDING CHAMBERS DEVI: 1.0000 | Freq: Once | 0 refills | Status: AC

## 2018-04-28 MED ORDER — IPRATROPIUM-ALBUTEROL 0.5-2.5 (3) MG/3ML IN SOLN: 3.0000 mL | Freq: Four times a day (QID) | RESPIRATORY_TRACT | 5 refills | Status: AC | PRN

## 2018-04-28 NOTE — Telephone Encounter (Signed)
Signed and faxed to [place] at [phone]. Form sent for scanning.

## 2018-04-28 NOTE — Patient Instructions (Signed)
Severe COPD with severe shortness of breath: Try taking Symbicort 2 puffs twice a day through a spacer no matter how you feel instead of Dulera.  If the sample we gave you is helpful then call us so that we can change your prescription. Continue Spiriva daily We will refill the DuoNeb to use on an as-needed basis for shortness of breath If the Symbicort is not helpful then we can change you to nebulized controller medicines: Perforomist and budesonide  Recent hospitalization for anemia and GI bleeding: Talk to your primary care doctor and other doctors about IV instead of oral iron  Chronic respiratory failure with hypoxemia: Continue using 3 to 4 L of oxygen as you are doing  Protein calorie malnutrition/deconditioning: I think you should try to exercise more: gradually increase walking to a goal of 10-15 minutes a day, increase sit to stand exercises to 5-6 in a row 3 times a day Try eating 60 to 80 g of protein a day  Follow up with me in 3 months or sooner if needed

## 2018-04-28 NOTE — Progress Notes (Signed)
Subjective:    Patient ID: Helen Scott, female    DOB: 03/04/1951, 67 y.o.   MRN: [id number]  Synopsis: Former [facility] patient with GOLD D COPD O2 dependent Transplant eval at [facility]--told too good, 2010 Hospitalized 2009 for COPD exacerbation and started on 2 L O2 As of 2016 has been using 3LPM O2 since 2015   HPI Chief Complaint  Patient presents with  . Follow-up    Recent hospital visit, increased shortness of breath   Unfortunately Zooey was hospitalized recently for lower GI bleeding.  She says that she had a colonoscopy which was within normal limits though there were some diverticuli and adenoma seen.  There is no clear source of bleeding.  Since then she has been discharged home and is taking oral iron supplements.  She has had some confusion.  She has shortness of breath but that seems to be near her baseline.  She does get significant tachycardia when she exerts herself which makes her anxious.  She has not had problems with bronchitis or worsening cough or chest tightness. Some confusion since then Taking an antiacid now  She says that her breathing has been "fine".  Though she notes significant malaise.  She does note increasing HR with exercise which scares her.  She feels.   Past Medical History:  Diagnosis Date  . CVA (cerebral infarction) 06/1999  . Emphysema    FEV1 0.49, FEV1% 27 on 5/10  . Hyperlipidemia   . OSTEOPENIA 03/27/2009  . PUD (peptic ulcer disease)     s/p partial gastrectomy 1985.        Review of Systems  Constitutional: Negative for chills, fatigue, fever and unexpected weight change.  HENT: Negative for postnasal drip, rhinorrhea and sinus pressure.   Respiratory: Positive for shortness of breath. Negative for cough and wheezing.   Cardiovascular: Positive for palpitations. Negative for chest pain and leg swelling.       Objective:   Physical Exam  Vitals:   04/28/18 1017  BP: (!) 144/64  Pulse: (!) 121  SpO2: 97%  Weight: 96 lb  3.2 oz (43.6 kg)  Height: 5' 3.78" (1.62 m)   3L O2  Gen: chronically ill appearing HENT: OP clear, TM's clear, neck supple PULM: Poor air movement B, normal percussion CV: RRR, no mgr, trace edema GI: BS+, soft, nontender Derm: no cyanosis or rash Psyche: normal mood and affect    Echocardiogram: August 2017 LVEF 55-60%, mild aortic  Cardiac stress test: August 2017 no evidence of ischemia   pfts 2010:  FEV1 0.49 (ratio 27%).  BMET    Component Value Date/Time   NA 143 04/14/2018 1224   K 4.1 04/14/2018 1224   CL 94 (L) 04/14/2018 1224   CO2 43 (H) 04/14/2018 1224   GLUCOSE 150 (H) 04/14/2018 1224   BUN 11 04/14/2018 1224   CREATININE 0.49 04/14/2018 1224   CREATININE 0.58 05/24/2017 1032   CALCIUM 9.0 04/14/2018 1224   GFRNONAA 93.77 02/21/2010 1027   GFRAA 111 02/27/2008 0858   Records from her most recent visit with her primary care physician reviewed where she was seen after hospital discharge in the setting of rectal bleeding.  She was continued on iron replacement.  She had been noted to have-tubular adenoma seen on a colonoscopy over 21st.     Assessment & Plan:   No diagnosis found.  Discussion: Though her COPD has been stable it remains very severe and end-stage.  So even a minor acute illness  we will set her back quite a bit and unfortunately I think that is what happened this time because she feels quite tachypneic and tachycardic when she walks.  She was recently found to be significantly anemic after the GI bleed.  I think she needs to try to exercise more.  She is interested in trying to switch her controller medicine: Specifically Dulera.  I think it is reasonable to try Symbicort as it has a faster onset of action though the best approach may be to switch her to nebulized medicines because her lung function is so poor.  Plan: Severe COPD with severe shortness of breath: Try taking Symbicort 2 puffs twice a day through a spacer no matter how you feel  instead of Dulera.  If the sample we gave you is helpful then call us so that we can change your prescription. Continue Spiriva daily We will refill the DuoNeb to use on an as-needed basis for shortness of breath If the Symbicort is not helpful then we can change you to nebulized controller medicines: Perforomist and budesonide  Recent hospitalization for anemia and GI bleeding: Talk to your primary care doctor and other doctors about IV instead of oral iron  Chronic respiratory failure with hypoxemia: Continue using 3 to 4 L of oxygen as you are doing  Protein calorie malnutrition/deconditioning: I think you should try to exercise more: gradually increase walking to a goal of 10-15 minutes a day, increase sit to stand exercises to 5-6 in a row 3 times a day Try eating 60 to 80 g of protein a day  Follow up with me in 3 months or sooner if needed  > 50% of this 25 min visit spent face to face   Current Outpatient Medications:  .  albuterol (PROVENTIL) (2.5 MG/3ML) 0.083% nebulizer solution, Take 2.5 mg by nebulization every 6 (six) hours as needed for wheezing or shortness of breath., Disp: , Rfl:  .  albuterol (VENTOLIN HFA) 108 (90 Base) MCG/ACT inhaler, Inhale 1-2 puffs into the lungs every 4 (four) hours as needed for wheezing or shortness of breath., Disp: 18 g, Rfl: 5 .  calcium citrate (CALCITRATE - DOSED IN MG ELEMENTAL CALCIUM) 950 MG tablet, Take 200 mg of elemental calcium by mouth daily., Disp: , Rfl:  .  clopidogrel (PLAVIX) 75 MG tablet, Take 1 tablet (75 mg total) by mouth daily., Disp: 90 tablet, Rfl: 2 .  denosumab (PROLIA) 60 MG/ML SOSY injection, Inject 60 mg into the skin every 6 (six) months., Disp: 1 Syringe, Rfl: 1 .  DULERA 100-5 MCG/ACT AERO, INHALE 2 PUFFS BY MOUTH TWICE DAILY, Disp: 39 g, Rfl: 3 .  escitalopram (LEXAPRO) 10 MG tablet, Take 0.5 tablets (5 mg total) by mouth daily., Disp: 45 tablet, Rfl: 2 .  Ferrous Sulfate (IRON) 325 (65 Fe) MG TABS, Take 1  tablet (325 mg total) by mouth 3 (three) times daily with meals., Disp: 90 tablet, Rfl: 5 .  pantoprazole (PROTONIX) 40 MG tablet, Take 40 mg by mouth daily., Disp: , Rfl:  .  Respiratory Therapy Supplies (FLUTTER) DEVI, Use as directed, Disp: 1 each, Rfl: 0 .  simvastatin (ZOCOR) 40 MG tablet, Take 1 tablet (40 mg total) by mouth at bedtime., Disp: 90 tablet, Rfl: 2 .  SPIRIVA HANDIHALER 18 MCG inhalation capsule, INHALE THE CONTENTS OF 1 CAPSULE VIA INHALATION DEVICE EVERY DAY, Disp: 90 capsule, Rfl: 1

## 2018-05-01 ENCOUNTER — Other Ambulatory Visit: Payer: Self-pay | Admitting: Pulmonary Disease

## 2018-05-02 NOTE — Telephone Encounter (Signed)
Dr. Lake Bells,     I am going to refill the patient's spirivia but she is also requesting a refill on the daily azithromycin. It was not mentioned and not on her medication list for her last OV (04/28/18)  Do you want me to refill this?  Thank you,  Burman Nieves

## 2018-05-04 ENCOUNTER — Other Ambulatory Visit: Payer: Medicare Other

## 2018-05-05 ENCOUNTER — Telehealth: Payer: Self-pay | Admitting: Pulmonary Disease

## 2018-05-05 ENCOUNTER — Telehealth: Payer: Self-pay

## 2018-05-05 MED ORDER — TIOTROPIUM BROMIDE MONOHYDRATE 18 MCG IN CAPS: ORAL_CAPSULE | RESPIRATORY_TRACT | 5 refills | Status: DC

## 2018-05-05 MED ORDER — AZITHROMYCIN 250 MG PO TABS: ORAL_TABLET | ORAL | 0 refills | Status: DC

## 2018-05-05 NOTE — Telephone Encounter (Signed)
Spoke with Legrand Como and gave him dx code of 496 for COPD for pt's nebulizer. Nothing further is needed.

## 2018-05-05 NOTE — Telephone Encounter (Signed)
Called and spoke with patient, advised that refill has been sent in. Nothing further needed.

## 2018-05-05 NOTE — Telephone Encounter (Signed)
Copied from [place] [id number]. Topic: General - Other >> May 05, 2018 10:46 AM Leward Quan A wrote: Reason for CRM: Patient called would like to speak with Damita Dunnings regarding different phone calls that she have received to be seen in [facility] and also to go for physical therapy which she already had. She just want some clarification, please advise. Ph# [phone]

## 2018-05-06 NOTE — Telephone Encounter (Signed)
Spoke w/ Pt- she informed she has been getting calls from a PT office in [place]- she has completed PT w/ home health for the last 60 days. I reviewed chart and told her I wasn't sure- instructed if she keeps getting calls from them have them call us. She is to come back on Monday to recheck her CBC. She was instructed by GYN to drop iron to twice daily instead of tid.

## 2018-05-09 ENCOUNTER — Other Ambulatory Visit (INDEPENDENT_AMBULATORY_CARE_PROVIDER_SITE_OTHER): Payer: Medicare Other

## 2018-05-09 DIAGNOSIS — D649 Anemia, unspecified: Secondary | ICD-10-CM | POA: Diagnosis not present

## 2018-05-09 LAB — CBC WITH DIFFERENTIAL/PLATELET
Basophils Absolute: 0 10*3/uL (ref 0.0–0.1)
Basophils Relative: 0.6 % (ref 0.0–3.0)
Eosinophils Absolute: 0 10*3/uL (ref 0.0–0.7)
Eosinophils Relative: 0.6 % (ref 0.0–5.0)
HCT: 31.8 % — ABNORMAL LOW (ref 36.0–46.0)
HEMOGLOBIN: 9.9 g/dL — AB (ref 12.0–15.0)
Lymphocytes Relative: 19 % (ref 12.0–46.0)
Lymphs Abs: 0.9 10*3/uL (ref 0.7–4.0)
MCHC: 31.1 g/dL (ref 30.0–36.0)
MCV: 86.9 fl (ref 78.0–100.0)
Monocytes Absolute: 0.3 10*3/uL (ref 0.1–1.0)
Monocytes Relative: 7.2 % (ref 3.0–12.0)
Neutro Abs: 3.4 10*3/uL (ref 1.4–7.7)
Neutrophils Relative %: 72.6 % (ref 43.0–77.0)
Platelets: 242 10*3/uL (ref 150.0–400.0)
RBC: 3.66 Mil/uL — ABNORMAL LOW (ref 3.87–5.11)
RDW: 20.7 % — ABNORMAL HIGH (ref 11.5–15.5)
WBC: 4.7 10*3/uL (ref 4.0–10.5)

## 2018-05-10 DIAGNOSIS — Z1231 Encounter for screening mammogram for malignant neoplasm of breast: Secondary | ICD-10-CM | POA: Diagnosis not present

## 2018-05-10 LAB — HM MAMMOGRAPHY

## 2018-05-10 NOTE — Addendum Note (Signed)
Addended byDamita Dunnings D on: 05/10/2018 01:53 PM   Modules accepted: Orders

## 2018-05-11 ENCOUNTER — Telehealth: Payer: Self-pay | Admitting: *Deleted

## 2018-05-11 NOTE — Telephone Encounter (Signed)
Received Mammogram results from [place]; forwarded to provider/SLS 12/18

## 2018-05-12 ENCOUNTER — Telehealth: Payer: Self-pay

## 2018-05-12 ENCOUNTER — Encounter: Payer: Self-pay | Admitting: Internal Medicine

## 2018-05-12 NOTE — Telephone Encounter (Signed)
Results abstracted and sent for scanning.

## 2018-05-12 NOTE — Telephone Encounter (Signed)
Plan of care received from [place]. Form signed and faxed back to [phone]. Form sent for scanning.

## 2018-05-14 DIAGNOSIS — J449 Chronic obstructive pulmonary disease, unspecified: Secondary | ICD-10-CM | POA: Diagnosis not present

## 2018-05-17 ENCOUNTER — Telehealth: Payer: Self-pay

## 2018-05-17 NOTE — Telephone Encounter (Signed)
Plan of care received from [place]. Form signed and faxed to [phone]. Form sent for scanning.

## 2018-05-23 ENCOUNTER — Encounter: Payer: Self-pay | Admitting: Internal Medicine

## 2018-05-30 ENCOUNTER — Ambulatory Visit (INDEPENDENT_AMBULATORY_CARE_PROVIDER_SITE_OTHER): Payer: Medicare Other | Admitting: Internal Medicine

## 2018-05-30 ENCOUNTER — Encounter: Payer: Self-pay | Admitting: Internal Medicine

## 2018-05-30 VITALS — BP 132/68 | HR 110 | Temp 98.1°F | Resp 16 | Ht 63.78 in | Wt 95.5 lb

## 2018-05-30 DIAGNOSIS — J9612 Chronic respiratory failure with hypercapnia: Secondary | ICD-10-CM

## 2018-05-30 DIAGNOSIS — D649 Anemia, unspecified: Secondary | ICD-10-CM | POA: Diagnosis not present

## 2018-05-30 DIAGNOSIS — T452X4D Poisoning by vitamins, undetermined, subsequent encounter: Secondary | ICD-10-CM | POA: Diagnosis not present

## 2018-05-30 DIAGNOSIS — J411 Mucopurulent chronic bronchitis: Secondary | ICD-10-CM

## 2018-05-30 DIAGNOSIS — M818 Other osteoporosis without current pathological fracture: Secondary | ICD-10-CM | POA: Diagnosis not present

## 2018-05-30 DIAGNOSIS — J9611 Chronic respiratory failure with hypoxia: Secondary | ICD-10-CM

## 2018-05-30 DIAGNOSIS — Z0000 Encounter for general adult medical examination without abnormal findings: Secondary | ICD-10-CM

## 2018-05-30 LAB — CBC WITH DIFFERENTIAL/PLATELET
BASOS PCT: 0.6 % (ref 0.0–3.0)
Basophils Absolute: 0 10*3/uL (ref 0.0–0.1)
Eosinophils Absolute: 0 10*3/uL (ref 0.0–0.7)
Eosinophils Relative: 1.2 % (ref 0.0–5.0)
HCT: 34.5 % — ABNORMAL LOW (ref 36.0–46.0)
Hemoglobin: 10.8 g/dL — ABNORMAL LOW (ref 12.0–15.0)
Lymphocytes Relative: 20.4 % (ref 12.0–46.0)
Lymphs Abs: 0.8 10*3/uL (ref 0.7–4.0)
MCHC: 31.2 g/dL (ref 30.0–36.0)
MCV: 86.9 fl (ref 78.0–100.0)
Monocytes Absolute: 0.3 10*3/uL (ref 0.1–1.0)
Monocytes Relative: 7.4 % (ref 3.0–12.0)
Neutro Abs: 2.7 10*3/uL (ref 1.4–7.7)
Neutrophils Relative %: 70.4 % (ref 43.0–77.0)
Platelets: 156 10*3/uL (ref 150.0–400.0)
RBC: 3.98 Mil/uL (ref 3.87–5.11)
RDW: 19.1 % — ABNORMAL HIGH (ref 11.5–15.5)
WBC: 3.8 10*3/uL — ABNORMAL LOW (ref 4.0–10.5)

## 2018-05-30 LAB — LIPID PANEL
Cholesterol: 196 mg/dL (ref 0–200)
HDL: 67.9 mg/dL (ref >39.00)
LDL Cholesterol: 119 mg/dL — ABNORMAL HIGH (ref 0–99)
NonHDL: 128.38
Total CHOL/HDL Ratio: 3
Triglycerides: 48 mg/dL (ref 0.0–149.0)
VLDL: 9.6 mg/dL (ref 0.0–40.0)

## 2018-05-30 LAB — IRON: Iron: 44 ug/dL (ref 42–145)

## 2018-05-30 LAB — TSH: TSH: 1.39 u[IU]/mL (ref 0.35–4.50)

## 2018-05-30 LAB — FERRITIN: Ferritin: 15.5 ng/mL (ref 10.0–291.0)

## 2018-05-30 MED ORDER — ZOSTER VAC RECOMB ADJUVANTED 50 MCG/0.5ML IM SUSR: 0.5000 mL | Freq: Once | INTRAMUSCULAR | 1 refills | Status: AC

## 2018-05-30 NOTE — Progress Notes (Signed)
Subjective:    Patient ID: Helen Scott, female    DOB: 05-Mar-1951, 68 y.o.   MRN: [id number]  DOS:  05/30/2018 Type of visit - description: cpx  Since the last visit, saw pulmonary, they noted COPD remains severe and end-stage. He is suggested possibly IV iron to help anemia. Also since the last visit had a capsule endoscopy and was told it was negative.   Wt Readings from Last 3 Encounters:  05/30/18 95 lb 8 oz (43.3 kg)  04/28/18 96 lb 3.2 oz (43.6 kg)  04/14/18 95 lb (43.1 kg)     Review of Systems No new symptoms, shortness of breath this is slightly worse than baseline but gradually so. Occasional difficulty swallowing. Continue with easy bruising No more problems with red blood per rectum.  Other than above, a 14 point review of systems is negative    Past Medical History:  Diagnosis Date  . CVA (cerebral infarction) 06/1999  . Emphysema    FEV1 0.49, FEV1% 27 on 5/10  . Hyperlipidemia   . OSTEOPENIA 03/27/2009  . PUD (peptic ulcer disease)     s/p partial gastrectomy 1985.      Past Surgical History:  Procedure Laterality Date  . BREAST BIOPSY  80s   R breast   . PARTIAL GASTRECTOMY      s/p partial gastrectomy 1985.    . TUBAL LIGATION  1980s    Social History   Socioeconomic History  . Marital status: Single    Spouse name: Not on file  . Number of children: 2  . Years of education: Not on file  . Highest education level: Not on file  Occupational History  . Occupation: disability,previously worked in Therapist, art.    Employer: [employer]  Social Needs  . Financial resource strain: Not on file  . Food insecurity:    Worry: Not on file    Inability: Not on file  . Transportation needs:    Medical: Not on file    Non-medical: Not on file  Tobacco Use  . Smoking status: Former Smoker    Packs/day: 1.50    Years: 40.00    Pack years: 60.00    Types: Cigarettes    Last attempt to quit: 05/25/2005    Years since quitting: 13.0  .  Smokeless tobacco: Never Used  . Tobacco comment: 1 ppd started at age 33  Substance and Sexual Activity  . Alcohol use: No    Alcohol/week: 0.0 standard drinks    Comment: rarely  . Drug use: No  . Sexual activity: Never  Lifestyle  . Physical activity:    Days per week: Not on file    Minutes per session: Not on file  . Stress: Not on file  Relationships  . Social connections:    Talks on phone: Not on file    Gets together: Not on file    Attends religious service: Not on file    Active member of club or organization: Not on file    Attends meetings of clubs or organizations: Not on file    Relationship status: Not on file  . Intimate partner violence:    Fear of current or ex partner: Not on file    Emotionally abused: Not on file    Physically abused: Not on file    Forced sexual activity: Not on file  Other Topics Concern  . Not on file  Social History Narrative   one living child, Blair Promise, lives  in [place] one child.    Single, lives by self, drives; ADLs getting limited      Family History  Problem Relation Age of Onset  . Diabetes Father 46  . Heart disease Father        MI at age 65s  . Prostate cancer Father        dx in his 26s  . Breast cancer Maternal Aunt        aunt, great aunt  and 2 first cousins  . Hypertension Neg Hx   . Stroke Neg Hx   . Colon cancer Neg Hx      Allergies as of 05/30/2018      Reactions   Latex Other (See Comments)   Only Latex Tape causes Rash and abrasive       Medication List       Accurate as of May 30, 2018  8:36 PM. Always use your most recent med list.        albuterol (2.5 MG/3ML) 0.083% nebulizer solution Commonly known as:  PROVENTIL Take 2.5 mg by nebulization every 6 (six) hours as needed for wheezing or shortness of breath.   albuterol 108 (90 Base) MCG/ACT inhaler Commonly known as:  VENTOLIN HFA Inhale 1-2 puffs into the lungs every 4 (four) hours as needed for wheezing or shortness of  breath.   azithromycin 250 MG tablet Commonly known as:  ZITHROMAX TAKE 1 TABLET(250 MG) BY MOUTH DAILY   budesonide-formoterol 160-4.5 MCG/ACT inhaler Commonly known as:  SYMBICORT Inhale 2 puffs into the lungs 2 (two) times daily.   calcium citrate 950 MG tablet Commonly known as:  CALCITRATE - dosed in mg elemental calcium Take 200 mg of elemental calcium by mouth daily.   clopidogrel 75 MG tablet Commonly known as:  PLAVIX Take 1 tablet (75 mg total) by mouth daily.   denosumab 60 MG/ML Sosy injection Commonly known as:  PROLIA Inject 60 mg into the skin every 6 (six) months.   escitalopram 10 MG tablet Commonly known as:  LEXAPRO Take 0.5 tablets (5 mg total) by mouth daily.   FLUTTER Devi Use as directed   ipratropium-albuterol 0.5-2.5 (3) MG/3ML Soln Commonly known as:  DUONEB Take 3 mLs by nebulization every 6 (six) hours as needed.   Iron 325 (65 Fe) MG Tabs Take 1 tablet (325 mg total) by mouth 3 (three) times daily with meals.   pantoprazole 40 MG tablet Commonly known as:  PROTONIX Take 40 mg by mouth daily.   simvastatin 40 MG tablet Commonly known as:  ZOCOR Take 1 tablet (40 mg total) by mouth at bedtime.   tiotropium 18 MCG inhalation capsule Commonly known as:  SPIRIVA HANDIHALER INHALE CONTENTS OF 1 CAPSULE ONCE DAILY USING HANDIHALER   Zoster Vaccine Adjuvanted injection Commonly known as:  SHINGRIX Inject 0.5 mLs into the muscle once for 1 dose.           Objective:   Physical Exam BP 132/68 (BP Location: Left Arm, Patient Position: Sitting, Cuff Size: Small)   Pulse (!) 110   Temp 98.1 F (36.7 C) (Oral)   Resp 16   Ht 5' 3.78" (1.62 m)   Wt 95 lb 8 oz (43.3 kg)   SpO2 98%   BMI 16.51 kg/m  General: Well developed, NAD, BMI noted HEENT:  Normocephalic . Face symmetric, atraumatic Lungs:  Significantly decreased breath sounds Normal respiratory effort, no intercostal retractions, no accessory muscle use. Heart: Distant  heart  sounds, seems regular.    No pretibial edema bilaterally  Abdomen:  Not distended, soft, non-tender. No rebound or rigidity.   Skin: Exposed areas without rash. Not pale. Not jaundice Neurologic:  alert & oriented X3.  Speech normal, gait not tested Strength symmetric and appropriate for age.  Psych: Cognition and judgment appear intact.  Cooperative with normal attention span and concentration.  Behavior appropriate. No anxious or depressed appearing.     Assessment      Assessment COPD,Chronic respiratory failure ----->  O2 24/7. 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 Was also told no routine GI follow-up was needed. Stool is a still dark/black but nonbloody ever since she left the hospital, on iron. We will check CBC, iron, ferritin.  Consider IV iron as suggested by pulmonology Hyperlipidemia: Continue simvastatin, check a FLP Depression: On Lexapro, PHQ 912 (moderate depression), on lexapro , no s/i, declined increase SSRI, despite high PHQ she feels ok.  Osteoporosis: On Prolia, last vitamin D was a slightly elevated, recheck today, currently on no vit d. Rec ca 1 gr a day  Patient reports she has not been able to schedule bone density test due to Medicare rules, will continue with Prolia for now, reassess next year Chronic respiratory failure: According to the patient, she is gradually getting worse.  Today she seems stable Weight loss: It has a stabilized, checking a TSH RTC 6 month

## 2018-05-30 NOTE — Progress Notes (Signed)
Pre visit review using our clinic review tool, if applicable. No additional management support is needed unless otherwise documented below in the visit note. 

## 2018-05-30 NOTE — Assessment & Plan Note (Addendum)
-  Td 2008, declined booster  ; pneumonia 23:  01-2009, 2017,  zostavax 2011, prevnar 2015, had a  flu shot; shingrix printed    -Female care :  per gyn, had a Pap ~ 2014,   was abnormal, status post a biopsy (was told ok), saw gyn @ [facility] 06/2017, no PAP done per pt, was rx RTC 1 year, plans to f/u w/ them [facility] 04/2018 told wnl  -CCS:  cscope   done @ [facility]  9- 2010, neg; cscope 02-2018 in the context of an admission to the hospital.  Had red blood per rectum, DX proctitis, 3 tubular adenomas.   -Labs: FLP, CBC, iron, ferritin, vitamin D, TSH

## 2018-05-30 NOTE — Assessment & Plan Note (Signed)
  Red blood per rectum, proctitis, anemia: Since her last office visit, reportedly had a capsule endoscopy and she was told it was negative. Was also told no routine GI follow-up was needed. Stool is a still dark/black but nonbloody ever since she left the hospital, on iron. We will check CBC, iron, ferritin.  Consider IV iron as suggested by pulmonology Hyperlipidemia: Continue simvastatin, check a FLP Depression: On Lexapro, PHQ 912 (moderate depression), on lexapro , no s/i, declined increase SSRI, despite high PHQ she feels ok.  Osteoporosis: On Prolia, last vitamin D was a slightly elevated, recheck today, currently on no vit d. Rec ca 1 gr a day  Patient reports she has not been able to schedule bone density test due to Medicare rules, will continue with Prolia for now, reassess next year Chronic respiratory failure: According to the patient, she is gradually getting worse.  Today she seems stable Weight loss: It has a stabilized, checking a TSH RTC 6 month

## 2018-05-30 NOTE — Patient Instructions (Addendum)
Please schedule Medicare Wellness with Angel.   GO TO THE LAB : Get the blood work     GO TO THE FRONT DESK Schedule your next appointment   for a checkup in 6 months    

## 2018-06-01 ENCOUNTER — Telehealth: Payer: Self-pay

## 2018-06-01 ENCOUNTER — Other Ambulatory Visit: Payer: Self-pay | Admitting: Internal Medicine

## 2018-06-01 NOTE — Telephone Encounter (Signed)
Copied from [place] [phone]. Topic: General - Other >> Jun 01, 2018 12:44 PM Windy Kalata wrote: Reason for CRM: Patient is returning a call to [facility], tried to reach office and no answer  Best call back is [phone]

## 2018-06-01 NOTE — Telephone Encounter (Signed)
Prolia-- awaiting insurance benefits.

## 2018-06-02 ENCOUNTER — Other Ambulatory Visit: Payer: Self-pay

## 2018-06-02 LAB — VITAMIN D 1,25 DIHYDROXY
Vitamin D 1, 25 (OH)2 Total: 57 pg/mL (ref 18–72)
Vitamin D3 1, 25 (OH)2: 57 pg/mL

## 2018-06-02 NOTE — Telephone Encounter (Signed)
Spoke w/ Pt- she needed to schedule 6 month follow-up, appt scheduled 12/01/2018. Also, had questions about med list- she was instructed by Dr. Larose Kells to take 1g daily of calcium- informed Pt that this is equivalent to 1000mg . Also updated iron to bid instead of tid. She also informed it is almost time of Prolia- informed her that I was actually working on this for PCP's patients that are due in January/February. Informed I would have more information once insurance benefits have been received. Pt verbalized understanding.

## 2018-06-02 NOTE — Telephone Encounter (Signed)
Will reach out later today.

## 2018-06-06 ENCOUNTER — Telehealth: Payer: Self-pay | Admitting: Pulmonary Disease

## 2018-06-06 MED ORDER — DENOSUMAB 60 MG/ML ~~LOC~~ SOSY: 60.0000 mg | PREFILLED_SYRINGE | SUBCUTANEOUS | 1 refills | Status: DC

## 2018-06-06 MED ORDER — BUDESONIDE-FORMOTEROL FUMARATE 160-4.5 MCG/ACT IN AERO: 2.0000 | INHALATION_SPRAY | Freq: Two times a day (BID) | RESPIRATORY_TRACT | 6 refills | Status: DC

## 2018-06-06 NOTE — Telephone Encounter (Signed)
Called and spoke to pt. Pt is requesting Rx for Symbicort 160, as she felt this medication was effective.  Pt was provided with sample of Symbicort 160 at 04/28/18 OV. Ruthe Mannan has been removed from pt's med list.  Rx for Symbicort 160 has been sent to preferred pharmacy. Nothing further is needed.

## 2018-06-06 NOTE — Telephone Encounter (Signed)
Prolia benefits received PA not required if physician purchase 20% Prolia  ** patients copay assistance was completed and faxed on 10/05/17   Patient may owe approximately $220 OOP  Patient due after 06/17/2018.  Letter mailed to inform patient of benefits and to schedule.  Also, Prolia sent to [facility], Pt informed. She will call back to schedule visit.

## 2018-06-14 DIAGNOSIS — J449 Chronic obstructive pulmonary disease, unspecified: Secondary | ICD-10-CM | POA: Diagnosis not present

## 2018-06-14 NOTE — Telephone Encounter (Signed)
Pt scheduled 06/16/2018 for Prolia. She supplies her own medication from pharmacy.

## 2018-06-16 ENCOUNTER — Ambulatory Visit (INDEPENDENT_AMBULATORY_CARE_PROVIDER_SITE_OTHER): Payer: Medicare Other

## 2018-06-16 DIAGNOSIS — M810 Age-related osteoporosis without current pathological fracture: Secondary | ICD-10-CM

## 2018-06-16 MED ORDER — DENOSUMAB 60 MG/ML ~~LOC~~ SOSY
60.0000 mg | PREFILLED_SYRINGE | Freq: Once | SUBCUTANEOUS | Status: AC
  Administered 2018-06-16: 60 mg via SUBCUTANEOUS

## 2018-06-16 NOTE — Progress Notes (Signed)
Patient here today for prolia injectio administration. Patient provided the medication. Medication administered subq without any incidents. Patient left office in satisfactory conditions.

## 2018-07-04 ENCOUNTER — Other Ambulatory Visit: Payer: Self-pay | Admitting: Internal Medicine

## 2018-07-11 ENCOUNTER — Other Ambulatory Visit: Payer: Self-pay

## 2018-07-11 MED ORDER — AZITHROMYCIN 250 MG PO TABS: ORAL_TABLET | ORAL | 2 refills | Status: DC

## 2018-07-15 DIAGNOSIS — J449 Chronic obstructive pulmonary disease, unspecified: Secondary | ICD-10-CM | POA: Diagnosis not present

## 2018-07-20 ENCOUNTER — Ambulatory Visit: Payer: Self-pay | Admitting: Internal Medicine

## 2018-07-26 ENCOUNTER — Ambulatory Visit: Payer: Self-pay | Admitting: Pulmonary Disease

## 2018-08-02 ENCOUNTER — Ambulatory Visit: Payer: Self-pay | Admitting: Pulmonary Disease

## 2018-08-02 ENCOUNTER — Ambulatory Visit (INDEPENDENT_AMBULATORY_CARE_PROVIDER_SITE_OTHER): Payer: Medicare Other | Admitting: Pulmonary Disease

## 2018-08-02 ENCOUNTER — Encounter: Payer: Self-pay | Admitting: Pulmonary Disease

## 2018-08-02 VITALS — BP 124/60 | HR 63 | Ht 63.0 in | Wt 97.0 lb

## 2018-08-02 DIAGNOSIS — J411 Mucopurulent chronic bronchitis: Secondary | ICD-10-CM | POA: Diagnosis not present

## 2018-08-02 DIAGNOSIS — E440 Moderate protein-calorie malnutrition: Secondary | ICD-10-CM

## 2018-08-02 DIAGNOSIS — J9611 Chronic respiratory failure with hypoxia: Secondary | ICD-10-CM | POA: Diagnosis not present

## 2018-08-02 DIAGNOSIS — J432 Centrilobular emphysema: Secondary | ICD-10-CM | POA: Diagnosis not present

## 2018-08-02 DIAGNOSIS — J9612 Chronic respiratory failure with hypercapnia: Secondary | ICD-10-CM

## 2018-08-02 NOTE — Patient Instructions (Signed)
Severe COPD: Continue Symbicort 2 puffs twice a day Continue Spiriva daily Practice good hand hygiene Stay physically active Continue daily azithromycin  Allergic rhinitis Take generic zyrtec  Chronic respiratory failure with hypoxemia: Keep using oxygen as you are doing  We will plan on seeing you back in 3 to 4 months or sooner if needed

## 2018-08-02 NOTE — Progress Notes (Signed)
Subjective:    Patient ID: Helen Scott, female    DOB: 20-Sep-1950, 68 y.o.   MRN: [id number]  Synopsis: Former [facility] patient with GOLD D COPD O2 dependent Transplant eval at [facility]--told too good, 2010 Hospitalized 2009 for COPD exacerbation and started on 2 L O2 As of 2016 has been using 3LPM O2 since 2015   HPI Chief Complaint  Patient presents with  . Follow-up    some good days some bad days, runny nose,    Helen has post nasal drip and scratchy throat.  She is not taking anything fo rit right now.  She doesn't like taking steroid sprays.  She has taken allergy pills right now.  She lost 1 pound, but her appetite has been OK and she is eating a lot.  She says that it takes a lot out of her to cook, makes her lose her appetitie if she prepres.    Past Medical History:  Diagnosis Date  . CVA (cerebral infarction) 06/1999  . Emphysema    FEV1 0.49, FEV1% 27 on 5/10  . Hyperlipidemia   . OSTEOPENIA 03/27/2009  . PUD (peptic ulcer disease)     s/p partial gastrectomy 1985.        Review of Systems  Constitutional: Negative for chills, fatigue, fever and unexpected weight change.  HENT: Negative for postnasal drip, rhinorrhea and sinus pressure.   Respiratory: Positive for shortness of breath. Negative for cough and wheezing.   Cardiovascular: Positive for palpitations. Negative for chest pain and leg swelling.       Objective:   Physical Exam  Vitals:   08/02/18 1358  BP: 124/60  Pulse: 63  SpO2: 97%  Weight: 97 lb (44 kg)  Height: 5\' 3"  (1.6 m)   3L O2  Gen: chronically ill appearing HENT: OP clear, TM's clear, neck supple PULM: Poor air movement B, normal percussion CV: RRR, no mgr, trace edema GI: BS+, soft, nontender Derm: no cyanosis or rash Psyche: normal mood and affect   Echocardiogram: August 2017 LVEF 55-60%, mild aortic  Cardiac stress test: August 2017 no evidence of ischemia   pfts 2010:  FEV1 0.49 (ratio 27%).  BMET      Component Value Date/Time   NA 143 04/14/2018 1224   K 4.1 04/14/2018 1224   CL 94 (L) 04/14/2018 1224   CO2 43 (H) 04/14/2018 1224   GLUCOSE 150 (H) 04/14/2018 1224   BUN 11 04/14/2018 1224   CREATININE 0.49 04/14/2018 1224   CREATININE 0.58 05/24/2017 1032   CALCIUM 9.0 04/14/2018 1224   GFRNONAA 93.77 02/21/2010 1027   GFRAA 111 02/27/2008 0858   Records from her most recent visit with her primary care physician reviewed where she was seen after hospital discharge in the setting of rectal bleeding.  She was continued on iron replacement.  She had been noted to have-tubular adenoma seen on a colonoscopy over 21st.     Assessment & Plan:   Mucopurulent chronic bronchitis ([place])  Chronic respiratory failure with hypoxia and hypercapnia (H[facility])  Moderate protein-calorie malnutrition ([facility])  Centrilobular emphysema ([place])  Discussion: This has been a stable interval for Scott, she has not had an exacerbation since the last visit.  She is compliant with her medications.  She remains high risk, end-stage COPD.  Plan: Severe COPD: Continue Symbicort 2 puffs twice a day Continue Spiriva daily Practice good hand hygiene Stay physically active Continue daily azithromycin  Allergic rhinitis Take generic zyrtec  Chronic respiratory failure with  hypoxemia: Keep using oxygen as you are doing  We will plan on seeing you back in 3 to 4 months or sooner if needed   Current Outpatient Medications:  .  albuterol (PROVENTIL HFA;VENTOLIN HFA) 108 (90 Base) MCG/ACT inhaler, Inhale 1-2 puffs into the lungs every 4 (four) hours as needed for wheezing or shortness of breath., Disp: 18 g, Rfl: 5 .  azithromycin (ZITHROMAX) 250 MG tablet, TAKE 1 TABLET(250 MG) BY MOUTH DAILY, Disp: 30 tablet, Rfl: 2 .  budesonide-formoterol (SYMBICORT) 160-4.5 MCG/ACT inhaler, Inhale 2 puffs into the lungs 2 (two) times daily., Disp: 1 Inhaler, Rfl: 6 .  clopidogrel (PLAVIX) 75 MG tablet, Take 1 tablet (75 mg  total) by mouth daily., Disp: 90 tablet, Rfl: 3 .  Coral Calcium 1000 (390 Ca) MG TABS, Take 1000mg  daily, Disp: , Rfl:  .  denosumab (PROLIA) 60 MG/ML SOSY injection, Inject 60 mg into the skin every 6 (six) months., Disp: 1 Syringe, Rfl: 1 .  escitalopram (LEXAPRO) 10 MG tablet, Take 0.5 tablets (5 mg total) by mouth daily., Disp: 45 tablet, Rfl: 2 .  Ferrous Sulfate (IRON) 325 (65 Fe) MG TABS, Take 1 tablet (325 mg total) by mouth 2 (two) times daily with a meal., Disp: , Rfl:  .  ipratropium-albuterol (DUONEB) 0.5-2.5 (3) MG/3ML SOLN, Take 3 mLs by nebulization every 6 (six) hours as needed., Disp: 360 mL, Rfl: 5 .  pantoprazole (PROTONIX) 40 MG tablet, Take 40 mg by mouth daily., Disp: , Rfl:  .  Respiratory Therapy Supplies (FLUTTER) DEVI, Use as directed, Disp: 1 each, Rfl: 0 .  simvastatin (ZOCOR) 40 MG tablet, Take 1 tablet (40 mg total) by mouth at bedtime., Disp: 90 tablet, Rfl: 2 .  tiotropium (SPIRIVA HANDIHALER) 18 MCG inhalation capsule, INHALE CONTENTS OF 1 CAPSULE ONCE DAILY USING HANDIHALER, Disp: 150 capsule, Rfl: 5

## 2018-08-13 DIAGNOSIS — J449 Chronic obstructive pulmonary disease, unspecified: Secondary | ICD-10-CM | POA: Diagnosis not present

## 2018-09-13 DIAGNOSIS — J449 Chronic obstructive pulmonary disease, unspecified: Secondary | ICD-10-CM | POA: Diagnosis not present

## 2018-09-14 IMAGING — DX DG CHEST 2V
2 series · 2 of 2 positions shown · non-contrast
Comparison: 04/23/2015

CLINICAL DATA: Cough, dyspnea and congestion. Chest pain for 4
days. Former smoker. COPD.

EXAM:
CHEST  2 VIEW

[chest pa]
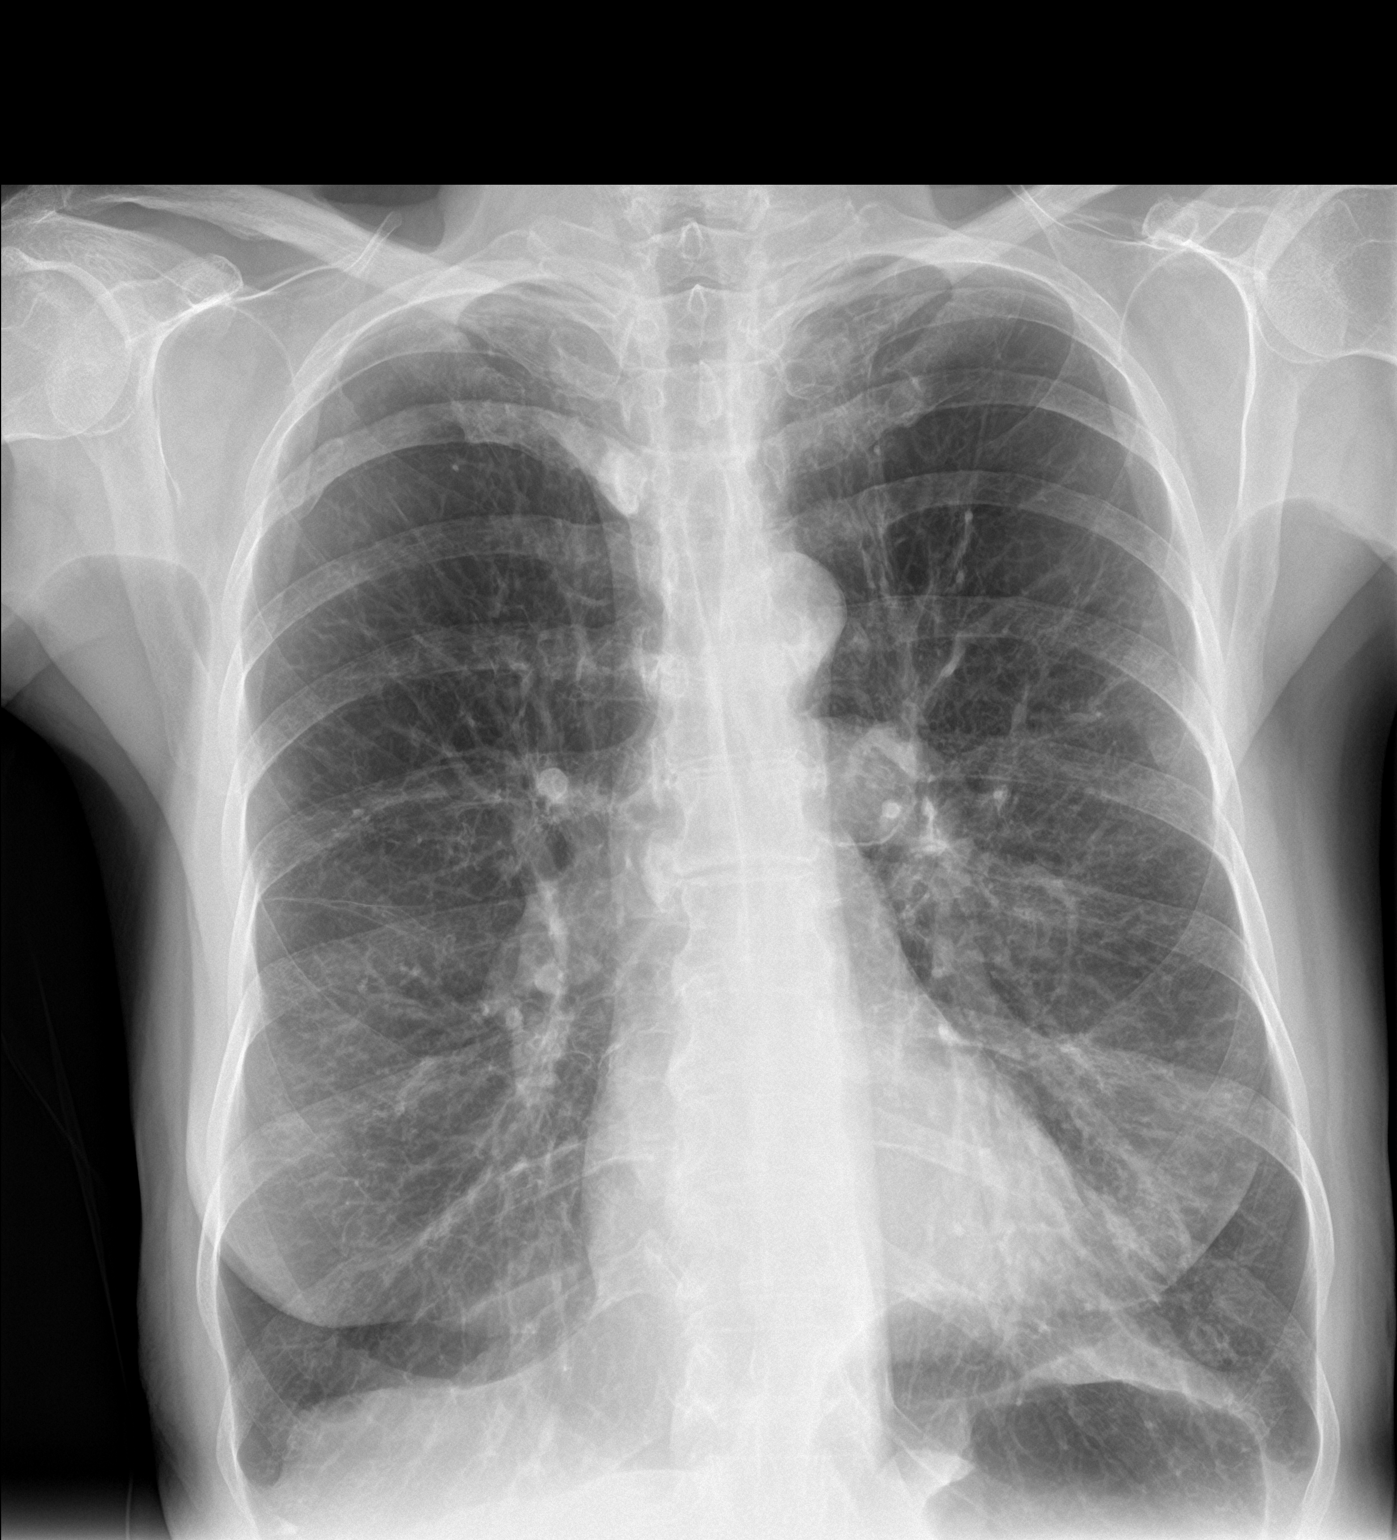

[chest lat]
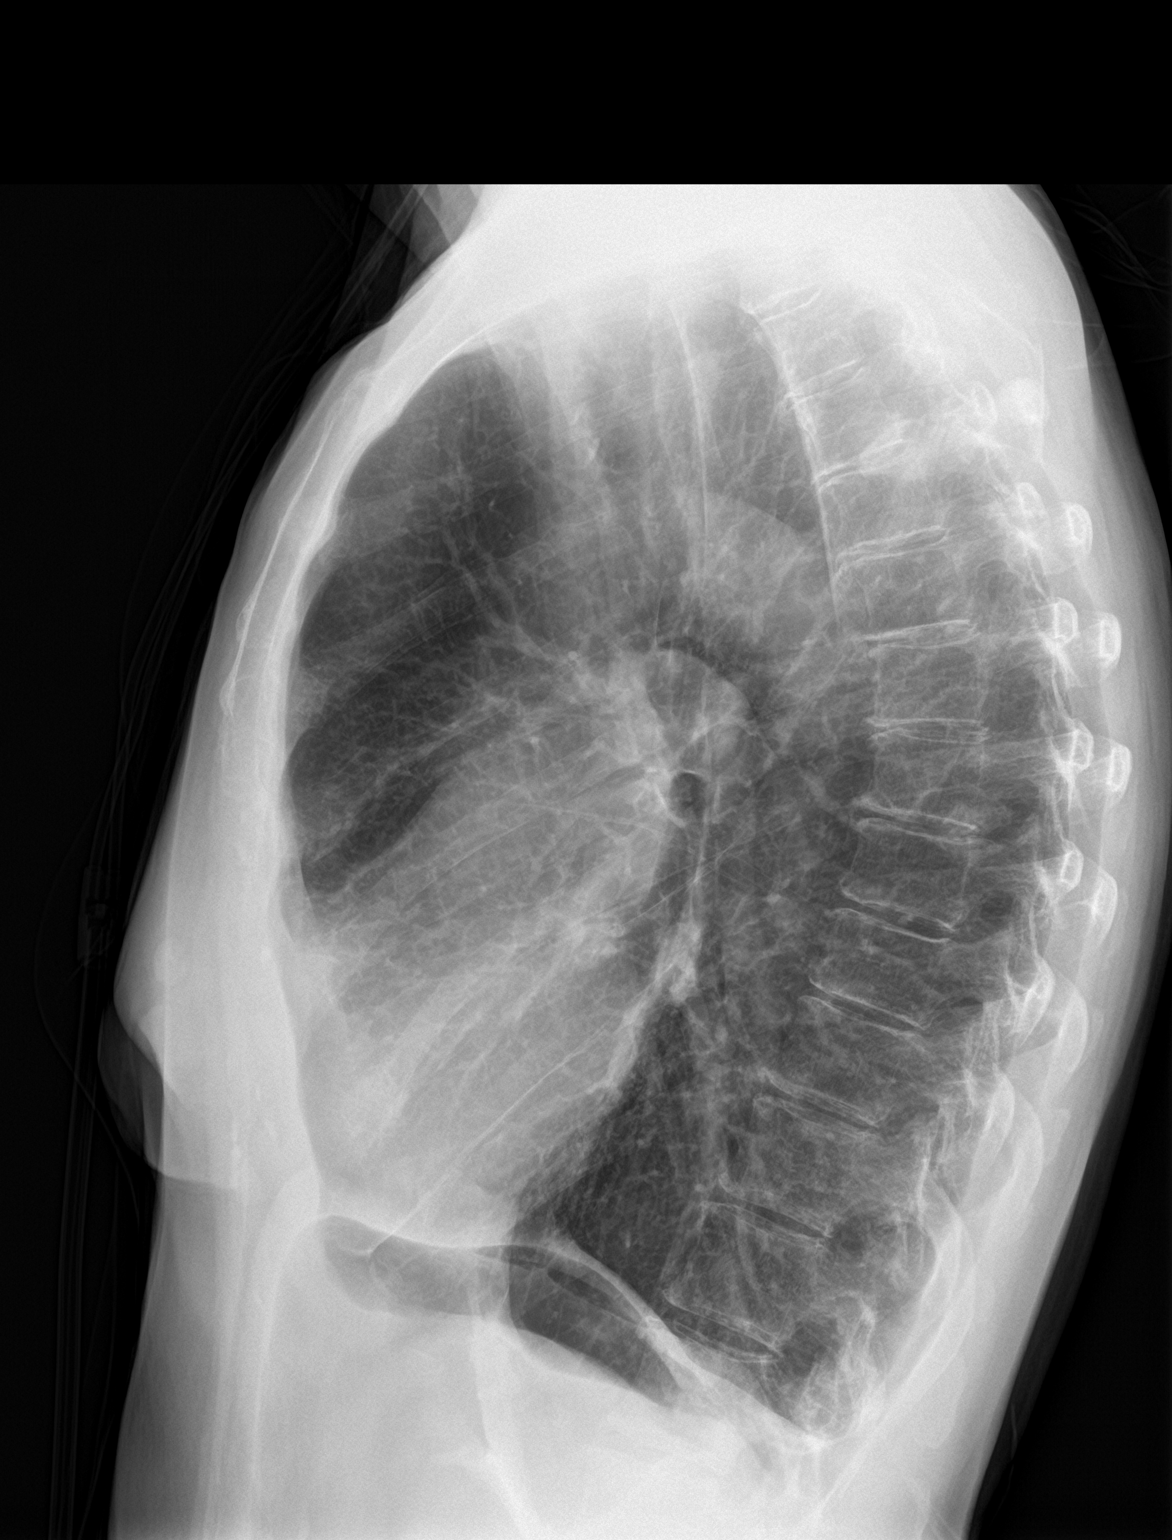

[2 of 2 positions shown; findings below may reference images not displayed]

FINDINGS: Emphysematous hyperinflation of the lungs consistent with history of
COPD remain stable. No pneumonic consolidation, effusion or
pneumothorax. Heart and mediastinal contours are stable with
atherosclerosis of the aorta. Old posttraumatic deformity of the
right fourth rib near the costal vertebral junction and also
posterolaterally. This may account for the calcified appearance near
the right costophrenic junction of this rib. This does not appear to
be related to a calcified paratracheal lymph node.
IMPRESSION: Emphysematous hyperinflation of lungs consistent with COPD. Old
posttraumatic deformity of the right fourth rib. Aortic
atherosclerosis.

## 2018-10-03 ENCOUNTER — Other Ambulatory Visit: Payer: Self-pay | Admitting: Pulmonary Disease

## 2018-10-13 DIAGNOSIS — J449 Chronic obstructive pulmonary disease, unspecified: Secondary | ICD-10-CM | POA: Diagnosis not present

## 2018-11-01 ENCOUNTER — Telehealth: Payer: Self-pay

## 2018-11-01 ENCOUNTER — Other Ambulatory Visit: Payer: Self-pay

## 2018-11-01 ENCOUNTER — Ambulatory Visit (INDEPENDENT_AMBULATORY_CARE_PROVIDER_SITE_OTHER): Payer: Medicare Other

## 2018-11-01 ENCOUNTER — Ambulatory Visit (INDEPENDENT_AMBULATORY_CARE_PROVIDER_SITE_OTHER): Payer: Medicare Other | Admitting: Adult Health

## 2018-11-01 ENCOUNTER — Encounter: Payer: Self-pay | Admitting: Adult Health

## 2018-11-01 ENCOUNTER — Telehealth: Payer: Self-pay | Admitting: General Practice

## 2018-11-01 VITALS — BP 102/60 | HR 121 | Temp 98.7°F

## 2018-11-01 DIAGNOSIS — J432 Centrilobular emphysema: Secondary | ICD-10-CM

## 2018-11-01 DIAGNOSIS — J9612 Chronic respiratory failure with hypercapnia: Secondary | ICD-10-CM

## 2018-11-01 DIAGNOSIS — J9611 Chronic respiratory failure with hypoxia: Secondary | ICD-10-CM | POA: Diagnosis not present

## 2018-11-01 DIAGNOSIS — Z20822 Contact with and (suspected) exposure to covid-19: Secondary | ICD-10-CM

## 2018-11-01 NOTE — Telephone Encounter (Signed)
Pt has been scheduled covid-19 testing.  °

## 2018-11-01 NOTE — Telephone Encounter (Signed)
Pt has been scheduled covid-19 testing.   Scheduled with pt directly at cell [facility].   Pt has been referred by: Melvenia Needles, NP

## 2018-11-01 NOTE — Telephone Encounter (Signed)
Please call cell phone [phone], they are wanting to have the testing done in [place] since they are here already. Not sure if they have testing sites in [facility].

## 2018-11-01 NOTE — Addendum Note (Signed)
Addended by: Denman George on: 11/01/2018 03:26 PM   Modules accepted: Orders

## 2018-11-01 NOTE — Assessment & Plan Note (Addendum)
Currently stable COVID-19 exposure patient does not seem to have any symptom burden.  We will send her for COVID-19 testing Check labs and ekg on return   Plan  Patient Instructions  Severe COPD: Continue Symbicort 2 puffs twice a day Continue Spiriva daily Practice good hand hygiene Stay physically active Continue daily azithromycin Chest xray today  Duoneb As needed    COVID 19 testing -recent exposure .   Allergic rhinitis Take generic zyrtec As needed    Chronic respiratory failure with hypoxemia: Keep using oxygen as you are doing  We will plan on seeing you back in 3 to 4 months with Dr. Lake Bells and As needed   Please contact office for sooner follow up if symptoms do not improve or worsen or seek emergency care

## 2018-11-01 NOTE — Assessment & Plan Note (Signed)
Continue on oxygen  Plan  Patient Instructions  Severe COPD: Continue Symbicort 2 puffs twice a day Continue Spiriva daily Practice good hand hygiene Stay physically active Continue daily azithromycin Chest xray today  Duoneb As needed    COVID 19 testing -recent exposure .   Allergic rhinitis Take generic zyrtec As needed    Chronic respiratory failure with hypoxemia: Keep using oxygen as you are doing  We will plan on seeing you back in 3 to 4 months with Dr. Lake Bells and As needed   Please contact office for sooner follow up if symptoms do not improve or worsen or seek emergency care      '

## 2018-11-01 NOTE — Telephone Encounter (Signed)
Pt needs testing for Covid.

## 2018-11-01 NOTE — Progress Notes (Signed)
Reviewed, agree 

## 2018-11-01 NOTE — Patient Instructions (Addendum)
Severe COPD: Continue Symbicort 2 puffs twice a day Continue Spiriva daily Practice good hand hygiene Stay physically active Continue daily azithromycin Chest xray today  Duoneb As needed    COVID 19 testing -recent exposure .   Allergic rhinitis Take generic zyrtec As needed    Chronic respiratory failure with hypoxemia: Keep using oxygen as you are doing  We will plan on seeing you back in 3 to 4 months with Dr. Lake Bells and As needed   Please contact office for sooner follow up if symptoms do not improve or worsen or seek emergency care

## 2018-11-01 NOTE — Progress Notes (Signed)
@Patient  ID: Helen Scott, female    DOB: May 02, 1951, 68 y.o.   MRN: [id number]  Chief Complaint  Patient presents with  . Follow-up    COPD     Referring provider: Colon Branch, MD  HPI: 68 year old female former smoker followed for COPD and oxygen dependent respiratory failure     TEST/EVENTS :  Echocardiogram: August 2017 LVEF 55-60%, mild aortic  Cardiac stress test: August 2017 no evidence of ischemia  CXR 2017 >COPD/Emphysema   pfts 2010: FEV1 0.49 20%  (ratio 27%).  11/01/2018 Follow up : COPD and O2 RF  Patient returns for a 82-month follow-up.  She has underlying COPD and oxygen dependent respiratory failure.  Patient says overall breathing is been doing the same.  She gets short of breath with heavy activity.  Has intermittent cough.  She denies any increased congestion fever chest pain orthopnea PND or increased leg swelling.  She remains on Symbicort and Spiriva.  Says that she is sedentary gets winded with heavy activity.  She is on oxygen 3 L.  Says her nose is been irritated on occasion.  Patient does report that she was exposed to a COVID positive person.  She says that the lady cleaning her house called her almost 2 weeks ago and said that she was positive.  She had been in her home cleaning while patient was there. She denies any fever or increased symptom burden.  No increased oxygen demands.  Allergies  Allergen Reactions  . Latex Other (See Comments)    Only Latex Tape causes Rash and abrasive     Immunization History  Administered Date(s) Administered  . Influenza Split 02/03/2012  . Influenza Whole 03/11/2007, 02/20/2008, 02/11/2010, 01/28/2011  . Influenza, High Dose Seasonal PF 02/12/2015, 02/10/2016, 02/18/2017, 02/09/2018  . Influenza,inj,Quad PF,6+ Mos 02/02/2013, 02/02/2014  . Pneumococcal Conjugate-13 12/08/2013, 03/10/2014  . Pneumococcal Polysaccharide-23 11/08/2003, 02/18/2009, 12/03/2015  . Td 03/11/2007, 10/15/2017  . Zoster  02/21/2010    Past Medical History:  Diagnosis Date  . CVA (cerebral infarction) 06/1999  . Emphysema    FEV1 0.49, FEV1% 27 on 5/10  . Hyperlipidemia   . OSTEOPENIA 03/27/2009  . PUD (peptic ulcer disease)     s/p partial gastrectomy 1985.      Tobacco History: Social History   Tobacco Use  Smoking Status Former Smoker  . Packs/day: 1.50  . Years: 40.00  . Pack years: 60.00  . Types: Cigarettes  . Last attempt to quit: 05/25/2005  . Years since quitting: 13.4  Smokeless Tobacco Never Used  Tobacco Comment   1 ppd started at age 40   Counseling given: Not Answered Comment: 1 ppd started at age 71   Outpatient Medications Prior to Visit  Medication Sig Dispense Refill  . albuterol (PROVENTIL HFA;VENTOLIN HFA) 108 (90 Base) MCG/ACT inhaler Inhale 1-2 puffs into the lungs every 4 (four) hours as needed for wheezing or shortness of breath. 18 g 5  . azithromycin (ZITHROMAX) 250 MG tablet TAKE 1 TABLET BY MOUTH DAILY 30 tablet 2  . budesonide-formoterol (SYMBICORT) 160-4.5 MCG/ACT inhaler Inhale 2 puffs into the lungs 2 (two) times daily. 1 Inhaler 6  . clopidogrel (PLAVIX) 75 MG tablet Take 1 tablet (75 mg total) by mouth daily. 90 tablet 3  . Coral Calcium 1000 (390 Ca) MG TABS Take 1000mg  daily    . denosumab (PROLIA) 60 MG/ML SOSY injection Inject 60 mg into the skin every 6 (six) months. 1 Syringe 1  . escitalopram (LEXAPRO)  10 MG tablet Take 0.5 tablets (5 mg total) by mouth daily. 45 tablet 2  . Ferrous Sulfate (IRON) 325 (65 Fe) MG TABS Take 1 tablet (325 mg total) by mouth 2 (two) times daily with a meal.    . ipratropium-albuterol (DUONEB) 0.5-2.5 (3) MG/3ML SOLN Take 3 mLs by nebulization every 6 (six) hours as needed. 360 mL 5  . pantoprazole (PROTONIX) 40 MG tablet Take 40 mg by mouth daily.    Marland Kitchen Respiratory Therapy Supplies (FLUTTER) DEVI Use as directed 1 each 0  . simvastatin (ZOCOR) 40 MG tablet Take 1 tablet (40 mg total) by mouth at bedtime. 90 tablet 2  .  tiotropium (SPIRIVA HANDIHALER) 18 MCG inhalation capsule INHALE CONTENTS OF 1 CAPSULE ONCE DAILY USING HANDIHALER 150 capsule 5   No facility-administered medications prior to visit.      Review of Systems:   Constitutional:   No  weight loss, night sweats,  Fevers, chills,  +fatigue, or  lassitude.  HEENT:   No headaches,  Difficulty swallowing,  Tooth/dental problems, or  Sore throat,                No sneezing, itching, ear ache + nasal congestion, post nasal drip,   CV:  No chest pain,  Orthopnea, PND, swelling in lower extremities, anasarca, dizziness, palpitations, syncope.   GI  No heartburn, indigestion, abdominal pain, nausea, vomiting, diarrhea, change in bowel habits, loss of appetite, bloody stools.   Resp:    No chest wall deformity  Skin: no rash or lesions.  GU: no dysuria, change in color of urine, no urgency or frequency.  No flank pain, no hematuria   MS:  No joint pain or swelling.  No decreased range of motion.  No back pain.    Physical Exam  BP 102/60 (BP Location: Left Arm, Cuff Size: Normal)   Pulse (!) 121   Temp 98.7 F (37.1 C)   SpO2 96%   GEN: A/Ox3; pleasant , NAD, elderly chronically ill-appearing, wheelchair, oxygen  HEENT:  [place]/AT,  EACs-clear, TMs-wnl, NOSE-clear, THROAT-clear, no lesions, no postnasal drip or exudate noted.   NECK:  Supple w/ fair ROM; no JVD; normal carotid impulses w/o bruits; no thyromegaly or nodules palpated; no lymphadenopathy.    RESP diminished breath sounds in the bases  no accessory muscle use, no dullness to percussion  CARD:  RRR, no m/r/g, no peripheral edema, pulses intact, no cyanosis or clubbing.  GI:   Soft & nt; nml bowel sounds; no organomegaly or masses detected.   Musco: Warm bil, no deformities or joint swelling noted.   Neuro: alert, no focal deficits noted.    Skin: Warm, no lesions or rashes    Lab Results:  BNP No results found for: BNP  ProBNP No results found for: PROBNP   Imaging: Dg Chest 2 View  Result Date: 11/01/2018 CLINICAL DATA:  Centrilobular emphysema. EXAM: CHEST - 2 VIEW COMPARISON:  Radiographs of March 20, 2016. FINDINGS: The heart size and mediastinal contours are within normal limits. Stable hyperinflation of the lungs is noted consistent with chronic obstructive pulmonary disease. No pneumothorax or pleural effusion is noted. No acute pulmonary process is noted. The visualized skeletal structures are unremarkable. IMPRESSION: Stable hyperinflation of the lungs is noted consistent with chronic obstructive pulmonary disease. No acute abnormality is noted. Electronically Signed   By: Marijo Conception M.D.   On: 11/01/2018 14:46      No flowsheet data found.  No results found  for: NITRICOXIDE      Assessment & Plan:   COPD (chronic obstructive pulmonary disease) ([place]) Currently stable COVID-19 exposure patient does not seem to have any symptom burden.  We will send her for COVID-19 testing Check labs and ekg on return   Plan  Patient Instructions  Severe COPD: Continue Symbicort 2 puffs twice a day Continue Spiriva daily Practice good hand hygiene Stay physically active Continue daily azithromycin Chest xray today  Duoneb As needed    COVID 19 testing -recent exposure .   Allergic rhinitis Take generic zyrtec As needed    Chronic respiratory failure with hypoxemia: Keep using oxygen as you are doing  We will plan on seeing you back in 3 to 4 months with Dr. Lake Bells and As needed   Please contact office for sooner follow up if symptoms do not improve or worsen or seek emergency care         CHRONIC RESPIRATORY FAILURE Continue on oxygen  Plan  Patient Instructions  Severe COPD: Continue Symbicort 2 puffs twice a day Continue Spiriva daily Practice good hand hygiene Stay physically active Continue daily azithromycin Chest xray today  Duoneb As needed    COVID 19 testing -recent exposure .   Allergic rhinitis  Take generic zyrtec As needed    Chronic respiratory failure with hypoxemia: Keep using oxygen as you are doing  We will plan on seeing you back in 3 to 4 months with Dr. Lake Bells and As needed   Please contact office for sooner follow up if symptoms do not improve or worsen or seek emergency care      '      Tammy Parrett, NP 11/01/2018

## 2018-11-02 ENCOUNTER — Other Ambulatory Visit: Payer: Self-pay

## 2018-11-02 ENCOUNTER — Telehealth: Payer: Self-pay | Admitting: *Deleted

## 2018-11-02 NOTE — Telephone Encounter (Signed)
Rescheduled covid testing for 11/03/18 for 10am at patient's request.

## 2018-11-03 ENCOUNTER — Other Ambulatory Visit: Payer: Self-pay

## 2018-11-03 ENCOUNTER — Other Ambulatory Visit: Payer: Self-pay | Admitting: Internal Medicine

## 2018-11-03 DIAGNOSIS — R6889 Other general symptoms and signs: Secondary | ICD-10-CM | POA: Diagnosis not present

## 2018-11-05 LAB — NOVEL CORONAVIRUS, NAA: SARS-CoV-2, NAA: NOT DETECTED

## 2018-11-09 ENCOUNTER — Telehealth: Payer: Self-pay | Admitting: Internal Medicine

## 2018-11-09 NOTE — Telephone Encounter (Signed)
Pt is aware covid 19 test is negative °

## 2018-11-13 DIAGNOSIS — J449 Chronic obstructive pulmonary disease, unspecified: Secondary | ICD-10-CM | POA: Diagnosis not present

## 2018-11-16 DIAGNOSIS — I10 Essential (primary) hypertension: Secondary | ICD-10-CM | POA: Diagnosis not present

## 2018-11-16 DIAGNOSIS — E860 Dehydration: Secondary | ICD-10-CM | POA: Diagnosis not present

## 2018-11-16 DIAGNOSIS — F419 Anxiety disorder, unspecified: Secondary | ICD-10-CM | POA: Diagnosis not present

## 2018-11-16 DIAGNOSIS — I517 Cardiomegaly: Secondary | ICD-10-CM | POA: Diagnosis not present

## 2018-11-16 DIAGNOSIS — J449 Chronic obstructive pulmonary disease, unspecified: Secondary | ICD-10-CM | POA: Diagnosis not present

## 2018-11-16 DIAGNOSIS — Z7951 Long term (current) use of inhaled steroids: Secondary | ICD-10-CM | POA: Diagnosis not present

## 2018-11-16 DIAGNOSIS — J441 Chronic obstructive pulmonary disease with (acute) exacerbation: Secondary | ICD-10-CM | POA: Diagnosis not present

## 2018-11-16 DIAGNOSIS — Z87891 Personal history of nicotine dependence: Secondary | ICD-10-CM | POA: Diagnosis not present

## 2018-11-16 DIAGNOSIS — R069 Unspecified abnormalities of breathing: Secondary | ICD-10-CM | POA: Diagnosis not present

## 2018-11-16 DIAGNOSIS — J9601 Acute respiratory failure with hypoxia: Secondary | ICD-10-CM | POA: Diagnosis not present

## 2018-11-16 DIAGNOSIS — J440 Chronic obstructive pulmonary disease with acute lower respiratory infection: Secondary | ICD-10-CM | POA: Diagnosis not present

## 2018-11-16 DIAGNOSIS — R0602 Shortness of breath: Secondary | ICD-10-CM | POA: Diagnosis not present

## 2018-11-16 DIAGNOSIS — Z8673 Personal history of transient ischemic attack (TIA), and cerebral infarction without residual deficits: Secondary | ICD-10-CM | POA: Diagnosis not present

## 2018-11-16 DIAGNOSIS — J181 Lobar pneumonia, unspecified organism: Secondary | ICD-10-CM | POA: Diagnosis not present

## 2018-11-16 DIAGNOSIS — J432 Centrilobular emphysema: Secondary | ICD-10-CM | POA: Diagnosis not present

## 2018-11-16 DIAGNOSIS — Z681 Body mass index (BMI) 19 or less, adult: Secondary | ICD-10-CM | POA: Diagnosis not present

## 2018-11-16 DIAGNOSIS — J189 Pneumonia, unspecified organism: Secondary | ICD-10-CM | POA: Diagnosis not present

## 2018-11-16 DIAGNOSIS — J9622 Acute and chronic respiratory failure with hypercapnia: Secondary | ICD-10-CM | POA: Diagnosis not present

## 2018-11-16 DIAGNOSIS — R0902 Hypoxemia: Secondary | ICD-10-CM | POA: Diagnosis not present

## 2018-11-16 DIAGNOSIS — Z03818 Encounter for observation for suspected exposure to other biological agents ruled out: Secondary | ICD-10-CM | POA: Diagnosis not present

## 2018-11-16 DIAGNOSIS — F329 Major depressive disorder, single episode, unspecified: Secondary | ICD-10-CM | POA: Diagnosis not present

## 2018-11-16 DIAGNOSIS — R000 Tachycardia, unspecified: Secondary | ICD-10-CM | POA: Diagnosis not present

## 2018-11-16 DIAGNOSIS — J9621 Acute and chronic respiratory failure with hypoxia: Secondary | ICD-10-CM | POA: Diagnosis not present

## 2018-11-16 DIAGNOSIS — D638 Anemia in other chronic diseases classified elsewhere: Secondary | ICD-10-CM | POA: Diagnosis not present

## 2018-11-16 DIAGNOSIS — Z8711 Personal history of peptic ulcer disease: Secondary | ICD-10-CM | POA: Diagnosis not present

## 2018-11-16 DIAGNOSIS — R636 Underweight: Secondary | ICD-10-CM | POA: Diagnosis not present

## 2018-11-16 DIAGNOSIS — Z20828 Contact with and (suspected) exposure to other viral communicable diseases: Secondary | ICD-10-CM | POA: Diagnosis not present

## 2018-11-23 MED ORDER — PROMETHAZINE HCL 25 MG PO TABS
25.00 | ORAL_TABLET | ORAL | Status: DC
Start: ? — End: 2018-11-23

## 2018-11-23 MED ORDER — CVS EAR DROPS OT
40.00 | OTIC | Status: DC
Start: 2018-11-23 — End: 2018-11-23

## 2018-11-23 MED ORDER — AZITHROMYCIN 250 MG PO TABS
500.00 | ORAL_TABLET | ORAL | Status: DC
Start: 2018-11-24 — End: 2018-11-23

## 2018-11-23 MED ORDER — STOPCOCK SET/STERILE/4-WAY MISC
40.00 | Status: DC
Start: 2018-11-23 — End: 2018-11-23

## 2018-11-23 MED ORDER — PANTOPRAZOLE SODIUM 40 MG PO TBEC
40.00 | DELAYED_RELEASE_TABLET | ORAL | Status: DC
Start: 2018-11-24 — End: 2018-11-23

## 2018-11-23 MED ORDER — ONDANSETRON HCL 4 MG/2ML IJ SOLN
4.00 | INTRAMUSCULAR | Status: DC
Start: ? — End: 2018-11-23

## 2018-11-23 MED ORDER — SUBDUE PLUS PO LIQD
75.00 | ORAL | Status: DC
Start: 2018-11-24 — End: 2018-11-23

## 2018-11-23 MED ORDER — DIHYDROTACHYSTEROL 0.125 MG PO TABS
20.00 | ORAL_TABLET | ORAL | Status: DC
Start: 2018-11-23 — End: 2018-11-23

## 2018-11-23 MED ORDER — HYDROCODONE-ACETAMINOPHEN 10-250 MG PO TABS
0.25 | ORAL_TABLET | ORAL | Status: DC
Start: ? — End: 2018-11-23

## 2018-11-23 MED ORDER — Medication
1.00 | Status: DC
Start: 2018-11-24 — End: 2018-11-23

## 2018-11-23 MED ORDER — DURACLON EP
1000.00 | EPIDURAL | Status: DC
Start: 2018-11-24 — End: 2018-11-23

## 2018-11-23 MED ORDER — ACETAMINOPHEN 325 MG PO TABS
650.00 | ORAL_TABLET | ORAL | Status: DC
Start: ? — End: 2018-11-23

## 2018-11-23 MED ORDER — PIPERACILLIN SODIUM
10.00 | Status: DC
Start: ? — End: 2018-11-23

## 2018-11-23 MED ORDER — DOCUSATE SODIUM 100 MG PO CAPS
100.00 | ORAL_CAPSULE | ORAL | Status: DC
Start: ? — End: 2018-11-23

## 2018-11-23 MED ORDER — BENZONATATE 100 MG PO CAPS
100.00 | ORAL_CAPSULE | ORAL | Status: DC
Start: ? — End: 2018-11-23

## 2018-11-23 MED ORDER — UMECLIDINIUM BROMIDE 62.5 MCG/INH IN AEPB
1.00 | INHALATION_SPRAY | RESPIRATORY_TRACT | Status: DC
Start: 2018-11-24 — End: 2018-11-23

## 2018-11-23 MED ORDER — Medication
30.00 | Status: DC
Start: ? — End: 2018-11-23

## 2018-11-23 MED ORDER — CLONIDINE HCL 0.1 MG PO TABS
0.10 | ORAL_TABLET | ORAL | Status: DC
Start: ? — End: 2018-11-23

## 2018-11-23 MED ORDER — ARMOUR THYROID 30 MG PO TABS
2.00 | ORAL_TABLET | ORAL | Status: DC
Start: ? — End: 2018-11-23

## 2018-11-23 MED ORDER — KENALOG IN ORABASE 0.1 % MT PSTE
1.00 | PASTE | OROMUCOSAL | Status: DC
Start: 2018-11-24 — End: 2018-11-23

## 2018-11-23 MED ORDER — Medication
500.00 | Status: DC
Start: 2018-11-24 — End: 2018-11-23

## 2018-11-23 MED ORDER — PRENATAL PLUS NF PO TABS
5.00 | ORAL_TABLET | ORAL | Status: DC
Start: 2018-11-24 — End: 2018-11-23

## 2018-11-24 ENCOUNTER — Telehealth: Payer: Self-pay | Admitting: *Deleted

## 2018-11-24 DIAGNOSIS — J441 Chronic obstructive pulmonary disease with (acute) exacerbation: Secondary | ICD-10-CM | POA: Diagnosis not present

## 2018-11-24 DIAGNOSIS — Z8673 Personal history of transient ischemic attack (TIA), and cerebral infarction without residual deficits: Secondary | ICD-10-CM | POA: Diagnosis not present

## 2018-11-24 DIAGNOSIS — Z681 Body mass index (BMI) 19 or less, adult: Secondary | ICD-10-CM | POA: Diagnosis not present

## 2018-11-24 DIAGNOSIS — J449 Chronic obstructive pulmonary disease, unspecified: Secondary | ICD-10-CM | POA: Diagnosis not present

## 2018-11-24 DIAGNOSIS — Z9049 Acquired absence of other specified parts of digestive tract: Secondary | ICD-10-CM | POA: Diagnosis not present

## 2018-11-24 DIAGNOSIS — Z87891 Personal history of nicotine dependence: Secondary | ICD-10-CM | POA: Diagnosis not present

## 2018-11-24 DIAGNOSIS — J9611 Chronic respiratory failure with hypoxia: Secondary | ICD-10-CM | POA: Diagnosis not present

## 2018-11-24 DIAGNOSIS — Z7902 Long term (current) use of antithrombotics/antiplatelets: Secondary | ICD-10-CM | POA: Diagnosis not present

## 2018-11-24 DIAGNOSIS — Z9981 Dependence on supplemental oxygen: Secondary | ICD-10-CM | POA: Diagnosis not present

## 2018-11-24 DIAGNOSIS — J160 Chlamydial pneumonia: Secondary | ICD-10-CM | POA: Diagnosis not present

## 2018-11-24 NOTE — Telephone Encounter (Signed)
Hospital follow up 12/01/18 with PCP

## 2018-11-28 ENCOUNTER — Telehealth: Payer: Self-pay | Admitting: Internal Medicine

## 2018-11-28 DIAGNOSIS — Z9981 Dependence on supplemental oxygen: Secondary | ICD-10-CM | POA: Diagnosis not present

## 2018-11-28 DIAGNOSIS — Z9049 Acquired absence of other specified parts of digestive tract: Secondary | ICD-10-CM | POA: Diagnosis not present

## 2018-11-28 DIAGNOSIS — J441 Chronic obstructive pulmonary disease with (acute) exacerbation: Secondary | ICD-10-CM | POA: Diagnosis not present

## 2018-11-28 DIAGNOSIS — Z87891 Personal history of nicotine dependence: Secondary | ICD-10-CM | POA: Diagnosis not present

## 2018-11-28 DIAGNOSIS — Z681 Body mass index (BMI) 19 or less, adult: Secondary | ICD-10-CM | POA: Diagnosis not present

## 2018-11-28 DIAGNOSIS — Z8673 Personal history of transient ischemic attack (TIA), and cerebral infarction without residual deficits: Secondary | ICD-10-CM | POA: Diagnosis not present

## 2018-11-28 DIAGNOSIS — J160 Chlamydial pneumonia: Secondary | ICD-10-CM | POA: Diagnosis not present

## 2018-11-28 DIAGNOSIS — J449 Chronic obstructive pulmonary disease, unspecified: Secondary | ICD-10-CM | POA: Diagnosis not present

## 2018-11-28 DIAGNOSIS — J9611 Chronic respiratory failure with hypoxia: Secondary | ICD-10-CM | POA: Diagnosis not present

## 2018-11-28 DIAGNOSIS — Z7902 Long term (current) use of antithrombotics/antiplatelets: Secondary | ICD-10-CM | POA: Diagnosis not present

## 2018-11-28 NOTE — Telephone Encounter (Signed)
Transition Care Management Follow-up Telephone Call   Date discharged? 11/24/18   How have you been since you were released from the hospital? "pretty good"   Do you understand why you were in the hospital? yes   Do you understand the discharge instructions? yes   Where were you discharged to? home   Items Reviewed:  Medications reviewed: pt states no med changes.  Allergies reviewed: yes  Dietary changes reviewed: yes  Referrals reviewed: yes   Functional Questionnaire:   Activities of Daily Living (ADLs):   She states they are independent in the following: ambulation, bathing and hygiene, feeding, continence, grooming, toileting and dressing States they require assistance with the following: n/a   Any transportation issues/concerns?: yes, daughter will have to bring her. Prefers to do virtual visit.   Any patient concerns? Prefers video visit   Confirmed importance and date/time of follow-up visits scheduled yes  Provider Appointment booked with PCP 12/06/18  Confirmed with patient if condition begins to worsen call PCP or go to the ER.  Patient was given the office number and encouraged to call back with question or concerns.  : yes

## 2018-11-28 NOTE — Telephone Encounter (Signed)
Spoke w/ Lennette Bihari- verbal orders given.

## 2018-11-28 NOTE — Telephone Encounter (Signed)
Copied from [place] [id number]. Topic: Quick Communication - Home Health Verbal Orders >> Nov 28, 2018 10:10 AM Jodie Echevaria wrote: Caller/Agency: Cleophus Molt  Callback Number: [phone] ok to LM  Requesting OT/PT/Skilled Nursing/Social Work/Speech Therapy: Physical Therapy Frequency: 2Xs  wk for 3 wks,   1X wk 3 weeks

## 2018-11-28 NOTE — Telephone Encounter (Signed)
Needs TCM please

## 2018-11-29 ENCOUNTER — Telehealth: Payer: Self-pay | Admitting: Adult Health

## 2018-11-29 DIAGNOSIS — J411 Mucopurulent chronic bronchitis: Secondary | ICD-10-CM

## 2018-11-29 DIAGNOSIS — Z09 Encounter for follow-up examination after completed treatment for conditions other than malignant neoplasm: Secondary | ICD-10-CM

## 2018-11-29 NOTE — Telephone Encounter (Signed)
Order for CXR placed. Patient requested an appt August 13th. Appt made. Voiced understanding. Nothing further is needed at this time.

## 2018-11-29 NOTE — Telephone Encounter (Signed)
Called patient and went over information .   Make sure she has a follow in 3-4 weeks with chest xray for post hospital visit with Parrett NP

## 2018-11-29 NOTE — Telephone Encounter (Signed)
Call returned to patient, she states she was last seen by TP and had a CXR done and she was told her CXR only showed changes re: her COPD but she states she ended up in the hospital with PNE and just got home. She is requesting to speak with TP regarding why that CXR did not show anything. She declined a tele visit. I explained that she seen TP 06/09 and she did not go to the ED until 06/24. I explained that during that time she could have developed PNE. She states she would rather discuss with TP.   TP please advise if willing to call this patient. Thanks.

## 2018-11-30 ENCOUNTER — Telehealth: Payer: Self-pay | Admitting: Internal Medicine

## 2018-11-30 DIAGNOSIS — Z87891 Personal history of nicotine dependence: Secondary | ICD-10-CM | POA: Diagnosis not present

## 2018-11-30 DIAGNOSIS — Z8673 Personal history of transient ischemic attack (TIA), and cerebral infarction without residual deficits: Secondary | ICD-10-CM | POA: Diagnosis not present

## 2018-11-30 DIAGNOSIS — J9611 Chronic respiratory failure with hypoxia: Secondary | ICD-10-CM | POA: Diagnosis not present

## 2018-11-30 DIAGNOSIS — Z9049 Acquired absence of other specified parts of digestive tract: Secondary | ICD-10-CM | POA: Diagnosis not present

## 2018-11-30 DIAGNOSIS — Z9981 Dependence on supplemental oxygen: Secondary | ICD-10-CM | POA: Diagnosis not present

## 2018-11-30 DIAGNOSIS — Z7902 Long term (current) use of antithrombotics/antiplatelets: Secondary | ICD-10-CM | POA: Diagnosis not present

## 2018-11-30 DIAGNOSIS — J449 Chronic obstructive pulmonary disease, unspecified: Secondary | ICD-10-CM | POA: Diagnosis not present

## 2018-11-30 DIAGNOSIS — Z681 Body mass index (BMI) 19 or less, adult: Secondary | ICD-10-CM | POA: Diagnosis not present

## 2018-11-30 DIAGNOSIS — J441 Chronic obstructive pulmonary disease with (acute) exacerbation: Secondary | ICD-10-CM | POA: Diagnosis not present

## 2018-11-30 DIAGNOSIS — J160 Chlamydial pneumonia: Secondary | ICD-10-CM | POA: Diagnosis not present

## 2018-11-30 NOTE — Telephone Encounter (Signed)
Spoke w/ Richard- verbal orders given.

## 2018-11-30 NOTE — Telephone Encounter (Signed)
Caller/Agency: Delfino Lovett, OT with [facility]  Requesting OT  Frequency: 1w7 Contact # [phone]

## 2018-12-01 ENCOUNTER — Ambulatory Visit: Payer: Self-pay | Admitting: Internal Medicine

## 2018-12-01 DIAGNOSIS — J160 Chlamydial pneumonia: Secondary | ICD-10-CM | POA: Diagnosis not present

## 2018-12-01 DIAGNOSIS — J441 Chronic obstructive pulmonary disease with (acute) exacerbation: Secondary | ICD-10-CM | POA: Diagnosis not present

## 2018-12-01 DIAGNOSIS — Z8673 Personal history of transient ischemic attack (TIA), and cerebral infarction without residual deficits: Secondary | ICD-10-CM | POA: Diagnosis not present

## 2018-12-01 DIAGNOSIS — Z9049 Acquired absence of other specified parts of digestive tract: Secondary | ICD-10-CM | POA: Diagnosis not present

## 2018-12-01 DIAGNOSIS — Z87891 Personal history of nicotine dependence: Secondary | ICD-10-CM | POA: Diagnosis not present

## 2018-12-01 DIAGNOSIS — J9611 Chronic respiratory failure with hypoxia: Secondary | ICD-10-CM | POA: Diagnosis not present

## 2018-12-01 DIAGNOSIS — Z681 Body mass index (BMI) 19 or less, adult: Secondary | ICD-10-CM | POA: Diagnosis not present

## 2018-12-01 DIAGNOSIS — Z9981 Dependence on supplemental oxygen: Secondary | ICD-10-CM | POA: Diagnosis not present

## 2018-12-01 DIAGNOSIS — Z7902 Long term (current) use of antithrombotics/antiplatelets: Secondary | ICD-10-CM | POA: Diagnosis not present

## 2018-12-01 DIAGNOSIS — J449 Chronic obstructive pulmonary disease, unspecified: Secondary | ICD-10-CM | POA: Diagnosis not present

## 2018-12-05 ENCOUNTER — Telehealth: Payer: Self-pay

## 2018-12-05 DIAGNOSIS — J160 Chlamydial pneumonia: Secondary | ICD-10-CM | POA: Diagnosis not present

## 2018-12-05 DIAGNOSIS — Z681 Body mass index (BMI) 19 or less, adult: Secondary | ICD-10-CM

## 2018-12-05 DIAGNOSIS — Z9049 Acquired absence of other specified parts of digestive tract: Secondary | ICD-10-CM

## 2018-12-05 DIAGNOSIS — Z9981 Dependence on supplemental oxygen: Secondary | ICD-10-CM | POA: Diagnosis not present

## 2018-12-05 DIAGNOSIS — Z87891 Personal history of nicotine dependence: Secondary | ICD-10-CM

## 2018-12-05 DIAGNOSIS — Z8673 Personal history of transient ischemic attack (TIA), and cerebral infarction without residual deficits: Secondary | ICD-10-CM

## 2018-12-05 DIAGNOSIS — Z7902 Long term (current) use of antithrombotics/antiplatelets: Secondary | ICD-10-CM

## 2018-12-05 DIAGNOSIS — J9611 Chronic respiratory failure with hypoxia: Secondary | ICD-10-CM | POA: Diagnosis not present

## 2018-12-05 DIAGNOSIS — J441 Chronic obstructive pulmonary disease with (acute) exacerbation: Secondary | ICD-10-CM | POA: Diagnosis not present

## 2018-12-05 NOTE — Telephone Encounter (Signed)
Plan of care signed and faxed to [facility] at [phone]. Form sent for scanning.

## 2018-12-06 ENCOUNTER — Ambulatory Visit (INDEPENDENT_AMBULATORY_CARE_PROVIDER_SITE_OTHER): Payer: Medicare Other | Admitting: Internal Medicine

## 2018-12-06 ENCOUNTER — Other Ambulatory Visit: Payer: Self-pay

## 2018-12-06 DIAGNOSIS — Z9981 Dependence on supplemental oxygen: Secondary | ICD-10-CM | POA: Diagnosis not present

## 2018-12-06 DIAGNOSIS — J160 Chlamydial pneumonia: Secondary | ICD-10-CM | POA: Diagnosis not present

## 2018-12-06 DIAGNOSIS — F419 Anxiety disorder, unspecified: Secondary | ICD-10-CM | POA: Diagnosis not present

## 2018-12-06 DIAGNOSIS — M818 Other osteoporosis without current pathological fracture: Secondary | ICD-10-CM

## 2018-12-06 DIAGNOSIS — J9611 Chronic respiratory failure with hypoxia: Secondary | ICD-10-CM | POA: Diagnosis not present

## 2018-12-06 DIAGNOSIS — J9612 Chronic respiratory failure with hypercapnia: Secondary | ICD-10-CM

## 2018-12-06 DIAGNOSIS — F32A Depression, unspecified: Secondary | ICD-10-CM

## 2018-12-06 DIAGNOSIS — J441 Chronic obstructive pulmonary disease with (acute) exacerbation: Secondary | ICD-10-CM | POA: Diagnosis not present

## 2018-12-06 DIAGNOSIS — Z681 Body mass index (BMI) 19 or less, adult: Secondary | ICD-10-CM | POA: Diagnosis not present

## 2018-12-06 DIAGNOSIS — Z9049 Acquired absence of other specified parts of digestive tract: Secondary | ICD-10-CM | POA: Diagnosis not present

## 2018-12-06 DIAGNOSIS — Z7902 Long term (current) use of antithrombotics/antiplatelets: Secondary | ICD-10-CM | POA: Diagnosis not present

## 2018-12-06 DIAGNOSIS — Z87891 Personal history of nicotine dependence: Secondary | ICD-10-CM | POA: Diagnosis not present

## 2018-12-06 DIAGNOSIS — Z8673 Personal history of transient ischemic attack (TIA), and cerebral infarction without residual deficits: Secondary | ICD-10-CM | POA: Diagnosis not present

## 2018-12-06 DIAGNOSIS — J449 Chronic obstructive pulmonary disease, unspecified: Secondary | ICD-10-CM | POA: Diagnosis not present

## 2018-12-06 DIAGNOSIS — F329 Major depressive disorder, single episode, unspecified: Secondary | ICD-10-CM

## 2018-12-06 MED ORDER — ALPRAZOLAM 0.25 MG PO TABS: 0.2500 mg | ORAL_TABLET | Freq: Two times a day (BID) | ORAL | 0 refills | Status: DC | PRN

## 2018-12-06 NOTE — Progress Notes (Signed)
Subjective:    Patient ID: Helen Scott, female    DOB: 02/04/1951, 68 y.o.   MRN: [id number]  DOS:  12/06/2018 Type of visit - description: Virtual Visit via Video Note  I connected with@ on 12/08/18 at  4:40 PM EDT by a video enabled telemedicine application and verified that I am speaking with the correct person using two identifiers.   THIS ENCOUNTER IS A VIRTUAL VISIT DUE TO COVID-19 - PATIENT WAS NOT SEEN IN THE OFFICE. PATIENT HAS CONSENTED TO VIRTUAL VISIT / TELEMEDICINE VISIT   Location of patient: home  Location of provider: office  I discussed the limitations of evaluation and management by telemedicine and the availability of in person appointments. The patient expressed understanding and agreed to proceed.  History of Present Illness:  Hospital follow-up  Patient was admitted last month to an outside institution on 11/16/2018, discharged November 24, 2018.Marland Kitchen Diagnoses were: Acute hypoxic respiratory failure Acute COPD exacerbation Community-acquired pneumonia, treated with Zithromax and Rocephin. COVID-19 ruled  Out  Last chest x-ray 11/18/2018: ATX/infiltrates improved Hemoglobin was 10.7 Creatinine was 0.5.  Review of Systems Since she left the hospital she is at home, shortness of breath still there, improving but not back to baseline. Denies any fever chills Still has cough and occasional greenish sputum.  No hemoptysis. Has not checked her weight but denies lower extremity edema. No nausea, vomiting, diarrhea. She self decrease iron to 1 tablet daily because iron is causing hard  stools, dark in color. Anxiety not well controlled, request Xanax.  Past Medical History:  Diagnosis Date  . CVA (cerebral infarction) 06/1999  . Emphysema    FEV1 0.49, FEV1% 27 on 5/10  . Hyperlipidemia   . OSTEOPENIA 03/27/2009  . PUD (peptic ulcer disease)     s/p partial gastrectomy 1985.      Past Surgical History:  Procedure Laterality Date  . BREAST BIOPSY  80s   R  breast   . PARTIAL GASTRECTOMY      s/p partial gastrectomy 1985.    . TUBAL LIGATION  1980s    Social History   Socioeconomic History  . Marital status: Single    Spouse name: Not on file  . Number of children: 2  . Years of education: Not on file  . Highest education level: Not on file  Occupational History  . Occupation: disability,previously worked in Therapist, art.    Employer: [employer]  Social Needs  . Financial resource strain: Not on file  . Food insecurity    Worry: Not on file    Inability: Not on file  . Transportation needs    Medical: Not on file    Non-medical: Not on file  Tobacco Use  . Smoking status: Former Smoker    Packs/day: 1.50    Years: 40.00    Pack years: 60.00    Types: Cigarettes    Quit date: 05/25/2005    Years since quitting: 13.5  . Smokeless tobacco: Never Used  . Tobacco comment: 1 ppd started at age 36  Substance and Sexual Activity  . Alcohol use: No    Alcohol/week: 0.0 standard drinks    Comment: rarely  . Drug use: No  . Sexual activity: Never  Lifestyle  . Physical activity    Days per week: Not on file    Minutes per session: Not on file  . Stress: Not on file  Relationships  . Social connections    Talks on phone: Not on file  Gets together: Not on file    Attends religious service: Not on file    Active member of club or organization: Not on file    Attends meetings of clubs or organizations: Not on file    Relationship status: Not on file  . Intimate partner violence    Fear of current or ex partner: Not on file    Emotionally abused: Not on file    Physically abused: Not on file    Forced sexual activity: Not on file  Other Topics Concern  . Not on file  Social History Narrative   one living child, Blair Promise, lives in [place] one child.    Single, lives by self, drives; ADLs getting limited       Allergies as of 12/06/2018      Reactions   Latex Other (See Comments)   Only Latex Tape  causes Rash and abrasive       Medication List       Accurate as of December 06, 2018 11:59 PM. If you have any questions, ask your nurse or doctor.        albuterol 108 (90 Base) MCG/ACT inhaler Commonly known as: VENTOLIN HFA Inhale 1-2 puffs into the lungs every 4 (four) hours as needed for wheezing or shortness of breath.   ALPRAZolam 0.25 MG tablet Commonly known as: XANAX Take 1 tablet (0.25 mg total) by mouth 2 (two) times daily as needed for anxiety or sleep. Started by: Kathlene November, MD   azithromycin 250 MG tablet Commonly known as: ZITHROMAX TAKE 1 TABLET BY MOUTH DAILY   budesonide-formoterol 160-4.5 MCG/ACT inhaler Commonly known as: Symbicort Inhale 2 puffs into the lungs 2 (two) times daily.   clopidogrel 75 MG tablet Commonly known as: PLAVIX Take 1 tablet (75 mg total) by mouth daily.   Coral Calcium 1000 (390 Ca) MG Tabs Take 1000mg  daily   denosumab 60 MG/ML Sosy injection Commonly known as: PROLIA Inject 60 mg into the skin every 6 (six) months.   escitalopram 10 MG tablet Commonly known as: LEXAPRO Take 1 tablet (10 mg total) by mouth daily. What changed: how much to take Changed by: Kathlene November, MD   Flutter [facility] Use as directed   ipratropium-albuterol 0.5-2.5 (3) MG/3ML Soln Commonly known as: DUONEB Take 3 mLs by nebulization every 6 (six) hours as needed.   Iron 325 (65 Fe) MG Tabs Take 1 tablet (325 mg total) by mouth every other day. What changed: when to take this Changed by: Kathlene November, MD   pantoprazole 40 MG tablet Commonly known as: PROTONIX Take 40 mg by mouth daily.   simvastatin 40 MG tablet Commonly known as: ZOCOR Take 1 tablet (40 mg total) by mouth at bedtime.   tiotropium 18 MCG inhalation capsule Commonly known as: Spiriva HandiHaler INHALE CONTENTS OF 1 CAPSULE ONCE DAILY USING HANDIHALER           Objective:   Physical Exam There were no vitals taken for this visit. This is a virtual video visit, the patient is  alert oriented x3, has a oxygen cannula in place, speech clear and in complete sentences.    Assessment     Assessment COPD,Chronic respiratory failure ----->  O2 24/7. 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  Receiving PT at home and has a nurse visiting her on and off. Plan: Continue daily Zithromax, Symbicort, Spiriva, duonebs and albuterol. Depression, anxiety: While in the hospital, she received a low-dose of Xanax, reports that she works great for her.  Requesting a prescription. We agreed to continue Lexapro but increase dose to 10 mg daily. Sent  a prescription for Xanax 0.25 mg twice daily as needed anxiety insomnia Anemia: Patient self decrease iron to 1 tablet daily, still is causing hard, dark stools.  Decrease to 1 tablet every other day Osteoporosis: Due for Prolia will hold it for now.  See next. COVID-19 quarantine.  Ideally, I would bring the patient for a Prolia shot, face-to-face visit, chest x-ray and labs but at this point I think is safer for her to stay home. She will call tomorrow and make an appointment for a  2 month f/u.  Today, I spent more than 25 min with the patient: >50% of the time counseling regards depression, anxiety, pros and cons of Xanax.  Also reviewed the chart and labs ordered by other providers.     I discussed the assessment and treatment plan with the patient. The patient was provided an opportunity to ask questions and all were answered. The patient agreed with the plan and demonstrated an understanding of the instructions.   The patient was advised to call back or seek an  in-person evaluation if the symptoms worsen or if the condition fails to improve as anticipated.

## 2018-12-07 DIAGNOSIS — J160 Chlamydial pneumonia: Secondary | ICD-10-CM | POA: Diagnosis not present

## 2018-12-07 DIAGNOSIS — Z9049 Acquired absence of other specified parts of digestive tract: Secondary | ICD-10-CM | POA: Diagnosis not present

## 2018-12-07 DIAGNOSIS — Z7902 Long term (current) use of antithrombotics/antiplatelets: Secondary | ICD-10-CM | POA: Diagnosis not present

## 2018-12-07 DIAGNOSIS — J9611 Chronic respiratory failure with hypoxia: Secondary | ICD-10-CM | POA: Diagnosis not present

## 2018-12-07 DIAGNOSIS — Z8673 Personal history of transient ischemic attack (TIA), and cerebral infarction without residual deficits: Secondary | ICD-10-CM | POA: Diagnosis not present

## 2018-12-07 DIAGNOSIS — J449 Chronic obstructive pulmonary disease, unspecified: Secondary | ICD-10-CM | POA: Diagnosis not present

## 2018-12-07 DIAGNOSIS — Z9981 Dependence on supplemental oxygen: Secondary | ICD-10-CM | POA: Diagnosis not present

## 2018-12-07 DIAGNOSIS — J441 Chronic obstructive pulmonary disease with (acute) exacerbation: Secondary | ICD-10-CM | POA: Diagnosis not present

## 2018-12-07 DIAGNOSIS — Z681 Body mass index (BMI) 19 or less, adult: Secondary | ICD-10-CM | POA: Diagnosis not present

## 2018-12-07 DIAGNOSIS — Z87891 Personal history of nicotine dependence: Secondary | ICD-10-CM | POA: Diagnosis not present

## 2018-12-08 DIAGNOSIS — Z9981 Dependence on supplemental oxygen: Secondary | ICD-10-CM | POA: Diagnosis not present

## 2018-12-08 DIAGNOSIS — Z7902 Long term (current) use of antithrombotics/antiplatelets: Secondary | ICD-10-CM | POA: Diagnosis not present

## 2018-12-08 DIAGNOSIS — Z87891 Personal history of nicotine dependence: Secondary | ICD-10-CM | POA: Diagnosis not present

## 2018-12-08 DIAGNOSIS — Z9049 Acquired absence of other specified parts of digestive tract: Secondary | ICD-10-CM | POA: Diagnosis not present

## 2018-12-08 DIAGNOSIS — Z8673 Personal history of transient ischemic attack (TIA), and cerebral infarction without residual deficits: Secondary | ICD-10-CM | POA: Diagnosis not present

## 2018-12-08 DIAGNOSIS — Z681 Body mass index (BMI) 19 or less, adult: Secondary | ICD-10-CM | POA: Diagnosis not present

## 2018-12-08 DIAGNOSIS — J160 Chlamydial pneumonia: Secondary | ICD-10-CM | POA: Diagnosis not present

## 2018-12-08 DIAGNOSIS — J449 Chronic obstructive pulmonary disease, unspecified: Secondary | ICD-10-CM | POA: Diagnosis not present

## 2018-12-08 DIAGNOSIS — J9611 Chronic respiratory failure with hypoxia: Secondary | ICD-10-CM | POA: Diagnosis not present

## 2018-12-08 DIAGNOSIS — J441 Chronic obstructive pulmonary disease with (acute) exacerbation: Secondary | ICD-10-CM | POA: Diagnosis not present

## 2018-12-08 NOTE — Assessment & Plan Note (Signed)
Chronic respiratory failure, status post exacerbation: Admitted to [facility], discharge July 2, improving gradually, not yet back to baseline.  Receiving PT at home and has a nurse visiting her on and off. Plan: Continue daily Zithromax, Symbicort, Spiriva, duonebs and albuterol. Depression, anxiety: While in the hospital, she received a low-dose of Xanax, reports that she works great for her.  Requesting a prescription. We agreed to continue Lexapro but increase dose to 10 mg daily. Sent  a prescription for Xanax 0.25 mg twice daily as needed anxiety insomnia Anemia: Patient self decrease iron to 1 tablet daily, still is causing hard, dark stools.  Decrease to 1 tablet every other day Osteoporosis: Due for Prolia will hold it for now.  See next. COVID-19 quarantine.  Ideally, I would bring the patient for a Prolia shot, face-to-face visit, chest x-ray and labs but at this point I think is safer for her to stay home. She will call tomorrow and make an appointment for a  2 month f/u.

## 2018-12-09 DIAGNOSIS — Z9049 Acquired absence of other specified parts of digestive tract: Secondary | ICD-10-CM | POA: Diagnosis not present

## 2018-12-09 DIAGNOSIS — Z8673 Personal history of transient ischemic attack (TIA), and cerebral infarction without residual deficits: Secondary | ICD-10-CM | POA: Diagnosis not present

## 2018-12-09 DIAGNOSIS — Z7902 Long term (current) use of antithrombotics/antiplatelets: Secondary | ICD-10-CM | POA: Diagnosis not present

## 2018-12-09 DIAGNOSIS — Z9981 Dependence on supplemental oxygen: Secondary | ICD-10-CM | POA: Diagnosis not present

## 2018-12-09 DIAGNOSIS — J9611 Chronic respiratory failure with hypoxia: Secondary | ICD-10-CM | POA: Diagnosis not present

## 2018-12-09 DIAGNOSIS — J449 Chronic obstructive pulmonary disease, unspecified: Secondary | ICD-10-CM | POA: Diagnosis not present

## 2018-12-09 DIAGNOSIS — Z87891 Personal history of nicotine dependence: Secondary | ICD-10-CM | POA: Diagnosis not present

## 2018-12-09 DIAGNOSIS — J441 Chronic obstructive pulmonary disease with (acute) exacerbation: Secondary | ICD-10-CM | POA: Diagnosis not present

## 2018-12-09 DIAGNOSIS — Z681 Body mass index (BMI) 19 or less, adult: Secondary | ICD-10-CM | POA: Diagnosis not present

## 2018-12-09 DIAGNOSIS — J160 Chlamydial pneumonia: Secondary | ICD-10-CM | POA: Diagnosis not present

## 2018-12-12 DIAGNOSIS — Z7902 Long term (current) use of antithrombotics/antiplatelets: Secondary | ICD-10-CM | POA: Diagnosis not present

## 2018-12-12 DIAGNOSIS — Z9049 Acquired absence of other specified parts of digestive tract: Secondary | ICD-10-CM | POA: Diagnosis not present

## 2018-12-12 DIAGNOSIS — J449 Chronic obstructive pulmonary disease, unspecified: Secondary | ICD-10-CM | POA: Diagnosis not present

## 2018-12-12 DIAGNOSIS — J160 Chlamydial pneumonia: Secondary | ICD-10-CM | POA: Diagnosis not present

## 2018-12-12 DIAGNOSIS — J441 Chronic obstructive pulmonary disease with (acute) exacerbation: Secondary | ICD-10-CM | POA: Diagnosis not present

## 2018-12-12 DIAGNOSIS — Z681 Body mass index (BMI) 19 or less, adult: Secondary | ICD-10-CM | POA: Diagnosis not present

## 2018-12-12 DIAGNOSIS — Z9981 Dependence on supplemental oxygen: Secondary | ICD-10-CM | POA: Diagnosis not present

## 2018-12-12 DIAGNOSIS — J9611 Chronic respiratory failure with hypoxia: Secondary | ICD-10-CM | POA: Diagnosis not present

## 2018-12-12 DIAGNOSIS — Z87891 Personal history of nicotine dependence: Secondary | ICD-10-CM | POA: Diagnosis not present

## 2018-12-12 DIAGNOSIS — Z8673 Personal history of transient ischemic attack (TIA), and cerebral infarction without residual deficits: Secondary | ICD-10-CM | POA: Diagnosis not present

## 2018-12-13 DIAGNOSIS — Z9981 Dependence on supplemental oxygen: Secondary | ICD-10-CM | POA: Diagnosis not present

## 2018-12-13 DIAGNOSIS — Z8673 Personal history of transient ischemic attack (TIA), and cerebral infarction without residual deficits: Secondary | ICD-10-CM | POA: Diagnosis not present

## 2018-12-13 DIAGNOSIS — J441 Chronic obstructive pulmonary disease with (acute) exacerbation: Secondary | ICD-10-CM | POA: Diagnosis not present

## 2018-12-13 DIAGNOSIS — J9611 Chronic respiratory failure with hypoxia: Secondary | ICD-10-CM | POA: Diagnosis not present

## 2018-12-13 DIAGNOSIS — Z7902 Long term (current) use of antithrombotics/antiplatelets: Secondary | ICD-10-CM | POA: Diagnosis not present

## 2018-12-13 DIAGNOSIS — Z9049 Acquired absence of other specified parts of digestive tract: Secondary | ICD-10-CM | POA: Diagnosis not present

## 2018-12-13 DIAGNOSIS — J449 Chronic obstructive pulmonary disease, unspecified: Secondary | ICD-10-CM | POA: Diagnosis not present

## 2018-12-13 DIAGNOSIS — Z681 Body mass index (BMI) 19 or less, adult: Secondary | ICD-10-CM | POA: Diagnosis not present

## 2018-12-13 DIAGNOSIS — J160 Chlamydial pneumonia: Secondary | ICD-10-CM | POA: Diagnosis not present

## 2018-12-13 DIAGNOSIS — Z87891 Personal history of nicotine dependence: Secondary | ICD-10-CM | POA: Diagnosis not present

## 2018-12-15 DIAGNOSIS — Z9049 Acquired absence of other specified parts of digestive tract: Secondary | ICD-10-CM | POA: Diagnosis not present

## 2018-12-15 DIAGNOSIS — Z87891 Personal history of nicotine dependence: Secondary | ICD-10-CM | POA: Diagnosis not present

## 2018-12-15 DIAGNOSIS — J9611 Chronic respiratory failure with hypoxia: Secondary | ICD-10-CM | POA: Diagnosis not present

## 2018-12-15 DIAGNOSIS — Z9981 Dependence on supplemental oxygen: Secondary | ICD-10-CM | POA: Diagnosis not present

## 2018-12-15 DIAGNOSIS — J449 Chronic obstructive pulmonary disease, unspecified: Secondary | ICD-10-CM | POA: Diagnosis not present

## 2018-12-15 DIAGNOSIS — J160 Chlamydial pneumonia: Secondary | ICD-10-CM | POA: Diagnosis not present

## 2018-12-15 DIAGNOSIS — Z7902 Long term (current) use of antithrombotics/antiplatelets: Secondary | ICD-10-CM | POA: Diagnosis not present

## 2018-12-15 DIAGNOSIS — Z681 Body mass index (BMI) 19 or less, adult: Secondary | ICD-10-CM | POA: Diagnosis not present

## 2018-12-15 DIAGNOSIS — J441 Chronic obstructive pulmonary disease with (acute) exacerbation: Secondary | ICD-10-CM | POA: Diagnosis not present

## 2018-12-15 DIAGNOSIS — Z8673 Personal history of transient ischemic attack (TIA), and cerebral infarction without residual deficits: Secondary | ICD-10-CM | POA: Diagnosis not present

## 2018-12-22 DIAGNOSIS — J9611 Chronic respiratory failure with hypoxia: Secondary | ICD-10-CM | POA: Diagnosis not present

## 2018-12-22 DIAGNOSIS — Z9981 Dependence on supplemental oxygen: Secondary | ICD-10-CM | POA: Diagnosis not present

## 2018-12-22 DIAGNOSIS — J160 Chlamydial pneumonia: Secondary | ICD-10-CM | POA: Diagnosis not present

## 2018-12-22 DIAGNOSIS — J449 Chronic obstructive pulmonary disease, unspecified: Secondary | ICD-10-CM | POA: Diagnosis not present

## 2018-12-22 DIAGNOSIS — Z9049 Acquired absence of other specified parts of digestive tract: Secondary | ICD-10-CM | POA: Diagnosis not present

## 2018-12-22 DIAGNOSIS — Z87891 Personal history of nicotine dependence: Secondary | ICD-10-CM | POA: Diagnosis not present

## 2018-12-22 DIAGNOSIS — J441 Chronic obstructive pulmonary disease with (acute) exacerbation: Secondary | ICD-10-CM | POA: Diagnosis not present

## 2018-12-22 DIAGNOSIS — Z8673 Personal history of transient ischemic attack (TIA), and cerebral infarction without residual deficits: Secondary | ICD-10-CM | POA: Diagnosis not present

## 2018-12-22 DIAGNOSIS — Z681 Body mass index (BMI) 19 or less, adult: Secondary | ICD-10-CM | POA: Diagnosis not present

## 2018-12-22 DIAGNOSIS — Z7902 Long term (current) use of antithrombotics/antiplatelets: Secondary | ICD-10-CM | POA: Diagnosis not present

## 2018-12-23 DIAGNOSIS — J441 Chronic obstructive pulmonary disease with (acute) exacerbation: Secondary | ICD-10-CM | POA: Diagnosis not present

## 2018-12-23 DIAGNOSIS — J449 Chronic obstructive pulmonary disease, unspecified: Secondary | ICD-10-CM | POA: Diagnosis not present

## 2018-12-23 DIAGNOSIS — Z87891 Personal history of nicotine dependence: Secondary | ICD-10-CM | POA: Diagnosis not present

## 2018-12-23 DIAGNOSIS — J160 Chlamydial pneumonia: Secondary | ICD-10-CM | POA: Diagnosis not present

## 2018-12-23 DIAGNOSIS — Z9049 Acquired absence of other specified parts of digestive tract: Secondary | ICD-10-CM | POA: Diagnosis not present

## 2018-12-23 DIAGNOSIS — Z9981 Dependence on supplemental oxygen: Secondary | ICD-10-CM | POA: Diagnosis not present

## 2018-12-23 DIAGNOSIS — Z681 Body mass index (BMI) 19 or less, adult: Secondary | ICD-10-CM | POA: Diagnosis not present

## 2018-12-23 DIAGNOSIS — Z7902 Long term (current) use of antithrombotics/antiplatelets: Secondary | ICD-10-CM | POA: Diagnosis not present

## 2018-12-23 DIAGNOSIS — Z8673 Personal history of transient ischemic attack (TIA), and cerebral infarction without residual deficits: Secondary | ICD-10-CM | POA: Diagnosis not present

## 2018-12-23 DIAGNOSIS — J9611 Chronic respiratory failure with hypoxia: Secondary | ICD-10-CM | POA: Diagnosis not present

## 2018-12-26 DIAGNOSIS — Z9049 Acquired absence of other specified parts of digestive tract: Secondary | ICD-10-CM | POA: Diagnosis not present

## 2018-12-26 DIAGNOSIS — Z9981 Dependence on supplemental oxygen: Secondary | ICD-10-CM | POA: Diagnosis not present

## 2018-12-26 DIAGNOSIS — J9611 Chronic respiratory failure with hypoxia: Secondary | ICD-10-CM | POA: Diagnosis not present

## 2018-12-26 DIAGNOSIS — Z681 Body mass index (BMI) 19 or less, adult: Secondary | ICD-10-CM | POA: Diagnosis not present

## 2018-12-26 DIAGNOSIS — J449 Chronic obstructive pulmonary disease, unspecified: Secondary | ICD-10-CM | POA: Diagnosis not present

## 2018-12-26 DIAGNOSIS — Z8673 Personal history of transient ischemic attack (TIA), and cerebral infarction without residual deficits: Secondary | ICD-10-CM | POA: Diagnosis not present

## 2018-12-26 DIAGNOSIS — J160 Chlamydial pneumonia: Secondary | ICD-10-CM | POA: Diagnosis not present

## 2018-12-26 DIAGNOSIS — Z87891 Personal history of nicotine dependence: Secondary | ICD-10-CM | POA: Diagnosis not present

## 2018-12-26 DIAGNOSIS — J441 Chronic obstructive pulmonary disease with (acute) exacerbation: Secondary | ICD-10-CM | POA: Diagnosis not present

## 2018-12-26 DIAGNOSIS — Z7902 Long term (current) use of antithrombotics/antiplatelets: Secondary | ICD-10-CM | POA: Diagnosis not present

## 2018-12-28 DIAGNOSIS — Z7902 Long term (current) use of antithrombotics/antiplatelets: Secondary | ICD-10-CM | POA: Diagnosis not present

## 2018-12-28 DIAGNOSIS — Z8673 Personal history of transient ischemic attack (TIA), and cerebral infarction without residual deficits: Secondary | ICD-10-CM | POA: Diagnosis not present

## 2018-12-28 DIAGNOSIS — Z9049 Acquired absence of other specified parts of digestive tract: Secondary | ICD-10-CM | POA: Diagnosis not present

## 2018-12-28 DIAGNOSIS — J441 Chronic obstructive pulmonary disease with (acute) exacerbation: Secondary | ICD-10-CM | POA: Diagnosis not present

## 2018-12-28 DIAGNOSIS — J449 Chronic obstructive pulmonary disease, unspecified: Secondary | ICD-10-CM | POA: Diagnosis not present

## 2018-12-28 DIAGNOSIS — J9611 Chronic respiratory failure with hypoxia: Secondary | ICD-10-CM | POA: Diagnosis not present

## 2018-12-28 DIAGNOSIS — Z681 Body mass index (BMI) 19 or less, adult: Secondary | ICD-10-CM | POA: Diagnosis not present

## 2018-12-28 DIAGNOSIS — Z87891 Personal history of nicotine dependence: Secondary | ICD-10-CM | POA: Diagnosis not present

## 2018-12-28 DIAGNOSIS — J160 Chlamydial pneumonia: Secondary | ICD-10-CM | POA: Diagnosis not present

## 2018-12-28 DIAGNOSIS — Z9981 Dependence on supplemental oxygen: Secondary | ICD-10-CM | POA: Diagnosis not present

## 2018-12-30 DIAGNOSIS — J441 Chronic obstructive pulmonary disease with (acute) exacerbation: Secondary | ICD-10-CM | POA: Diagnosis not present

## 2018-12-30 DIAGNOSIS — Z8673 Personal history of transient ischemic attack (TIA), and cerebral infarction without residual deficits: Secondary | ICD-10-CM | POA: Diagnosis not present

## 2018-12-30 DIAGNOSIS — J449 Chronic obstructive pulmonary disease, unspecified: Secondary | ICD-10-CM | POA: Diagnosis not present

## 2018-12-30 DIAGNOSIS — Z681 Body mass index (BMI) 19 or less, adult: Secondary | ICD-10-CM | POA: Diagnosis not present

## 2018-12-30 DIAGNOSIS — J160 Chlamydial pneumonia: Secondary | ICD-10-CM | POA: Diagnosis not present

## 2018-12-30 DIAGNOSIS — Z9049 Acquired absence of other specified parts of digestive tract: Secondary | ICD-10-CM | POA: Diagnosis not present

## 2018-12-30 DIAGNOSIS — Z9981 Dependence on supplemental oxygen: Secondary | ICD-10-CM | POA: Diagnosis not present

## 2018-12-30 DIAGNOSIS — Z87891 Personal history of nicotine dependence: Secondary | ICD-10-CM | POA: Diagnosis not present

## 2018-12-30 DIAGNOSIS — Z7902 Long term (current) use of antithrombotics/antiplatelets: Secondary | ICD-10-CM | POA: Diagnosis not present

## 2018-12-30 DIAGNOSIS — J9611 Chronic respiratory failure with hypoxia: Secondary | ICD-10-CM | POA: Diagnosis not present

## 2018-12-31 ENCOUNTER — Other Ambulatory Visit: Payer: Self-pay | Admitting: Pulmonary Disease

## 2019-01-02 DIAGNOSIS — J160 Chlamydial pneumonia: Secondary | ICD-10-CM | POA: Diagnosis not present

## 2019-01-02 DIAGNOSIS — Z7902 Long term (current) use of antithrombotics/antiplatelets: Secondary | ICD-10-CM | POA: Diagnosis not present

## 2019-01-02 DIAGNOSIS — J9611 Chronic respiratory failure with hypoxia: Secondary | ICD-10-CM | POA: Diagnosis not present

## 2019-01-02 DIAGNOSIS — Z8673 Personal history of transient ischemic attack (TIA), and cerebral infarction without residual deficits: Secondary | ICD-10-CM | POA: Diagnosis not present

## 2019-01-02 DIAGNOSIS — J441 Chronic obstructive pulmonary disease with (acute) exacerbation: Secondary | ICD-10-CM | POA: Diagnosis not present

## 2019-01-02 DIAGNOSIS — Z87891 Personal history of nicotine dependence: Secondary | ICD-10-CM | POA: Diagnosis not present

## 2019-01-02 DIAGNOSIS — Z9049 Acquired absence of other specified parts of digestive tract: Secondary | ICD-10-CM | POA: Diagnosis not present

## 2019-01-02 DIAGNOSIS — J449 Chronic obstructive pulmonary disease, unspecified: Secondary | ICD-10-CM | POA: Diagnosis not present

## 2019-01-02 DIAGNOSIS — Z681 Body mass index (BMI) 19 or less, adult: Secondary | ICD-10-CM | POA: Diagnosis not present

## 2019-01-02 DIAGNOSIS — Z9981 Dependence on supplemental oxygen: Secondary | ICD-10-CM | POA: Diagnosis not present

## 2019-01-04 DIAGNOSIS — Z9981 Dependence on supplemental oxygen: Secondary | ICD-10-CM | POA: Diagnosis not present

## 2019-01-04 DIAGNOSIS — Z681 Body mass index (BMI) 19 or less, adult: Secondary | ICD-10-CM | POA: Diagnosis not present

## 2019-01-04 DIAGNOSIS — J441 Chronic obstructive pulmonary disease with (acute) exacerbation: Secondary | ICD-10-CM | POA: Diagnosis not present

## 2019-01-04 DIAGNOSIS — J160 Chlamydial pneumonia: Secondary | ICD-10-CM | POA: Diagnosis not present

## 2019-01-04 DIAGNOSIS — J449 Chronic obstructive pulmonary disease, unspecified: Secondary | ICD-10-CM | POA: Diagnosis not present

## 2019-01-04 DIAGNOSIS — Z9049 Acquired absence of other specified parts of digestive tract: Secondary | ICD-10-CM | POA: Diagnosis not present

## 2019-01-04 DIAGNOSIS — Z87891 Personal history of nicotine dependence: Secondary | ICD-10-CM | POA: Diagnosis not present

## 2019-01-04 DIAGNOSIS — Z8673 Personal history of transient ischemic attack (TIA), and cerebral infarction without residual deficits: Secondary | ICD-10-CM | POA: Diagnosis not present

## 2019-01-04 DIAGNOSIS — J9611 Chronic respiratory failure with hypoxia: Secondary | ICD-10-CM | POA: Diagnosis not present

## 2019-01-04 DIAGNOSIS — Z7902 Long term (current) use of antithrombotics/antiplatelets: Secondary | ICD-10-CM | POA: Diagnosis not present

## 2019-01-05 ENCOUNTER — Ambulatory Visit: Payer: Medicare Other | Admitting: Adult Health

## 2019-01-05 ENCOUNTER — Telehealth: Payer: Self-pay | Admitting: Internal Medicine

## 2019-01-05 DIAGNOSIS — Z87891 Personal history of nicotine dependence: Secondary | ICD-10-CM | POA: Diagnosis not present

## 2019-01-05 DIAGNOSIS — Z681 Body mass index (BMI) 19 or less, adult: Secondary | ICD-10-CM | POA: Diagnosis not present

## 2019-01-05 DIAGNOSIS — J160 Chlamydial pneumonia: Secondary | ICD-10-CM | POA: Diagnosis not present

## 2019-01-05 DIAGNOSIS — Z9049 Acquired absence of other specified parts of digestive tract: Secondary | ICD-10-CM | POA: Diagnosis not present

## 2019-01-05 DIAGNOSIS — Z7902 Long term (current) use of antithrombotics/antiplatelets: Secondary | ICD-10-CM | POA: Diagnosis not present

## 2019-01-05 DIAGNOSIS — J9611 Chronic respiratory failure with hypoxia: Secondary | ICD-10-CM | POA: Diagnosis not present

## 2019-01-05 DIAGNOSIS — Z9981 Dependence on supplemental oxygen: Secondary | ICD-10-CM | POA: Diagnosis not present

## 2019-01-05 DIAGNOSIS — Z8673 Personal history of transient ischemic attack (TIA), and cerebral infarction without residual deficits: Secondary | ICD-10-CM | POA: Diagnosis not present

## 2019-01-05 DIAGNOSIS — J441 Chronic obstructive pulmonary disease with (acute) exacerbation: Secondary | ICD-10-CM | POA: Diagnosis not present

## 2019-01-05 DIAGNOSIS — J449 Chronic obstructive pulmonary disease, unspecified: Secondary | ICD-10-CM | POA: Diagnosis not present

## 2019-01-05 NOTE — Telephone Encounter (Signed)
Please advise 

## 2019-01-05 NOTE — Telephone Encounter (Signed)
Spoke w/ Randell Patient- informed of PCP recommendations. Charlene verbalized understanding.

## 2019-01-05 NOTE — Telephone Encounter (Signed)
Not sure if I can do much about it.  If she feels the social worker visits are very helpful, ask her to elaborate and I might write a letter of support so she can continue getting the visits.

## 2019-01-05 NOTE — Telephone Encounter (Signed)
Copied from [place] [phone]. Topic: General - Other >> Jan 05, 2019 10:05 AM Keene Breath wrote: Reason for CRM: Social worker called to inform the office that the patient's insurance is not approving any more social work visits.  Please advise and call back with any questions.  CB# [phone]

## 2019-01-10 DIAGNOSIS — Z87891 Personal history of nicotine dependence: Secondary | ICD-10-CM | POA: Diagnosis not present

## 2019-01-10 DIAGNOSIS — Z7902 Long term (current) use of antithrombotics/antiplatelets: Secondary | ICD-10-CM | POA: Diagnosis not present

## 2019-01-10 DIAGNOSIS — Z8673 Personal history of transient ischemic attack (TIA), and cerebral infarction without residual deficits: Secondary | ICD-10-CM | POA: Diagnosis not present

## 2019-01-10 DIAGNOSIS — J9611 Chronic respiratory failure with hypoxia: Secondary | ICD-10-CM | POA: Diagnosis not present

## 2019-01-10 DIAGNOSIS — J449 Chronic obstructive pulmonary disease, unspecified: Secondary | ICD-10-CM | POA: Diagnosis not present

## 2019-01-10 DIAGNOSIS — J160 Chlamydial pneumonia: Secondary | ICD-10-CM | POA: Diagnosis not present

## 2019-01-10 DIAGNOSIS — Z9049 Acquired absence of other specified parts of digestive tract: Secondary | ICD-10-CM | POA: Diagnosis not present

## 2019-01-10 DIAGNOSIS — Z681 Body mass index (BMI) 19 or less, adult: Secondary | ICD-10-CM | POA: Diagnosis not present

## 2019-01-10 DIAGNOSIS — J441 Chronic obstructive pulmonary disease with (acute) exacerbation: Secondary | ICD-10-CM | POA: Diagnosis not present

## 2019-01-10 DIAGNOSIS — Z9981 Dependence on supplemental oxygen: Secondary | ICD-10-CM | POA: Diagnosis not present

## 2019-01-13 DIAGNOSIS — J449 Chronic obstructive pulmonary disease, unspecified: Secondary | ICD-10-CM | POA: Diagnosis not present

## 2019-01-16 DIAGNOSIS — Z87891 Personal history of nicotine dependence: Secondary | ICD-10-CM | POA: Diagnosis not present

## 2019-01-16 DIAGNOSIS — J9611 Chronic respiratory failure with hypoxia: Secondary | ICD-10-CM | POA: Diagnosis not present

## 2019-01-16 DIAGNOSIS — Z7902 Long term (current) use of antithrombotics/antiplatelets: Secondary | ICD-10-CM | POA: Diagnosis not present

## 2019-01-16 DIAGNOSIS — Z681 Body mass index (BMI) 19 or less, adult: Secondary | ICD-10-CM | POA: Diagnosis not present

## 2019-01-16 DIAGNOSIS — J160 Chlamydial pneumonia: Secondary | ICD-10-CM | POA: Diagnosis not present

## 2019-01-16 DIAGNOSIS — Z8673 Personal history of transient ischemic attack (TIA), and cerebral infarction without residual deficits: Secondary | ICD-10-CM | POA: Diagnosis not present

## 2019-01-16 DIAGNOSIS — Z9981 Dependence on supplemental oxygen: Secondary | ICD-10-CM | POA: Diagnosis not present

## 2019-01-16 DIAGNOSIS — J441 Chronic obstructive pulmonary disease with (acute) exacerbation: Secondary | ICD-10-CM | POA: Diagnosis not present

## 2019-01-16 DIAGNOSIS — J449 Chronic obstructive pulmonary disease, unspecified: Secondary | ICD-10-CM | POA: Diagnosis not present

## 2019-01-16 DIAGNOSIS — Z9049 Acquired absence of other specified parts of digestive tract: Secondary | ICD-10-CM | POA: Diagnosis not present

## 2019-01-31 ENCOUNTER — Telehealth: Payer: Self-pay | Admitting: Internal Medicine

## 2019-01-31 NOTE — Telephone Encounter (Signed)
Sent!

## 2019-01-31 NOTE — Telephone Encounter (Signed)
Alprazolam refill.   Last OV: 12/06/2018 Last Fill: 12/06/2018 #60 and 0RF Pt sig:1 tab bid prn UDS: None

## 2019-02-05 ENCOUNTER — Other Ambulatory Visit: Payer: Self-pay | Admitting: Internal Medicine

## 2019-02-07 ENCOUNTER — Ambulatory Visit: Payer: Medicare Other | Admitting: Emergency Medicine

## 2019-02-07 ENCOUNTER — Other Ambulatory Visit: Payer: Self-pay

## 2019-02-07 ENCOUNTER — Encounter: Payer: Self-pay | Admitting: Emergency Medicine

## 2019-02-07 DIAGNOSIS — J411 Mucopurulent chronic bronchitis: Secondary | ICD-10-CM | POA: Diagnosis not present

## 2019-02-07 NOTE — Patient Instructions (Addendum)
Please continue Spiriva and Symbicort as you have been taking them.  Remember to rinse and gargle after the Symbicort. Keep your albuterol available to use either 1 nebulizer treatment or 2 puffs up to every 4 hours if needed for shortness of breath, chest tightness, wheezing. Continue your azithromycin every day as you have been taking it. Pneumonia shot is up-to-date. Get the high-dose flu shot this fall as planned. Continue your oxygen at 4 L/min at all times. Follow with Dr Lamonte Sakai in 3 months or sooner if you have any problems.

## 2019-02-07 NOTE — Progress Notes (Signed)
Subjective:    Patient ID: Helen Scott, female    DOB: 1951-03-26, 68 y.o.   MRN: [id number]  HPI 68 year old former smoker who has been followed in our office first by Dr. Gwenette Greet and then by Dr. Lake Bells for hypoxemic respiratory failure and severe COPD.  She has spirometry from 2010 that showed an FEV1 of 0.49 L.  She uses oxygen at 4 L/min.  Her current bronchodilator regimen includes Spiriva, Symbicort, albuterol which she uses approximately 2x a day.  She was started on suppressive azithromycin in 2019.  She has daily cough, has to clear mucous every afternoon. She does have some occasional desats with exertion. She was admitted for a PNA in July to [facility] - had never been admitted for COPD before. COVID negative.    Review of Systems  Past Medical History:  Diagnosis Date  . CVA (cerebral infarction) 06/1999  . Emphysema    FEV1 0.49, FEV1% 27 on 5/10  . Hyperlipidemia   . OSTEOPENIA 03/27/2009  . PUD (peptic ulcer disease)     s/p partial gastrectomy 1985.       Family History  Problem Relation Age of Onset  . Diabetes Father 49  . Heart disease Father        MI at age 29s  . Prostate cancer Father        dx in his 16s  . Breast cancer Maternal Aunt        aunt, great aunt  and 2 first cousins  . Hypertension Neg Hx   . Stroke Neg Hx   . Colon cancer Neg Hx      Social History   Socioeconomic History  . Marital status: Single    Spouse name: Not on file  . Number of children: 2  . Years of education: Not on file  . Highest education level: Not on file  Occupational History  . Occupation: disability,previously worked in Therapist, art.    Employer: [employer]  Social Needs  . Financial resource strain: Not on file  . Food insecurity    Worry: Not on file    Inability: Not on file  . Transportation needs    Medical: Not on file    Non-medical: Not on file  Tobacco Use  . Smoking status: Former Smoker    Packs/day: 1.50    Years: 40.00    Pack  years: 60.00    Types: Cigarettes    Quit date: 05/25/2005    Years since quitting: 13.7  . Smokeless tobacco: Never Used  . Tobacco comment: 1 ppd started at age 39  Substance and Sexual Activity  . Alcohol use: No    Alcohol/week: 0.0 standard drinks    Comment: rarely  . Drug use: No  . Sexual activity: Never  Lifestyle  . Physical activity    Days per week: Not on file    Minutes per session: Not on file  . Stress: Not on file  Relationships  . Social Herbalist on phone: Not on file    Gets together: Not on file    Attends religious service: Not on file    Active member of club or organization: Not on file    Attends meetings of clubs or organizations: Not on file    Relationship status: Not on file  . Intimate partner violence    Fear of current or ex partner: Not on file    Emotionally abused: Not on file  Physically abused: Not on file    Forced sexual activity: Not on file  Other Topics Concern  . Not on file  Social History Narrative   one living child, Blair Promise, lives in [place] one child.    Single, lives by self, drives; ADLs getting limited      Allergies  Allergen Reactions  . Latex Other (See Comments)    Only Latex Tape causes Rash and abrasive      Outpatient Medications Prior to Visit  Medication Sig Dispense Refill  . albuterol (PROVENTIL HFA;VENTOLIN HFA) 108 (90 Base) MCG/ACT inhaler Inhale 1-2 puffs into the lungs every 4 (four) hours as needed for wheezing or shortness of breath. 18 g 5  . ALPRAZolam (XANAX) 0.25 MG tablet TAKE 1 TABLET(0.25 MG) BY MOUTH TWICE DAILY AS NEEDED FOR ANXIETY OR SLEEP 60 tablet 1  . azithromycin (ZITHROMAX) 250 MG tablet TAKE 1 TABLET BY MOUTH DAILY. 30 tablet 2  . budesonide-formoterol (SYMBICORT) 160-4.5 MCG/ACT inhaler Inhale 2 puffs into the lungs 2 (two) times daily. 1 Inhaler 6  . clopidogrel (PLAVIX) 75 MG tablet Take 1 tablet (75 mg total) by mouth daily. 90 tablet 3  . Coral Calcium 1000  (390 Ca) MG TABS Take 1000mg  daily    . denosumab (PROLIA) 60 MG/ML SOSY injection Inject 60 mg into the skin every 6 (six) months. 1 Syringe 1  . escitalopram (LEXAPRO) 10 MG tablet Take 1 tablet (10 mg total) by mouth daily. 90 tablet 0  . Ferrous Sulfate (IRON) 325 (65 Fe) MG TABS Take 1 tablet (325 mg total) by mouth every other day.    . ipratropium-albuterol (DUONEB) 0.5-2.5 (3) MG/3ML SOLN Take 3 mLs by nebulization every 6 (six) hours as needed. 360 mL 5  . pantoprazole (PROTONIX) 40 MG tablet Take 40 mg by mouth daily.    Marland Kitchen Respiratory Therapy Supplies (FLUTTER) DEVI Use as directed 1 each 0  . simvastatin (ZOCOR) 40 MG tablet Take 1 tablet (40 mg total) by mouth at bedtime. 90 tablet 2  . tiotropium (SPIRIVA HANDIHALER) 18 MCG inhalation capsule INHALE CONTENTS OF 1 CAPSULE ONCE DAILY USING HANDIHALER 150 capsule 5   No facility-administered medications prior to visit.         Objective:   Physical Exam Vitals:   02/07/19 1140  BP: 132/72  Pulse: (!) 107  SpO2: 95%  Weight: 97 lb 12.8 oz (44.4 kg)  Height: 5\' 3"  (1.6 m)   Gen: Pleasant, very thin, in no distress,  normal affect  ENT: No lesions,  mouth clear,  oropharynx clear, no postnasal drip, dentures in place  Neck: No JVD, no stridor, strong voice  Lungs: No use of accessory muscles, no crackles or wheezing on normal respiration, no wheeze on forced expiration  Cardiovascular: RRR, heart sounds normal, no murmur or gallops, no peripheral edema  Musculoskeletal: No deformities, no cyanosis or clubbing  Neuro: alert, awake, non focal  Skin: Warm, no lesions or rash      Assessment & Plan:  COPD (chronic obstructive pulmonary disease) ([place]) She was admitted to [facility] in July for acute exacerbation/pneumonia.  Now back to her usual clinical baseline.  This was actually the only time she has ever been admitted for her COPD.  For now I believe we can continue her maintenance regimen.  It may be reasonable to  consider changing to combination long-acting bronchodilators at some point going forward.  She has benefited from the azithromycin and certainly we  should continue.  Please continue Spiriva and Symbicort as you have been taking them.  Remember to rinse and gargle after the Symbicort. Keep your albuterol available to use either 1 nebulizer treatment or 2 puffs up to every 4 hours if needed for shortness of breath, chest tightness, wheezing. Continue your azithromycin every day as you have been taking it. Pneumonia shot is up-to-date. Get the high-dose flu shot this fall as planned. Continue your oxygen at 4 L/min at all times. Follow with Dr Lamonte Sakai in 3 months or sooner if you have any problems.   Baltazar Apo, MD, PhD 02/07/2019, 12:10 PM  Pulmonary and Critical Care [phone] or if no answer [phone]

## 2019-02-07 NOTE — Assessment & Plan Note (Signed)
She was admitted to [facility] in July for acute exacerbation/pneumonia.  Now back to her usual clinical baseline.  This was actually the only time she has ever been admitted for her COPD.  For now I believe we can continue her maintenance regimen.  It may be reasonable to consider changing to combination long-acting bronchodilators at some point going forward.  She has benefited from the azithromycin and certainly we should continue.  Please continue Spiriva and Symbicort as you have been taking them.  Remember to rinse and gargle after the Symbicort. Keep your albuterol available to use either 1 nebulizer treatment or 2 puffs up to every 4 hours if needed for shortness of breath, chest tightness, wheezing. Continue your azithromycin every day as you have been taking it. Pneumonia shot is up-to-date. Get the high-dose flu shot this fall as planned. Continue your oxygen at 4 L/min at all times. Follow with Dr Lamonte Sakai in 3 months or sooner if you have any problems.

## 2019-02-13 DIAGNOSIS — J449 Chronic obstructive pulmonary disease, unspecified: Secondary | ICD-10-CM | POA: Diagnosis not present

## 2019-02-14 ENCOUNTER — Ambulatory Visit (INDEPENDENT_AMBULATORY_CARE_PROVIDER_SITE_OTHER): Payer: Medicare Other | Admitting: Internal Medicine

## 2019-02-14 ENCOUNTER — Encounter: Payer: Self-pay | Admitting: Internal Medicine

## 2019-02-14 ENCOUNTER — Other Ambulatory Visit: Payer: Self-pay

## 2019-02-14 VITALS — BP 138/71 | HR 108 | Temp 97.3°F | Ht 63.0 in | Wt 99.0 lb

## 2019-02-14 DIAGNOSIS — J9611 Chronic respiratory failure with hypoxia: Secondary | ICD-10-CM

## 2019-02-14 DIAGNOSIS — E785 Hyperlipidemia, unspecified: Secondary | ICD-10-CM

## 2019-02-14 DIAGNOSIS — M810 Age-related osteoporosis without current pathological fracture: Secondary | ICD-10-CM | POA: Diagnosis not present

## 2019-02-14 DIAGNOSIS — D649 Anemia, unspecified: Secondary | ICD-10-CM | POA: Diagnosis not present

## 2019-02-14 DIAGNOSIS — Z23 Encounter for immunization: Secondary | ICD-10-CM | POA: Diagnosis not present

## 2019-02-14 DIAGNOSIS — F32A Depression, unspecified: Secondary | ICD-10-CM

## 2019-02-14 DIAGNOSIS — J9612 Chronic respiratory failure with hypercapnia: Secondary | ICD-10-CM | POA: Diagnosis not present

## 2019-02-14 DIAGNOSIS — F419 Anxiety disorder, unspecified: Secondary | ICD-10-CM

## 2019-02-14 DIAGNOSIS — F329 Major depressive disorder, single episode, unspecified: Secondary | ICD-10-CM

## 2019-02-14 LAB — BASIC METABOLIC PANEL
BUN: 10 mg/dL (ref 6–23)
CO2: 44 mEq/L — ABNORMAL HIGH (ref 19–32)
Calcium: 9.7 mg/dL (ref 8.4–10.5)
Chloride: 95 mEq/L — ABNORMAL LOW (ref 96–112)
Creatinine, Ser: 0.48 mg/dL (ref 0.40–1.20)
GFR: 128.2 mL/min (ref >60.00)
Glucose, Bld: 92 mg/dL (ref 70–99)
Potassium: 4.5 mEq/L (ref 3.5–5.1)
Sodium: 144 mEq/L (ref 135–145)

## 2019-02-14 LAB — CBC WITH DIFFERENTIAL/PLATELET
Basophils Absolute: 0 10*3/uL (ref 0.0–0.1)
Basophils Relative: 0.5 % (ref 0.0–3.0)
Eosinophils Absolute: 0.1 10*3/uL (ref 0.0–0.7)
Eosinophils Relative: 1.6 % (ref 0.0–5.0)
HCT: 38 % (ref 36.0–46.0)
Hemoglobin: 12.2 g/dL (ref 12.0–15.0)
Lymphocytes Relative: 25.5 % (ref 12.0–46.0)
Lymphs Abs: 1.4 10*3/uL (ref 0.7–4.0)
MCHC: 32 g/dL (ref 30.0–36.0)
MCV: 92.1 fl (ref 78.0–100.0)
Monocytes Absolute: 0.4 10*3/uL (ref 0.1–1.0)
Monocytes Relative: 7.1 % (ref 3.0–12.0)
Neutro Abs: 3.5 10*3/uL (ref 1.4–7.7)
Neutrophils Relative %: 65.3 % (ref 43.0–77.0)
Platelets: 154 10*3/uL (ref 150.0–400.0)
RBC: 4.13 Mil/uL (ref 3.87–5.11)
RDW: 13.4 % (ref 11.5–15.5)
WBC: 5.3 10*3/uL (ref 4.0–10.5)

## 2019-02-14 LAB — ALT: ALT: 6 U/L (ref 0–35)

## 2019-02-14 LAB — LIPID PANEL
Cholesterol: 173 mg/dL (ref 0–200)
HDL: 68.6 mg/dL (ref >39.00)
LDL Cholesterol: 96 mg/dL (ref 0–99)
NonHDL: 104.58
Total CHOL/HDL Ratio: 3
Triglycerides: 45 mg/dL (ref 0.0–149.0)
VLDL: 9 mg/dL (ref 0.0–40.0)

## 2019-02-14 LAB — AST: AST: 16 U/L (ref 0–37)

## 2019-02-14 MED ORDER — DENOSUMAB 60 MG/ML ~~LOC~~ SOSY: 60.0000 mg | PREFILLED_SYRINGE | SUBCUTANEOUS | 0 refills | Status: AC

## 2019-02-14 NOTE — Assessment & Plan Note (Addendum)
COPD/chronic respiratory failure:  Seen by pulmonary yesterday was recommended to continue her maintenance regimen including daily Zithromax.  Seems a stable check CBC. Hyperlipidemia: Currently on simvastatin, last LDL elevated, recheck labs today, if LDL still elevated consider switch to Lipitor Depression: Currently on Lexapro 10 mg and Xanax as needed.  Denies excessive sedation, symptoms controlled.  RF as needed Osteoporosis: Sent prescription for Prolia, she will bring the medication to the office and get injection. Preventive care: Flu shot today Social: Still lives by herself and do some driving, her daughter Judeen Hammans is here today, she helps her a lot. RTC 4 months

## 2019-02-14 NOTE — Patient Instructions (Addendum)
GO TO THE LAB : Get the blood work     GO TO THE FRONT DESK Schedule your next appointment   for a checkup in 4 months  I sent a prescription for Prolia to your pharmacy

## 2019-02-14 NOTE — Progress Notes (Signed)
Subjective:    Patient ID: Helen Scott, female    DOB: 1951-04-27, 68 y.o.   MRN: [id number]  DOS:  02/14/2019 Type of visit - description: Routine office visit COPD: Note from pulmonary reviewed Anxiety depression: Currently well controlled Osteoporosis: Due for Prolia High cholesterol: On simvastatin, due for labs    Review of Systems Denies any fever chills No nausea, vomiting, diarrhea. Mild cough at baseline, occasional white sputum production.     Past Medical History:  Diagnosis Date  . CVA (cerebral infarction) 06/1999  . Emphysema    FEV1 0.49, FEV1% 27 on 5/10  . Hyperlipidemia   . OSTEOPENIA 03/27/2009  . PUD (peptic ulcer disease)     s/p partial gastrectomy 1985.      Past Surgical History:  Procedure Laterality Date  . BREAST BIOPSY  80s   R breast   . PARTIAL GASTRECTOMY      s/p partial gastrectomy 1985.    . TUBAL LIGATION  1980s    Social History   Socioeconomic History  . Marital status: Single    Spouse name: Not on file  . Number of children: 2  . Years of education: Not on file  . Highest education level: Not on file  Occupational History  . Occupation: disability,previously worked in Therapist, art.    Employer: Presenter, [employer]  Social Needs  . Financial resource strain: Not on file  . Food insecurity    Worry: Not on file    Inability: Not on file  . Transportation needs    Medical: Not on file    Non-medical: Not on file  Tobacco Use  . Smoking status: Former Smoker    Packs/day: 1.50    Years: 40.00    Pack years: 60.00    Types: Cigarettes    Quit date: 05/25/2005    Years since quitting: 13.7  . Smokeless tobacco: Never Used  . Tobacco comment: 1 ppd started at age 51  Substance and Sexual Activity  . Alcohol use: No    Alcohol/week: 0.0 standard drinks    Comment: rarely  . Drug use: No  . Sexual activity: Never  Lifestyle  . Physical activity    Days per week: Not on file    Minutes per session: Not on file   . Stress: Not on file  Relationships  . Social Herbalist on phone: Not on file    Gets together: Not on file    Attends religious service: Not on file    Active member of club or organization: Not on file    Attends meetings of clubs or organizations: Not on file    Relationship status: Not on file  . Intimate partner violence    Fear of current or ex partner: Not on file    Emotionally abused: Not on file    Physically abused: Not on file    Forced sexual activity: Not on file  Other Topics Concern  . Not on file  Social History Narrative   one living child, Blair Promise, lives in [place] one child.    Single, lives by self, drives; ADLs getting limited       Allergies as of 02/14/2019      Reactions   Latex Other (See Comments)   Only Latex Tape causes Rash and abrasive       Medication List       Accurate as of February 14, 2019 10:26 AM. If  you have any questions, ask your nurse or doctor.        albuterol 108 (90 Base) MCG/ACT inhaler Commonly known as: VENTOLIN HFA Inhale 1-2 puffs into the lungs every 4 (four) hours as needed for wheezing or shortness of breath.   ALPRAZolam 0.25 MG tablet Commonly known as: XANAX TAKE 1 TABLET(0.25 MG) BY MOUTH TWICE DAILY AS NEEDED FOR ANXIETY OR SLEEP   azithromycin 250 MG tablet Commonly known as: ZITHROMAX TAKE 1 TABLET BY MOUTH DAILY.   budesonide-formoterol 160-4.5 MCG/ACT inhaler Commonly known as: Symbicort Inhale 2 puffs into the lungs 2 (two) times daily.   clopidogrel 75 MG tablet Commonly known as: PLAVIX Take 1 tablet (75 mg total) by mouth daily.   Coral Calcium 1000 (390 Ca) MG Tabs Take 1000mg  daily   denosumab 60 MG/ML Sosy injection Commonly known as: PROLIA Inject 60 mg into the skin every 6 (six) months.   escitalopram 10 MG tablet Commonly known as: LEXAPRO Take 1 tablet (10 mg total) by mouth daily.   Flutter Devi Use as directed   ipratropium-albuterol 0.5-2.5 (3)  MG/3ML Soln Commonly known as: DUONEB Take 3 mLs by nebulization every 6 (six) hours as needed.   Iron 325 (65 Fe) MG Tabs Take 1 tablet (325 mg total) by mouth every other day.   pantoprazole 40 MG tablet Commonly known as: PROTONIX Take 40 mg by mouth daily.   simvastatin 40 MG tablet Commonly known as: ZOCOR Take 1 tablet (40 mg total) by mouth at bedtime.   tiotropium 18 MCG inhalation capsule Commonly known as: Spiriva HandiHaler INHALE CONTENTS OF 1 CAPSULE ONCE DAILY USING HANDIHALER           Objective:   Physical Exam BP 138/71 (BP Location: Left Arm, Patient Position: Sitting, Cuff Size: Normal)   Pulse (!) 108   Temp (!) 97.3 F (36.3 C) (Skin)   Ht 5\' 3"  (1.6 m)   Wt 99 lb (44.9 kg)   SpO2 94%   BMI 17.54 kg/m  General:   Well developed, NAD, BMI noted.  Underweight appearing. HEENT:  Normocephalic . Face symmetric, atraumatic Lungs:  Decreased breath sounds particularly at the bases Normal respiratory effort, no intercostal retractions, no accessory muscle use. Heart: RRR,  no murmur.  No pretibial edema bilaterally  Skin: Not pale. Not jaundice Neurologic:  alert & oriented X3.  Speech normal, gait not tested, sitting in a wheelchair Psych--  Cognition and judgment appear intact.  Cooperative with normal attention span and concentration.  Behavior appropriate. No anxious or depressed appearing.      Assessment      Assessment COPD,Chronic respiratory failure ----->  O2 24/7. +DOE w/ minimal exertion Hyperlipidemia Depression w/o anxiety (occ panick episode) - started Lexapro 7-16 PUD -- partial gastrectomy 1985 Osteoporosis: t score -2.7 (2014), -2.6 (03-18-15), on boniva from 2011 to 04-2015 1st prolia 06-05-15 H/o CVA 2001  PLAN: COPD/chronic respiratory failure:  Seen by pulmonary yesterday was recommended to continue her maintenance regimen including daily Zithromax.  Seems a stable check CBC. Hyperlipidemia: Currently on  simvastatin, last LDL elevated, recheck labs today, if LDL still elevated consider switch to Lipitor Depression: Currently on Lexapro 10 mg and Xanax as needed.  Denies excessive sedation, symptoms controlled.  RF as needed Osteoporosis: Sent prescription for Prolia, she will bring the medication to the office and get injection. Preventive care: Flu shot today Social: Still lives by herself and do some driving, her daughter Judeen Hammans is here today, she helps  her a lot. RTC 4 months

## 2019-02-15 ENCOUNTER — Telehealth: Payer: Self-pay

## 2019-02-15 ENCOUNTER — Encounter: Payer: Self-pay | Admitting: Internal Medicine

## 2019-02-15 NOTE — Telephone Encounter (Signed)
Opened in error

## 2019-02-15 NOTE — Telephone Encounter (Signed)
Copied from [place] [id number]. Topic: General - Other >> Feb 15, 2019 11:09 AM Pauline Good wrote: Reason for CRM: pt need a list of all her shots she had done and stated to please mail it to her

## 2019-02-15 NOTE — Telephone Encounter (Signed)
PA initiated via Covermymeds; KEY: [id number]. Awaiting determination.

## 2019-02-15 NOTE — Telephone Encounter (Signed)
Immunization record mailed. 

## 2019-02-15 NOTE — Telephone Encounter (Signed)
PA approved. Effective from 02/15/2019 through 02/15/2020

## 2019-02-16 MED ORDER — SIMVASTATIN 40 MG PO TABS: 40.0000 mg | ORAL_TABLET | Freq: Every day | ORAL | 2 refills | Status: AC

## 2019-02-16 NOTE — Addendum Note (Signed)
Addended byDamita Dunnings D on: 02/16/2019 02:59 PM   Modules accepted: Orders

## 2019-02-27 ENCOUNTER — Telehealth: Payer: Self-pay | Admitting: Emergency Medicine

## 2019-02-27 NOTE — Telephone Encounter (Signed)
Will route to myself to follow up on. 

## 2019-02-28 NOTE — Telephone Encounter (Signed)
Paperwork has been received and placed in RB's sign folder.

## 2019-03-03 NOTE — Telephone Encounter (Signed)
This paperwork was signed by Dr. Lamonte Sakai and will be faxed and mailed today. Nothing further is needed.

## 2019-03-03 NOTE — Telephone Encounter (Signed)
Pt.notified

## 2019-03-07 ENCOUNTER — Telehealth: Payer: Self-pay

## 2019-03-07 DIAGNOSIS — M818 Other osteoporosis without current pathological fracture: Secondary | ICD-10-CM

## 2019-03-07 NOTE — Telephone Encounter (Signed)
Copied from [place] [id number]. Topic: Referral - Request for Referral >> Mar 07, 2019  2:18 PM Scherrie Gerlach wrote: Pt request order for bone density sent to [facility]  [phone] [address] [phone]

## 2019-03-07 NOTE — Telephone Encounter (Signed)
Order placed

## 2019-03-08 ENCOUNTER — Ambulatory Visit: Payer: Medicare Other

## 2019-03-13 ENCOUNTER — Telehealth: Payer: Self-pay | Admitting: Emergency Medicine

## 2019-03-13 NOTE — Telephone Encounter (Signed)
Spoke with the pt  She states needing a letter written and signed by Dr Lamonte Sakai stating that she has COPD and this puts her at high risk of developing covid 73 if she is around large crowds  She states this is so she can qualify for curbside voting  Please advise, thanks

## 2019-03-13 NOTE — Telephone Encounter (Signed)
OK to write a letter that states that she is a high risk patient, would be at high risk for hospitalization or death if she were to contract COVID-19. For that reason I recommend that she do curbside voting.

## 2019-03-14 ENCOUNTER — Encounter: Payer: Self-pay | Admitting: General Surgery

## 2019-03-14 NOTE — Telephone Encounter (Signed)
Called the patient and advised of response. Patient reconfirmed fax number. Letter created and printed for signature. Faxed to [phone]. Nothing further needed at this time.

## 2019-03-15 ENCOUNTER — Telehealth: Payer: Self-pay | Admitting: Emergency Medicine

## 2019-03-15 DIAGNOSIS — J449 Chronic obstructive pulmonary disease, unspecified: Secondary | ICD-10-CM | POA: Diagnosis not present

## 2019-03-15 NOTE — Telephone Encounter (Signed)
Spoke with the pt  She states that letter regarding curbside voting was faxed to her landline She is requesting that we fax this to her at [phone]  Letter faxed  Nothing further needed

## 2019-03-16 ENCOUNTER — Other Ambulatory Visit: Payer: Self-pay

## 2019-03-16 ENCOUNTER — Ambulatory Visit (INDEPENDENT_AMBULATORY_CARE_PROVIDER_SITE_OTHER): Payer: Medicare Other | Admitting: *Deleted

## 2019-03-16 DIAGNOSIS — M818 Other osteoporosis without current pathological fracture: Secondary | ICD-10-CM | POA: Diagnosis not present

## 2019-03-16 MED ORDER — DENOSUMAB 60 MG/ML ~~LOC~~ SOSY
60.0000 mg | PREFILLED_SYRINGE | Freq: Once | SUBCUTANEOUS | Status: AC
  Administered 2019-03-16: 60 mg via SUBCUTANEOUS

## 2019-03-16 NOTE — Progress Notes (Signed)
Patient came in today for Prolia injection per Dr .Larose Kells.  Prolia given in left arm and patient tolerated well.

## 2019-03-17 NOTE — Progress Notes (Signed)
Virtual Visit via Video Note  I connected with patient on 03/20/19 at 10:15 AM EDT by audio enabled telemedicine application and verified that I am speaking with the correct person using two identifiers.   THIS ENCOUNTER IS A VIRTUAL VISIT DUE TO COVID-19 - PATIENT WAS NOT SEEN IN THE OFFICE. PATIENT HAS CONSENTED TO VIRTUAL VISIT / TELEMEDICINE VISIT   Location of patient: home  Location of provider: office  I discussed the limitations of evaluation and management by telemedicine and the availability of in person appointments. The patient expressed understanding and agreed to proceed.   Subjective:   Helen Scott is a 68 y.o. female who presents for Medicare Annual (Subsequent) preventive examination.  Review of Systems:  Home Safety/Smoke Alarms: Feels safe in home. Smoke alarms in place.  Lives alone in [place]. Daughter comes every Sunday to help with groceries and cleaning.    Female:      Mammo- 05/10/18      Dexa scan- 02/15/19       CCS- 05/04/09    Objective:     Vitals: BP 110/89 Comment: pt reported all vitals  Temp (!) 97.1 F (36.2 C) (Oral)   SpO2 96%   There is no height or weight on file to calculate BMI.  Advanced Directives 03/20/2019 05/21/2017  Does Patient Have a Medical Advance Directive? No No  Would patient like information on creating a medical advance directive? No - Patient declined Yes (MAU/Ambulatory/Procedural Areas - Information given)    Tobacco Social History   Tobacco Use  Smoking Status Former Smoker  . Packs/day: 1.50  . Years: 40.00  . Pack years: 60.00  . Types: Cigarettes  . Quit date: 05/25/2005  . Years since quitting: 13.8  Smokeless Tobacco Never Used  Tobacco Comment   1 ppd started at age 53     Counseling given: Not Answered Comment: 1 ppd started at age 5   Clinical Intake Pain : No/denies pain    Past Medical History:  Diagnosis Date  . CVA (cerebral infarction) 06/1999  . Emphysema    FEV1 0.49, FEV1%  27 on 5/10  . Hyperlipidemia   . OSTEOPENIA 03/27/2009  . PUD (peptic ulcer disease)     s/p partial gastrectomy 1985.     Past Surgical History:  Procedure Laterality Date  . BREAST BIOPSY  80s   R breast   . PARTIAL GASTRECTOMY      s/p partial gastrectomy 1985.    . TUBAL LIGATION  1980s   Family History  Problem Relation Age of Onset  . Diabetes Father 68  . Heart disease Father        MI at age 9s  . Prostate cancer Father        dx in his 44s  . Breast cancer Maternal Aunt        aunt, great aunt  and 2 first cousins  . Hypertension Neg Hx   . Stroke Neg Hx   . Colon cancer Neg Hx    Social History   Socioeconomic History  . Marital status: Single    Spouse name: Not on file  . Number of children: 2  . Years of education: Not on file  . Highest education level: Not on file  Occupational History  . Occupation: disability,previously worked in Therapist, art.    Employer: [employer]  Social Needs  . Financial resource strain: Not on file  . Food insecurity    Worry: Not on file  Inability: Not on file  . Transportation needs    Medical: Not on file    Non-medical: Not on file  Tobacco Use  . Smoking status: Former Smoker    Packs/day: 1.50    Years: 40.00    Pack years: 60.00    Types: Cigarettes    Quit date: 05/25/2005    Years since quitting: 13.8  . Smokeless tobacco: Never Used  . Tobacco comment: 1 ppd started at age 78  Substance and Sexual Activity  . Alcohol use: No    Alcohol/week: 0.0 standard drinks    Comment: rarely  . Drug use: No  . Sexual activity: Never  Lifestyle  . Physical activity    Days per week: Not on file    Minutes per session: Not on file  . Stress: Not on file  Relationships  . Social Herbalist on phone: Not on file    Gets together: Not on file    Attends religious service: Not on file    Active member of club or organization: Not on file    Attends meetings of clubs or organizations: Not on  file    Relationship status: Not on file  Other Topics Concern  . Not on file  Social History Narrative   one living child, Blair Promise, lives in [place] one child.    Single, lives by self, drives; ADLs getting limited     Outpatient Encounter Medications as of 03/20/2019  Medication Sig  . albuterol (PROVENTIL HFA;VENTOLIN HFA) 108 (90 Base) MCG/ACT inhaler Inhale 1-2 puffs into the lungs every 4 (four) hours as needed for wheezing or shortness of breath.  . ALPRAZolam (XANAX) 0.25 MG tablet TAKE 1 TABLET(0.25 MG) BY MOUTH TWICE DAILY AS NEEDED FOR ANXIETY OR SLEEP  . azithromycin (ZITHROMAX) 250 MG tablet TAKE 1 TABLET BY MOUTH DAILY.  . budesonide-formoterol (SYMBICORT) 160-4.5 MCG/ACT inhaler Inhale 2 puffs into the lungs 2 (two) times daily.  . clopidogrel (PLAVIX) 75 MG tablet Take 1 tablet (75 mg total) by mouth daily.  . Coral Calcium 1000 (390 Ca) MG TABS Take 1000mg  daily  . denosumab (PROLIA) 60 MG/ML SOSY injection Inject 60 mg into the skin every 6 (six) months.  . escitalopram (LEXAPRO) 10 MG tablet Take 1 tablet (10 mg total) by mouth daily.  . Ferrous Sulfate (IRON) 325 (65 Fe) MG TABS Take 1 tablet (325 mg total) by mouth every other day.  . ipratropium-albuterol (DUONEB) 0.5-2.5 (3) MG/3ML SOLN Take 3 mLs by nebulization every 6 (six) hours as needed.  Marland Kitchen Respiratory Therapy Supplies (FLUTTER) DEVI Use as directed  . simvastatin (ZOCOR) 40 MG tablet Take 1 tablet (40 mg total) by mouth at bedtime.  Marland Kitchen tiotropium (SPIRIVA HANDIHALER) 18 MCG inhalation capsule INHALE CONTENTS OF 1 CAPSULE ONCE DAILY USING HANDIHALER  . pantoprazole (PROTONIX) 40 MG tablet Take 40 mg by mouth daily.   No facility-administered encounter medications on file as of 03/20/2019.     Activities of Daily Living In your present state of health, do you have any difficulty performing the following activities: 05/30/2018  Hearing? N  Vision? N  Difficulty concentrating or making decisions? N   Walking or climbing stairs? Y  Dressing or bathing? N  Doing errands, shopping? N  Some recent data might be hidden    Patient Care Team: Colon Branch, MD as PCP - General Meda Coffee., MD as Consulting Physician (Obstetrics and Gynecology) Juanito Doom, MD  as Consulting Physician (Pulmonary Disease) Erroll Luna, MD as Consulting Physician (General Surgery)    Assessment:   This is a routine wellness examination for Zakirah. Physical assessment deferred to PCP.  Exercise Activities and Dietary recommendations   Diet (meal preparation, eat out, water intake, caffeinated beverages, dairy products, fruits and vegetables): 24 hr recall Breakfast: egg sandwich and donut Lunch: ham sandwich and chips Dinner:  Cheeseburger pie, potatoes, biscuit  Goals    . Restart exercising regimen (pt-stated)       Fall Risk Fall Risk  03/20/2019 05/30/2018 05/21/2017 04/28/2016 12/03/2015  Falls in the past year? 0 0 No No No  Number falls in past yr: 0 - - - -  Injury with Fall? 0 - - - -    Depression Screen PHQ 2/9 Scores 03/20/2019 02/14/2019 05/30/2018 04/14/2018  PHQ - 2 Score 0 1 4 0  PHQ- 9 Score - 5 12 0  Exception Documentation - - - -     Cognitive Function Ad8 score reviewed for issues:  Issues making decisions:no  Less interest in hobbies / activities:no  Repeats questions, stories (family complaining):no  Trouble using ordinary gadgets (microwave, computer, phone):no  Forgets the month or year: no  Mismanaging finances: no  Remembering appts:no  Daily problems with thinking and/or memory:no Ad8 score is=0   MMSE - Mini Mental State Exam 05/21/2017  Orientation to time 5  Orientation to Place 5  Registration 3  Attention/ Calculation 5  Recall 3  Language- name 2 objects 2  Language- repeat 1  Language- follow 3 step command 3  Language- read & follow direction 1  Write a sentence 1  Copy design 1  Total score 30        Immunization History   Administered Date(s) Administered  . Fluad Quad(high Dose 65+) 02/14/2019  . Influenza Split 02/03/2012  . Influenza Whole 03/11/2007, 02/20/2008, 02/11/2010, 01/28/2011  . Influenza, High Dose Seasonal PF 02/12/2015, 02/10/2016, 02/18/2017, 02/09/2018  . Influenza,inj,Quad PF,6+ Mos 02/02/2013, 02/02/2014  . Pneumococcal Conjugate-13 12/08/2013, 03/10/2014  . Pneumococcal Polysaccharide-23 11/08/2003, 02/18/2009, 12/03/2015  . Td 03/11/2007, 10/15/2017  . Zoster 02/21/2010   Screening Tests Health Maintenance  Topic Date Due  . COLONOSCOPY  05/05/2019  . MAMMOGRAM  05/11/2019  . DEXA SCAN  02/14/2021  . TETANUS/TDAP  10/16/2027  . INFLUENZA VACCINE  Completed  . Hepatitis C Screening  Completed  . PNA vac Low Risk Adult  Completed      Plan:   See you next year.  Continue to eat heart healthy diet (full of fruits, vegetables, whole grains, lean protein, water--limit salt, fat, and sugar intake) and increase physical activity as tolerated.  Continue doing brain stimulating activities (puzzles, reading, adult coloring books, staying active) to keep memory sharp.   Bring a copy of your living will and/or healthcare power of attorney to your next office visit.    I have personally reviewed and noted the following in the patient's chart:   . Medical and social history . Use of alcohol, tobacco or illicit drugs  . Current medications and supplements . Functional ability and status . Nutritional status . Physical activity . Advanced directives . List of other physicians . Hospitalizations, surgeries, and ER visits in previous 12 months . Vitals . Screenings to include cognitive, depression, and falls . Referrals and appointments  In addition, I have reviewed and discussed with patient certain preventive protocols, quality metrics, and best practice recommendations. A written personalized care plan for preventive services  as well as general preventive health recommendations  were provided to patient.     Shela Nevin, [place]  03/20/2019

## 2019-03-17 NOTE — Telephone Encounter (Signed)
Hard copy of letter mailed to the patient. Nothing further needed at this time.

## 2019-03-20 ENCOUNTER — Ambulatory Visit (INDEPENDENT_AMBULATORY_CARE_PROVIDER_SITE_OTHER): Payer: Medicare Other | Admitting: *Deleted

## 2019-03-20 ENCOUNTER — Telehealth: Payer: Self-pay

## 2019-03-20 ENCOUNTER — Other Ambulatory Visit: Payer: Self-pay

## 2019-03-20 ENCOUNTER — Encounter: Payer: Self-pay | Admitting: *Deleted

## 2019-03-20 VITALS — BP 110/89 | Temp 97.1°F

## 2019-03-20 DIAGNOSIS — Z0000 Encounter for general adult medical examination without abnormal findings: Secondary | ICD-10-CM | POA: Diagnosis not present

## 2019-03-20 DIAGNOSIS — Z1231 Encounter for screening mammogram for malignant neoplasm of breast: Secondary | ICD-10-CM

## 2019-03-20 DIAGNOSIS — M818 Other osteoporosis without current pathological fracture: Secondary | ICD-10-CM

## 2019-03-20 NOTE — Telephone Encounter (Signed)
Noted, thanks!

## 2019-03-20 NOTE — Telephone Encounter (Signed)
Copied from [place] [id number]. Topic: Referral - Request for Referral >> Mar 07, 2019  2:18 PM Scherrie Gerlach wrote: Pt request order for bone density sent to [facility]  [address] [phone] >> Mar 20, 2019 10:38 AM Carolyn Stare wrote:   Pt call to say she has spoken with [facility] and they told her they have not received the referral for a bone density or mamo pt would like  Call back   [facility]  [phone] [phone]

## 2019-03-20 NOTE — Telephone Encounter (Signed)
I thought I had placed orders. Chart reviewed- orders have now been placed. Drue Dun- can you send orders over please. Thank you.

## 2019-03-20 NOTE — Telephone Encounter (Signed)
Orders faxed to [phone]

## 2019-03-20 NOTE — Patient Instructions (Signed)
See you next year.  Continue to eat heart healthy diet (full of fruits, vegetables, whole grains, lean protein, water--limit salt, fat, and sugar intake) and increase physical activity as tolerated.  Continue doing brain stimulating activities (puzzles, reading, adult coloring books, staying active) to keep memory sharp.   Bring a copy of your living will and/or healthcare power of attorney to your next office visit.   Helen Scott , Thank you for taking time to come for your Medicare Wellness Visit. I appreciate your ongoing commitment to your health goals. Please review the following plan we discussed and let me know if I can assist you in the future.   These are the goals we discussed: Goals    . Restart exercising regimen (pt-stated)       This is a list of the screening recommended for you and due dates:  Health Maintenance  Topic Date Due  . Colon Cancer Screening  05/05/2019  . Mammogram  05/11/2019  . DEXA scan (bone density measurement)  02/14/2021  . Tetanus Vaccine  10/16/2027  . Flu Shot  Completed  .  Hepatitis C: One time screening is recommended by Center for Disease Control  (CDC) for  adults born from [birth year] through 1966.   Completed  . Pneumonia vaccines  Completed    Preventive Care 37 Years and Older, Female Preventive care refers to lifestyle choices and visits with your health care provider that can promote health and wellness. This includes:  A yearly physical exam. This is also called an annual well check.  Regular dental and eye exams.  Immunizations.  Screening for certain conditions.  Healthy lifestyle choices, such as diet and exercise. What can I expect for my preventive care visit? Physical exam Your health care provider will check:  Height and weight. These may be used to calculate body mass index (BMI), which is a measurement that tells if you are at a healthy weight.  Heart rate and blood pressure.  Your skin for abnormal spots.  Counseling Your health care provider may ask you questions about:  Alcohol, tobacco, and drug use.  Emotional well-being.  Home and relationship well-being.  Sexual activity.  Eating habits.  History of falls.  Memory and ability to understand (cognition).  Work and work Statistician.  Pregnancy and menstrual history. What immunizations do I need?  Influenza (flu) vaccine  This is recommended every year. Tetanus, diphtheria, and pertussis (Tdap) vaccine  You may need a Td booster every 10 years. Varicella (chickenpox) vaccine  You may need this vaccine if you have not already been vaccinated. Zoster (shingles) vaccine  You may need this after age 36. Pneumococcal conjugate (PCV13) vaccine  One dose is recommended after age 66. Pneumococcal polysaccharide (PPSV23) vaccine  One dose is recommended after age 97. Measles, mumps, and rubella (MMR) vaccine  You may need at least one dose of MMR if you were born in 1957 or later. You may also need a second dose. Meningococcal conjugate (MenACWY) vaccine  You may need this if you have certain conditions. Hepatitis A vaccine  You may need this if you have certain conditions or if you travel or work in places where you may be exposed to hepatitis A. Hepatitis B vaccine  You may need this if you have certain conditions or if you travel or work in places where you may be exposed to hepatitis B. Haemophilus influenzae type b (Hib) vaccine  You may need this if you have certain conditions. You may receive  vaccines as individual doses or as more than one vaccine together in one shot (combination vaccines). Talk with your health care provider about the risks and benefits of combination vaccines. What tests do I need? Blood tests  Lipid and cholesterol levels. These may be checked every 5 years, or more frequently depending on your overall health.  Hepatitis C test.  Hepatitis B test. Screening  Lung cancer screening.  You may have this screening every year starting at age 60 if you have a 30-pack-year history of smoking and currently smoke or have quit within the past 15 years.  Colorectal cancer screening. All adults should have this screening starting at age 15 and continuing until age 35. Your health care provider may recommend screening at age 72 if you are at increased risk. You will have tests every 1-10 years, depending on your results and the type of screening test.  Diabetes screening. This is done by checking your blood sugar (glucose) after you have not eaten for a while (fasting). You may have this done every 1-3 years.  Mammogram. This may be done every 1-2 years. Talk with your health care provider about how often you should have regular mammograms.  BRCA-related cancer screening. This may be done if you have a family history of breast, ovarian, tubal, or peritoneal cancers. Other tests  Sexually transmitted disease (STD) testing.  Bone density scan. This is done to screen for osteoporosis. You may have this done starting at age 72. Follow these instructions at home: Eating and drinking  Eat a diet that includes fresh fruits and vegetables, whole grains, lean protein, and low-fat dairy products. Limit your intake of foods with high amounts of sugar, saturated fats, and salt.  Take vitamin and mineral supplements as recommended by your health care provider.  Do not drink alcohol if your health care provider tells you not to drink.  If you drink alcohol: ? Limit how much you have to 0-1 drink a day. ? Be aware of how much alcohol is in your drink. In the [place] one drink equals one 12 oz bottle of beer (355 mL), one 5 oz glass of wine (148 mL), or one 1 oz glass of hard liquor (44 mL). Lifestyle  Take daily care of your teeth and gums.  Stay active. Exercise for at least 30 minutes on 5 or more days each week.  Do not use any products that contain nicotine or tobacco, such as  cigarettes, e-cigarettes, and chewing tobacco. If you need help quitting, ask your health care provider.  If you are sexually active, practice safe sex. Use a condom or other form of protection in order to prevent STIs (sexually transmitted infections).  Talk with your health care provider about taking a low-dose aspirin or statin. What's next?  Go to your health care provider once a year for a well check visit.  Ask your health care provider how often you should have your eyes and teeth checked.  Stay up to date on all vaccines. This information is not intended to replace advice given to you by your health care provider. Make sure you discuss any questions you have with your health care provider. Document Released: 06/07/2015 Document Revised: 05/05/2018 Document Reviewed: 05/05/2018 Elsevier Patient Education  2020 [employer].

## 2019-04-02 ENCOUNTER — Other Ambulatory Visit: Payer: Self-pay | Admitting: Pulmonary Disease

## 2019-04-15 DIAGNOSIS — J449 Chronic obstructive pulmonary disease, unspecified: Secondary | ICD-10-CM | POA: Diagnosis not present

## 2019-05-02 ENCOUNTER — Encounter: Payer: Self-pay | Admitting: Emergency Medicine

## 2019-05-02 ENCOUNTER — Ambulatory Visit (INDEPENDENT_AMBULATORY_CARE_PROVIDER_SITE_OTHER): Payer: Medicare Other | Admitting: Emergency Medicine

## 2019-05-02 ENCOUNTER — Other Ambulatory Visit: Payer: Self-pay

## 2019-05-02 VITALS — BP 128/70 | HR 110 | Ht 62.0 in | Wt 97.8 lb

## 2019-05-02 DIAGNOSIS — J9611 Chronic respiratory failure with hypoxia: Secondary | ICD-10-CM

## 2019-05-02 DIAGNOSIS — J432 Centrilobular emphysema: Secondary | ICD-10-CM | POA: Diagnosis not present

## 2019-05-02 DIAGNOSIS — J9612 Chronic respiratory failure with hypercapnia: Secondary | ICD-10-CM | POA: Diagnosis not present

## 2019-05-02 MED ORDER — TRELEGY ELLIPTA 100-62.5-25 MCG/INH IN AEPB: 1.0000 | INHALATION_SPRAY | Freq: Every day | RESPIRATORY_TRACT | 0 refills | Status: DC

## 2019-05-02 NOTE — Progress Notes (Signed)
Subjective:    Patient ID: Helen Scott, female    DOB: [birth year], 68 y.o.   MRN: [id number]  HPI 68 year old former smoker who has been followed in our office first by Dr. Gwenette Greet and then by Dr. Lake Bells for hypoxemic respiratory failure and severe COPD.  She has spirometry from 2010 that showed an FEV1 of 0.49 L.  She uses oxygen at 4 L/min.  Her current bronchodilator regimen includes Spiriva, Symbicort, albuterol which she uses approximately 2x a day.  She was started on suppressive azithromycin in 2019.  She has daily cough, has to clear mucous every afternoon. She does have some occasional desats with exertion. She was admitted for a PNA in July to [facility] - had never been admitted for COPD before. COVID negative.   ROV 05/02/2019 --this is a follow-up visit for 68 year old woman with severe COPD and associated chronic hypoxemic respiratory failure.  She has been managed on suppressive azithromycin.  Bronchodilator regimen includes Symbicort and Spiriva. She is having significant exertional SOB - even with cooking at home. She has not ben going to the hospital fitness center since [place] isolation. She is interested in [facility] PT if possible.    Review of Systems  Past Medical History:  Diagnosis Date  . CVA (cerebral infarction) 06/1999  . Emphysema    FEV1 0.49, FEV1% 27 on 5/10  . Hyperlipidemia   . OSTEOPENIA 03/27/2009  . PUD (peptic ulcer disease)     s/p partial gastrectomy 1985.       Family History  Problem Relation Age of Onset  . Diabetes Father 18  . Heart disease Father        MI at age 68s  . Prostate cancer Father        dx in his 70s  . Breast cancer Maternal Aunt        aunt, great aunt  and 2 first cousins  . Hypertension Neg Hx   . Stroke Neg Hx   . Colon cancer Neg Hx      Social History   Socioeconomic History  . Marital status: Single    Spouse name: Not on file  . Number of children: 2  . Years of education: Not on file  . Highest education level: Not  on file  Occupational History  . Occupation: disability,previously worked in Therapist, art.    Employer: [employer]  Social Needs  . Financial resource strain: Not on file  . Food insecurity    Worry: Not on file    Inability: Not on file  . Transportation needs    Medical: Not on file    Non-medical: Not on file  Tobacco Use  . Smoking status: Former Smoker    Packs/day: 1.50    Years: 40.00    Pack years: 60.00    Types: Cigarettes    Quit date: 05/25/2005    Years since quitting: 13.9  . Smokeless tobacco: Never Used  . Tobacco comment: 1 ppd started at age 41  Substance and Sexual Activity  . Alcohol use: No    Alcohol/week: 0.0 standard drinks    Comment: rarely  . Drug use: No  . Sexual activity: Never  Lifestyle  . Physical activity    Days per week: Not on file    Minutes per session: Not on file  . Stress: Not on file  Relationships  . Social Herbalist on phone: Not on file    Gets together: Not on  file    Attends religious service: Not on file    Active member of club or organization: Not on file    Attends meetings of clubs or organizations: Not on file    Relationship status: Not on file  . Intimate partner violence    Fear of current or ex partner: Not on file    Emotionally abused: Not on file    Physically abused: Not on file    Forced sexual activity: Not on file  Other Topics Concern  . Not on file  Social History Narrative   one living child, Blair Promise, lives in [place] one child.    Single, lives by self, drives; ADLs getting limited      Allergies  Allergen Reactions  . Adhesive [Tape]   . Latex Other (See Comments)    Only Latex Tape causes Rash and abrasive      Outpatient Medications Prior to Visit  Medication Sig Dispense Refill  . albuterol (PROVENTIL HFA;VENTOLIN HFA) 108 (90 Base) MCG/ACT inhaler Inhale 1-2 puffs into the lungs every 4 (four) hours as needed for wheezing or shortness of breath. 18 g 5  .  ALPRAZolam (XANAX) 0.25 MG tablet TAKE 1 TABLET(0.25 MG) BY MOUTH TWICE DAILY AS NEEDED FOR ANXIETY OR SLEEP 60 tablet 1  . azithromycin (ZITHROMAX) 250 MG tablet TAKE 1 TABLET BY MOUTH DAILY. 30 tablet 2  . clopidogrel (PLAVIX) 75 MG tablet Take 1 tablet (75 mg total) by mouth daily. 90 tablet 3  . Coral Calcium 1000 (390 Ca) MG TABS Take 1000mg  daily    . denosumab (PROLIA) 60 MG/ML SOSY injection Inject 60 mg into the skin every 6 (six) months. 1 mL 0  . escitalopram (LEXAPRO) 10 MG tablet Take 1 tablet (10 mg total) by mouth daily. 90 tablet 0  . Ferrous Sulfate (IRON) 325 (65 Fe) MG TABS Take 1 tablet (325 mg total) by mouth every other day.    . ipratropium-albuterol (DUONEB) 0.5-2.5 (3) MG/3ML SOLN Take 3 mLs by nebulization every 6 (six) hours as needed. 360 mL 5  . pantoprazole (PROTONIX) 40 MG tablet Take 40 mg by mouth daily.    Marland Kitchen Respiratory Therapy Supplies (FLUTTER) DEVI Use as directed 1 each 0  . simvastatin (ZOCOR) 40 MG tablet Take 1 tablet (40 mg total) by mouth at bedtime. 90 tablet 2  . budesonide-formoterol (SYMBICORT) 160-4.5 MCG/ACT inhaler Inhale 2 puffs into the lungs 2 (two) times daily. 1 Inhaler 6  . tiotropium (SPIRIVA HANDIHALER) 18 MCG inhalation capsule INHALE CONTENTS OF 1 CAPSULE ONCE DAILY USING HANDIHALER 150 capsule 5   No facility-administered medications prior to visit.         Objective:   Physical Exam Vitals:   05/02/19 1412  BP: 128/70  Pulse: (!) 110  SpO2: 96%  Weight: 97 lb 12.8 oz (44.4 kg)  Height: 5\' 2"  (1.575 m)   Gen: Pleasant, very thin, in no distress,  normal affect  ENT: No lesions,  mouth clear,  oropharynx clear, no postnasal drip, dentures in place  Neck: No JVD, no stridor, strong voice  Lungs: No use of accessory muscles, no crackles or wheezing on normal respiration, no wheeze on forced expiration  Cardiovascular: RRR, heart sounds normal, no murmur or gallops, no peripheral edema  Musculoskeletal: No deformities, no  cyanosis or clubbing  Neuro: alert, awake, non focal  Skin: Warm, no lesions or rash      Assessment & Plan:  COPD (chronic obstructive  pulmonary disease) ([place]) Decreased functional capacity since our last visit.  Some of this may be deconditioning.  She had to stop doing her maintenance pulmonary rehab program when Cov[place] isolation began.  We will try temporarily stopping your Spiriva and Symbicort.  Start Trelegy 1 inhalation once daily.  Keep track of when the benefit from this medication, if so we will continue it going forward. Call us to let us know.  Keep your albuterol available to use 2 puffs if needed for shortness of breath, chest tightness, wheezing. Continue azithromycin once daily. We will try to get you back to pulmonary rehab as soon as it is feasible given the social isolation measures in place.  In the meantime we will refer you for home health physical therapy to see if this can help with your functional capacity. We talked a little bit about chronic prednisone today.  I would like to hold off on this for now.  I hope that we will be able to improve her exertional capacity by reintroducing observed exercise. Flu shot and pneumonia shots up to date Follow with Dr Lamonte Sakai in 3 months or sooner if you have any problems.  CHRONIC RESPIRATORY FAILURE Continue your oxygen at all times as you have been using it.   Baltazar Apo, MD, PhD 05/02/2019, 5:57 PM [facility] [phone] or if no answer [phone]

## 2019-05-02 NOTE — Assessment & Plan Note (Signed)
Decreased functional capacity since our last visit.  Some of this may be deconditioning.  She had to stop doing her maintenance pulmonary rehab program when Covid isolation began.  We will try temporarily stopping your Spiriva and Symbicort.  Start Trelegy 1 inhalation once daily.  Keep track of when the benefit from this medication, if so we will continue it going forward. Call us to let us know.  Keep your albuterol available to use 2 puffs if needed for shortness of breath, chest tightness, wheezing. Continue azithromycin once daily. We will try to get you back to pulmonary rehab as soon as it is feasible given the social isolation measures in place.  In the meantime we will refer you for home health physical therapy to see if this can help with your functional capacity. We talked a little bit about chronic prednisone today.  I would like to hold off on this for now.  I hope that we will be able to improve her exertional capacity by reintroducing observed exercise. Flu shot and pneumonia shots up to date Follow with Dr Lamonte Sakai in 3 months or sooner if you have any problems.

## 2019-05-02 NOTE — Assessment & Plan Note (Signed)
Continue your oxygen at all times as you have been using it. 

## 2019-05-02 NOTE — Patient Instructions (Addendum)
We will try temporarily stopping your Spiriva and Symbicort.  Start Trelegy 1 inhalation once daily.  Keep track of when the benefit from this medication, if so we will continue it going forward. Call us to let us know.  Keep your albuterol available to use 2 puffs if needed for shortness of breath, chest tightness, wheezing. Continue azithromycin once daily. Continue your oxygen at all times as you have been using it. We will try to get you back to pulmonary rehab as soon as it is feasible given the social isolation measures in place.  In the meantime we will refer you for home health physical therapy to see if this can help with your functional capacity. We talked a little bit about chronic prednisone today.  I would like to hold off on this for now.  I hope that we will be able to improve her exertional capacity by reintroducing observed exercise. Flu shot and pneumonia shots up to date Follow with Dr Lamonte Sakai in 3 months or sooner if you have any problems.

## 2019-05-04 ENCOUNTER — Telehealth: Payer: Self-pay | Admitting: Emergency Medicine

## 2019-05-04 NOTE — Telephone Encounter (Signed)
I called and spoke with the and she was concerned about whether or not her insurance would cover since she was in pulmonary rehab. I advised her that [facility] would establish everything with her insurance and let her know if theres a problem. She verbalized understanding.

## 2019-05-05 ENCOUNTER — Other Ambulatory Visit: Payer: Self-pay | Admitting: Internal Medicine

## 2019-05-05 ENCOUNTER — Other Ambulatory Visit: Payer: Self-pay | Admitting: Pulmonary Disease

## 2019-05-15 DIAGNOSIS — J449 Chronic obstructive pulmonary disease, unspecified: Secondary | ICD-10-CM | POA: Diagnosis not present

## 2019-05-16 ENCOUNTER — Telehealth: Payer: Self-pay | Admitting: Emergency Medicine

## 2019-05-16 NOTE — Telephone Encounter (Signed)
Called and spoke with pt letting her know that we do not know when the vaccine will be avail to patients. Pt verbalized understanding. Nothing further needed.

## 2019-05-31 ENCOUNTER — Telehealth: Payer: Self-pay | Admitting: Emergency Medicine

## 2019-05-31 MED ORDER — TRELEGY ELLIPTA 100-62.5-25 MCG/INH IN AEPB: 1.0000 | INHALATION_SPRAY | Freq: Every day | RESPIRATORY_TRACT | 12 refills | Status: DC

## 2019-05-31 MED ORDER — TRELEGY ELLIPTA 100-62.5-25 MCG/INH IN AEPB: 1.0000 | INHALATION_SPRAY | Freq: Every day | RESPIRATORY_TRACT | 3 refills | Status: AC

## 2019-05-31 NOTE — Telephone Encounter (Signed)
Rx of trelegy sent to pt's pharmacy as a 90-day supply. Called and spoke with pt letting her know this had been done and she verbalized understanding. Nothing further needed.

## 2019-05-31 NOTE — Telephone Encounter (Signed)
Rx has been sent in. 

## 2019-06-15 DIAGNOSIS — J449 Chronic obstructive pulmonary disease, unspecified: Secondary | ICD-10-CM | POA: Diagnosis not present

## 2019-07-06 ENCOUNTER — Other Ambulatory Visit: Payer: Self-pay

## 2019-07-06 ENCOUNTER — Ambulatory Visit (INDEPENDENT_AMBULATORY_CARE_PROVIDER_SITE_OTHER): Payer: Medicare Other | Admitting: Family Medicine

## 2019-07-06 ENCOUNTER — Encounter: Payer: Self-pay | Admitting: Family Medicine

## 2019-07-06 DIAGNOSIS — G9341 Metabolic encephalopathy: Secondary | ICD-10-CM | POA: Diagnosis not present

## 2019-07-06 DIAGNOSIS — J9622 Acute and chronic respiratory failure with hypercapnia: Secondary | ICD-10-CM | POA: Diagnosis not present

## 2019-07-06 DIAGNOSIS — Z903 Acquired absence of stomach [part of]: Secondary | ICD-10-CM | POA: Diagnosis not present

## 2019-07-06 DIAGNOSIS — R531 Weakness: Secondary | ICD-10-CM | POA: Diagnosis not present

## 2019-07-06 DIAGNOSIS — J9611 Chronic respiratory failure with hypoxia: Secondary | ICD-10-CM

## 2019-07-06 DIAGNOSIS — R58 Hemorrhage, not elsewhere classified: Secondary | ICD-10-CM | POA: Diagnosis not present

## 2019-07-06 DIAGNOSIS — M810 Age-related osteoporosis without current pathological fracture: Secondary | ICD-10-CM | POA: Diagnosis not present

## 2019-07-06 DIAGNOSIS — F329 Major depressive disorder, single episode, unspecified: Secondary | ICD-10-CM | POA: Diagnosis not present

## 2019-07-06 DIAGNOSIS — E7220 Disorder of urea cycle metabolism, unspecified: Secondary | ICD-10-CM | POA: Diagnosis not present

## 2019-07-06 DIAGNOSIS — R410 Disorientation, unspecified: Secondary | ICD-10-CM | POA: Diagnosis not present

## 2019-07-06 DIAGNOSIS — Z20822 Contact with and (suspected) exposure to covid-19: Secondary | ICD-10-CM | POA: Diagnosis not present

## 2019-07-06 DIAGNOSIS — F419 Anxiety disorder, unspecified: Secondary | ICD-10-CM | POA: Diagnosis not present

## 2019-07-06 DIAGNOSIS — Z66 Do not resuscitate: Secondary | ICD-10-CM | POA: Diagnosis not present

## 2019-07-06 DIAGNOSIS — E1165 Type 2 diabetes mellitus with hyperglycemia: Secondary | ICD-10-CM | POA: Diagnosis not present

## 2019-07-06 DIAGNOSIS — K625 Hemorrhage of anus and rectum: Secondary | ICD-10-CM | POA: Diagnosis not present

## 2019-07-06 DIAGNOSIS — Z87891 Personal history of nicotine dependence: Secondary | ICD-10-CM | POA: Diagnosis not present

## 2019-07-06 DIAGNOSIS — Z9981 Dependence on supplemental oxygen: Secondary | ICD-10-CM | POA: Diagnosis not present

## 2019-07-06 DIAGNOSIS — R069 Unspecified abnormalities of breathing: Secondary | ICD-10-CM | POA: Diagnosis not present

## 2019-07-06 DIAGNOSIS — R32 Unspecified urinary incontinence: Secondary | ICD-10-CM | POA: Diagnosis not present

## 2019-07-06 DIAGNOSIS — J9612 Chronic respiratory failure with hypercapnia: Secondary | ICD-10-CM

## 2019-07-06 DIAGNOSIS — Z515 Encounter for palliative care: Secondary | ICD-10-CM | POA: Diagnosis not present

## 2019-07-06 DIAGNOSIS — J441 Chronic obstructive pulmonary disease with (acute) exacerbation: Secondary | ICD-10-CM | POA: Diagnosis not present

## 2019-07-06 DIAGNOSIS — J9602 Acute respiratory failure with hypercapnia: Secondary | ICD-10-CM | POA: Diagnosis not present

## 2019-07-06 DIAGNOSIS — J9621 Acute and chronic respiratory failure with hypoxia: Secondary | ICD-10-CM | POA: Diagnosis not present

## 2019-07-06 DIAGNOSIS — R0602 Shortness of breath: Secondary | ICD-10-CM | POA: Diagnosis not present

## 2019-07-06 DIAGNOSIS — Z8673 Personal history of transient ischemic attack (TIA), and cerebral infarction without residual deficits: Secondary | ICD-10-CM | POA: Diagnosis not present

## 2019-07-06 DIAGNOSIS — D638 Anemia in other chronic diseases classified elsewhere: Secondary | ICD-10-CM | POA: Diagnosis not present

## 2019-07-06 DIAGNOSIS — E785 Hyperlipidemia, unspecified: Secondary | ICD-10-CM | POA: Diagnosis not present

## 2019-07-06 DIAGNOSIS — E874 Mixed disorder of acid-base balance: Secondary | ICD-10-CM | POA: Diagnosis not present

## 2019-07-06 MED ORDER — GLUCAGON (RDNA) 1 MG IJ KIT
1.00 | PACK | INTRAMUSCULAR | Status: DC
Start: ? — End: 2019-07-06

## 2019-07-06 MED ORDER — INSULIN LISPRO 100 UNIT/ML ~~LOC~~ SOLN
0.00 | SUBCUTANEOUS | Status: DC
Start: 2019-07-07 — End: 2019-07-06

## 2019-07-06 MED ORDER — ATORVASTATIN CALCIUM 40 MG PO TABS
20.00 | ORAL_TABLET | ORAL | Status: DC
Start: 2019-07-11 — End: 2019-07-06

## 2019-07-06 MED ORDER — PREDNISONE 20 MG PO TABS
40.00 | ORAL_TABLET | ORAL | Status: DC
Start: 2019-07-07 — End: 2019-07-06

## 2019-07-06 MED ORDER — ALBUTEROL SULFATE HFA 108 (90 BASE) MCG/ACT IN AERS
2.00 | INHALATION_SPRAY | RESPIRATORY_TRACT | Status: DC
Start: ? — End: 2019-07-06

## 2019-07-06 MED ORDER — CLOPIDOGREL BISULFATE 75 MG PO TABS
75.00 | ORAL_TABLET | ORAL | Status: DC
Start: 2019-07-12 — End: 2019-07-06

## 2019-07-06 MED ORDER — GENERIC EXTERNAL MEDICATION
500.00 | Status: DC
Start: 2019-07-07 — End: 2019-07-06

## 2019-07-06 MED ORDER — GENERIC EXTERNAL MEDICATION
1.00 | Status: DC
Start: 2019-07-07 — End: 2019-07-06

## 2019-07-06 MED ORDER — ESCITALOPRAM OXALATE 5 MG PO TABS
5.00 | ORAL_TABLET | ORAL | Status: DC
Start: 2019-07-07 — End: 2019-07-06

## 2019-07-06 MED ORDER — GENERIC EXTERNAL MEDICATION
Status: DC
Start: 2019-07-11 — End: 2019-07-06

## 2019-07-06 MED ORDER — GLUCOSE 40 % PO GEL
15.00 | ORAL | Status: DC
Start: ? — End: 2019-07-06

## 2019-07-06 MED ORDER — DEXTROSE 10 % IV SOLN
125.00 | INTRAVENOUS | Status: DC
Start: ? — End: 2019-07-06

## 2019-07-06 NOTE — Progress Notes (Signed)
[facility] [address] [place], [place] [address] [phone]  Date:  07/06/2019   Name:  Helen Scott   DOB:  Feb 08, 1951   MRN:  [id number]  PCP:  Colon Branch, MD    Chief Complaint: No chief complaint on file.   History of Present Illness:  Helen Scott is a 69 y.o. very pleasant female patient who presents with the following:  Patient of Dr. Larose Kells whom I have not seen in the past.  Virtual visit today with concern of rectal leakage for 1 week and other symptoms  She has history of COPD on chronic oxygen, depression, CVA on Plavix.  She had a partial gastrectomy in the 80s due to peptic ulcer disease, gastric perforation Connected with patient via FaceTime video today.  Patient identity confirmed with 2 factors, she gives consent for virtual visit today  Per Dr Larose Kells last visit COPD/chronic respiratory failure:  Seen by pulmonary yesterday was recommended to continue her maintenance regimen including daily Zithromax.  Seems a stable check CBC. Hyperlipidemia: Currently on simvastatin, last LDL elevated, recheck labs today, if LDL still elevated consider switch to Lipitor Depression: Currently on Lexapro 10 mg and Xanax as needed.  Denies excessive sedation, symptoms controlled.  RF as needed Osteoporosis: Sent prescription for Prolia, she will bring the medication to the office and get injection. Preventive care: Flu shot today Social: Still lives by herself and do some driving, her daughter Judeen Hammans is here today, she helps her a lot. RTC 4 months   Pt and her sister Bethena Roys are present on the call today Her sister came in today to pick her up for an eye appt today and was concerned about her sister's condition She found her seeming disoriented, not dressed, seeming confused, not herself. She has had rectal bleeding for a week or so-did not tell anyone until today.  It is difficult to quantify how much bleeding she  is having She is not able to control her bladder either which is abnormal  She had episode of severe colon bleeding in 2019-she was admitted to [facility] in October 2019.  At that time she had rectal bleeding, was admitted with a hemoglobin of 9.9, she was evaluated by gastroenterology and improved with Protonix  Patient Active Problem List   Diagnosis Date Noted  . History of partial gastrectomy 03/13/2018  . Encounter for therapeutic drug monitoring 06/18/2016  . Dysphagia 12/17/2015  . Chest pain 12/17/2015  . Loss of weight 04/23/2015  . PCP NOTES >>>>> 02/12/2015  . Anxiety and depression 10/03/2014  . Annual physical exam 02/20/2011  . CHRONIC RESPIRATORY FAILURE 06/14/2009  . Osteoporosis 03/27/2009  . Hyperlipidemia 02/20/2008  . H/O: CVA (cerebrovascular accident) 10/14/2006  . COPD (chronic obstructive pulmonary disease) ([place]) 10/14/2006  . PUD, HX OF 10/14/2006    Past Medical History:  Diagnosis Date  . CVA (cerebral infarction) 06/1999  . Emphysema    FEV1 0.49, FEV1% 27 on 5/10  . Hyperlipidemia   . OSTEOPENIA 03/27/2009  . PUD (peptic ulcer disease)     s/p partial gastrectomy 1985.      Past Surgical History:  Procedure Laterality Date  . BREAST BIOPSY  80s   R breast   . PARTIAL GASTRECTOMY      s/p partial gastrectomy 1985.    . TUBAL LIGATION  1980s    Social History   Tobacco Use  . Smoking  status: Former Smoker    Packs/day: 1.50    Years: 40.00    Pack years: 60.00    Types: Cigarettes    Quit date: 05/25/2005    Years since quitting: 14.1  . Smokeless tobacco: Never Used  . Tobacco comment: 1 ppd started at age 68  Substance Use Topics  . Alcohol use: No    Alcohol/week: 0.0 standard drinks    Comment: rarely  . Drug use: No    Family History  Problem Relation Age of Onset  . Diabetes Father 42  . Heart disease Father        MI at age 13s  . Prostate cancer Father        dx in his 48s  . Breast cancer Maternal Aunt         aunt, great aunt  and 2 first cousins  . Hypertension Neg Hx   . Stroke Neg Hx   . Colon cancer Neg Hx     Allergies  Allergen Reactions  . Adhesive [Tape]   . Latex Other (See Comments)    Only Latex Tape causes Rash and abrasive     Medication list has been reviewed and updated.  Current Outpatient Medications on File Prior to Visit  Medication Sig Dispense Refill  . albuterol (PROVENTIL HFA;VENTOLIN HFA) 108 (90 Base) MCG/ACT inhaler Inhale 1-2 puffs into the lungs every 4 (four) hours as needed for wheezing or shortness of breath. 18 g 5  . ALPRAZolam (XANAX) 0.25 MG tablet TAKE 1 TABLET(0.25 MG) BY MOUTH TWICE DAILY AS NEEDED FOR ANXIETY OR SLEEP 60 tablet 1  . azithromycin (ZITHROMAX) 250 MG tablet TAKE 1 TABLET BY MOUTH DAILY. 30 tablet 2  . clopidogrel (PLAVIX) 75 MG tablet Take 1 tablet (75 mg total) by mouth daily. 90 tablet 3  . Coral Calcium 1000 (390 Ca) MG TABS Take 1000mg  daily    . denosumab (PROLIA) 60 MG/ML SOSY injection Inject 60 mg into the skin every 6 (six) months. 1 mL 0  . escitalopram (LEXAPRO) 10 MG tablet Take 1 tablet (10 mg total) by mouth daily. 90 tablet 0  . Ferrous Sulfate (IRON) 325 (65 Fe) MG TABS Take 1 tablet (325 mg total) by mouth every other day.    . Fluticasone-Umeclidin-Vilant (TRELEGY ELLIPTA) 100-62.5-25 MCG/INH AEPB Inhale 1 puff into the lungs daily. 90 each 3  . ipratropium-albuterol (DUONEB) 0.5-2.5 (3) MG/3ML SOLN Take 3 mLs by nebulization every 6 (six) hours as needed. 360 mL 5  . pantoprazole (PROTONIX) 40 MG tablet Take 40 mg by mouth daily.    Marland Kitchen Respiratory Therapy Supplies (FLUTTER) DEVI Use as directed 1 each 0  . simvastatin (ZOCOR) 40 MG tablet Take 1 tablet (40 mg total) by mouth at bedtime. 90 tablet 2   No current facility-administered medications on file prior to visit.    Review of Systems:  As per HPI- otherwise negative.   Physical Examination: There were no vitals filed for this visit. There were no vitals  filed for this visit. There is no height or weight on file to calculate BMI. Ideal Body Weight:    Patient observed over video today.  She is not very talkative, her sister gives most of the history.  Patient states she does not feel well, she has noted blood with bowel movements for about 1 week.  She feels weak  Assessment and Plan: Weakness  Rectal bleeding  History of partial gastrectomy  Chronic respiratory failure with hypoxia and hypercapnia (  [place])  Virtual visit today to evaluate patient with history of CVA, COPD on oxygen, partial gastrectomy due to peptic ulcer disease with perforation, rectal bleeding requiring hospitalization 2019.  Today the patient was found to be weak and disoriented by her sister.  She has had bright red blood per rectum for about 1 week.  Recommended that the patient be transferred to the emergency department for further evaluation right away.  The patient and her sister are in agreement, they plan to call [phone] for transfer  Moderate medical decision making today  Signed Lamar Blinks, MD

## 2019-07-11 MED ORDER — LORAZEPAM 2 MG/ML IJ SOLN
0.50 | INTRAMUSCULAR | Status: DC
Start: ? — End: 2019-07-11

## 2019-07-11 MED ORDER — FERROUS SULFATE 325 (65 FE) MG PO TABS
325.00 | ORAL_TABLET | ORAL | Status: DC
Start: 2019-07-12 — End: 2019-07-11

## 2019-07-11 MED ORDER — FAMOTIDINE 20 MG PO TABS
20.00 | ORAL_TABLET | ORAL | Status: DC
Start: 2019-07-11 — End: 2019-07-11

## 2019-07-11 MED ORDER — ENOXAPARIN SODIUM 40 MG/0.4ML ~~LOC~~ SOLN
40.00 | SUBCUTANEOUS | Status: DC
Start: 2019-07-11 — End: 2019-07-11

## 2019-07-11 MED ORDER — GENERIC EXTERNAL MEDICATION
Status: DC
Start: ? — End: 2019-07-11

## 2019-07-11 MED ORDER — ESCITALOPRAM OXALATE 10 MG PO TABS
10.00 | ORAL_TABLET | ORAL | Status: DC
Start: 2019-07-11 — End: 2019-07-11

## 2019-07-11 MED ORDER — MORPHINE SULFATE 2 MG/ML IJ SOLN
1.00 | INTRAMUSCULAR | Status: DC
Start: ? — End: 2019-07-11

## 2019-07-11 MED ORDER — AZITHROMYCIN 250 MG PO TABS
250.00 | ORAL_TABLET | ORAL | Status: DC
Start: 2019-07-12 — End: 2019-07-11

## 2019-07-11 MED ORDER — AMOXICILLIN-POT CLAVULANATE 875-125 MG PO TABS
875.00 | ORAL_TABLET | ORAL | Status: DC
Start: 2019-07-11 — End: 2019-07-11

## 2019-07-12 MED ORDER — GENERIC EXTERNAL MEDICATION
Status: DC
Start: ? — End: 2019-07-12

## 2019-07-16 DIAGNOSIS — J449 Chronic obstructive pulmonary disease, unspecified: Secondary | ICD-10-CM | POA: Diagnosis not present

## 2019-07-27 ENCOUNTER — Telehealth: Payer: Self-pay

## 2019-07-27 NOTE — Telephone Encounter (Signed)
Pt passed away yesterday, 08-19-2019.

## 2019-07-27 NOTE — Telephone Encounter (Signed)
Thank you, she was a very nice lady

## 2019-08-11 ENCOUNTER — Other Ambulatory Visit: Payer: Self-pay | Admitting: Internal Medicine

## 2019-08-24 DEATH — deceased

## 2021-04-27 IMAGING — DX CHEST - 2 VIEW
2 series · 2 of 2 positions shown · non-contrast
Comparison: Radiographs March 20, 2016.

CLINICAL DATA: Centrilobular emphysema.

EXAM:
CHEST - 2 VIEW

[chest pa]
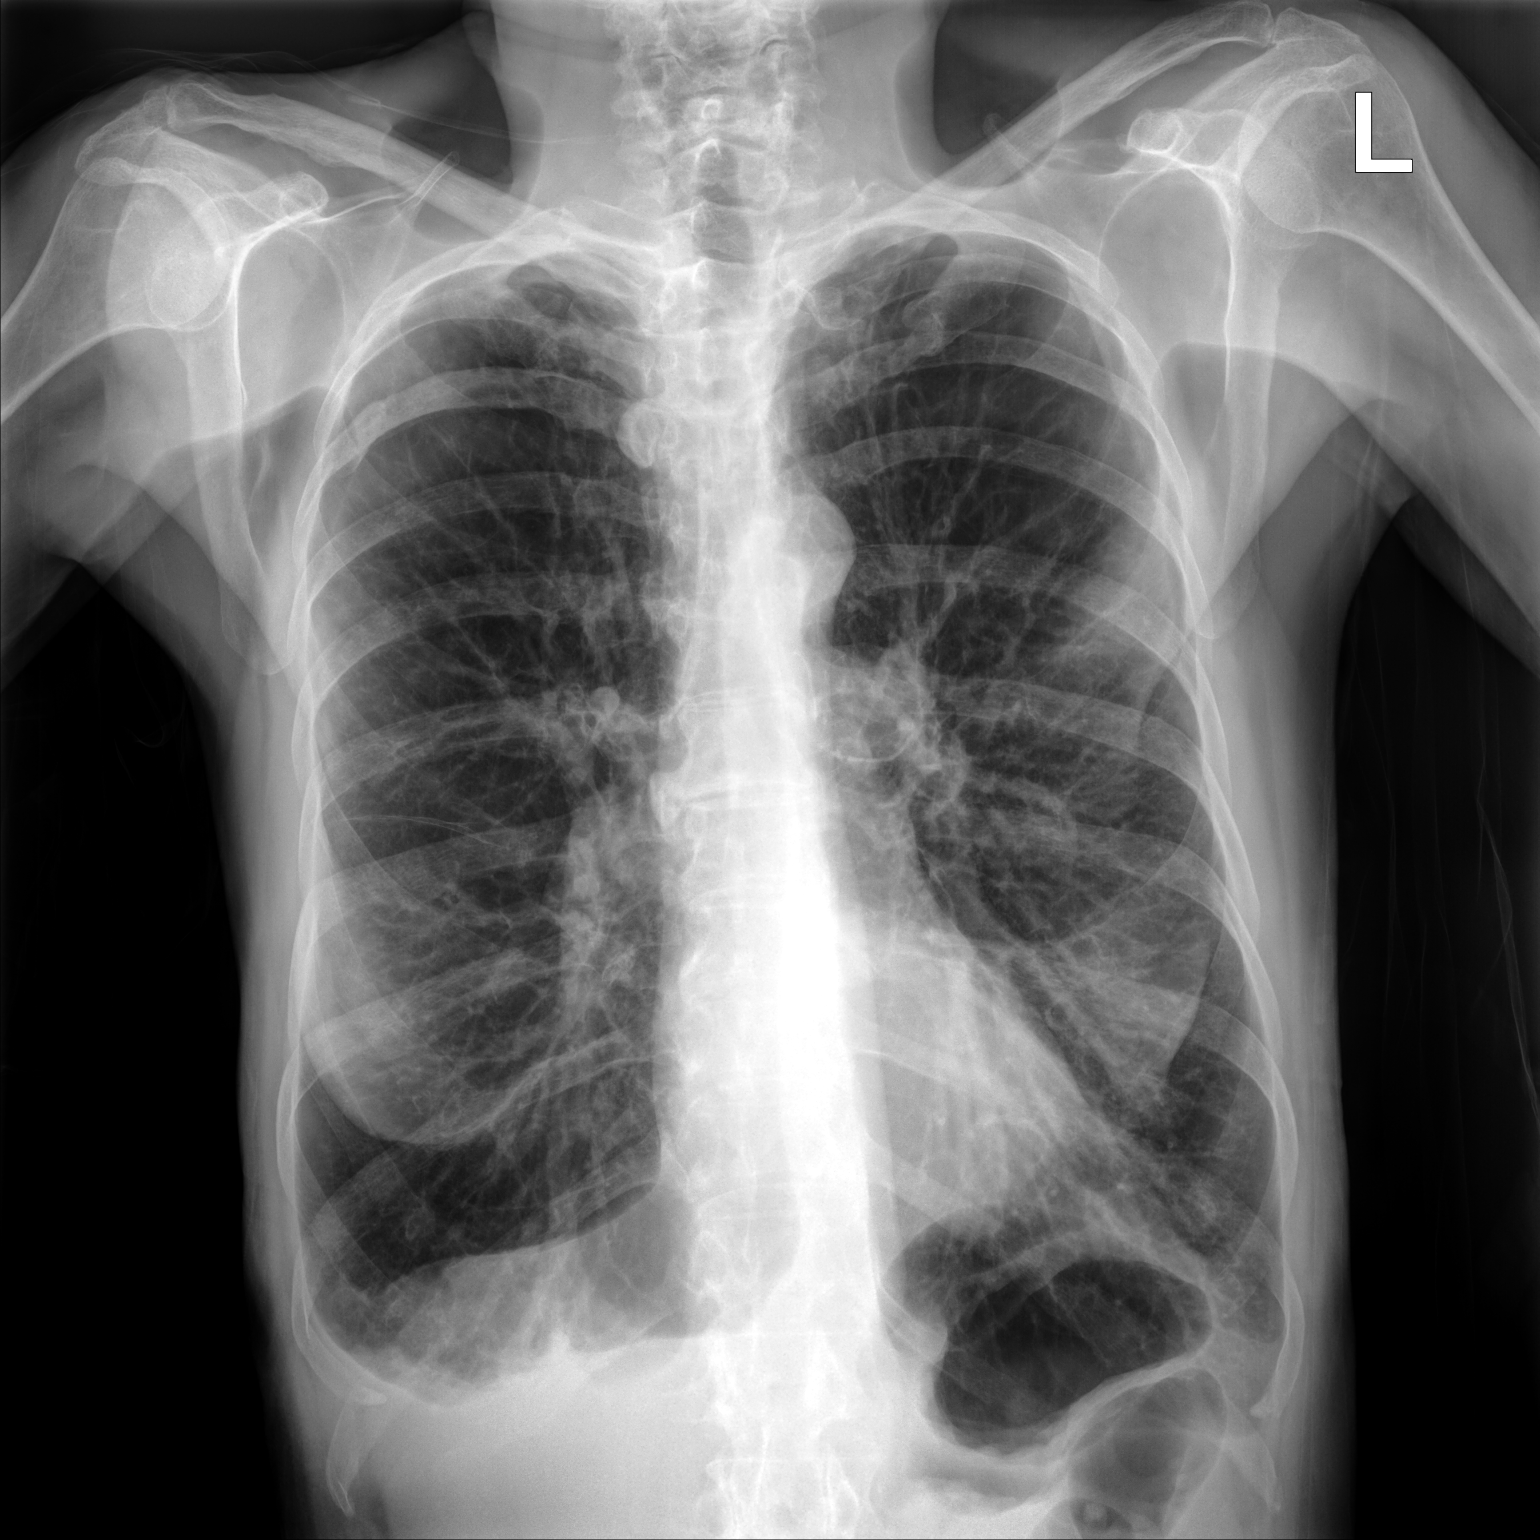

[chest lat]
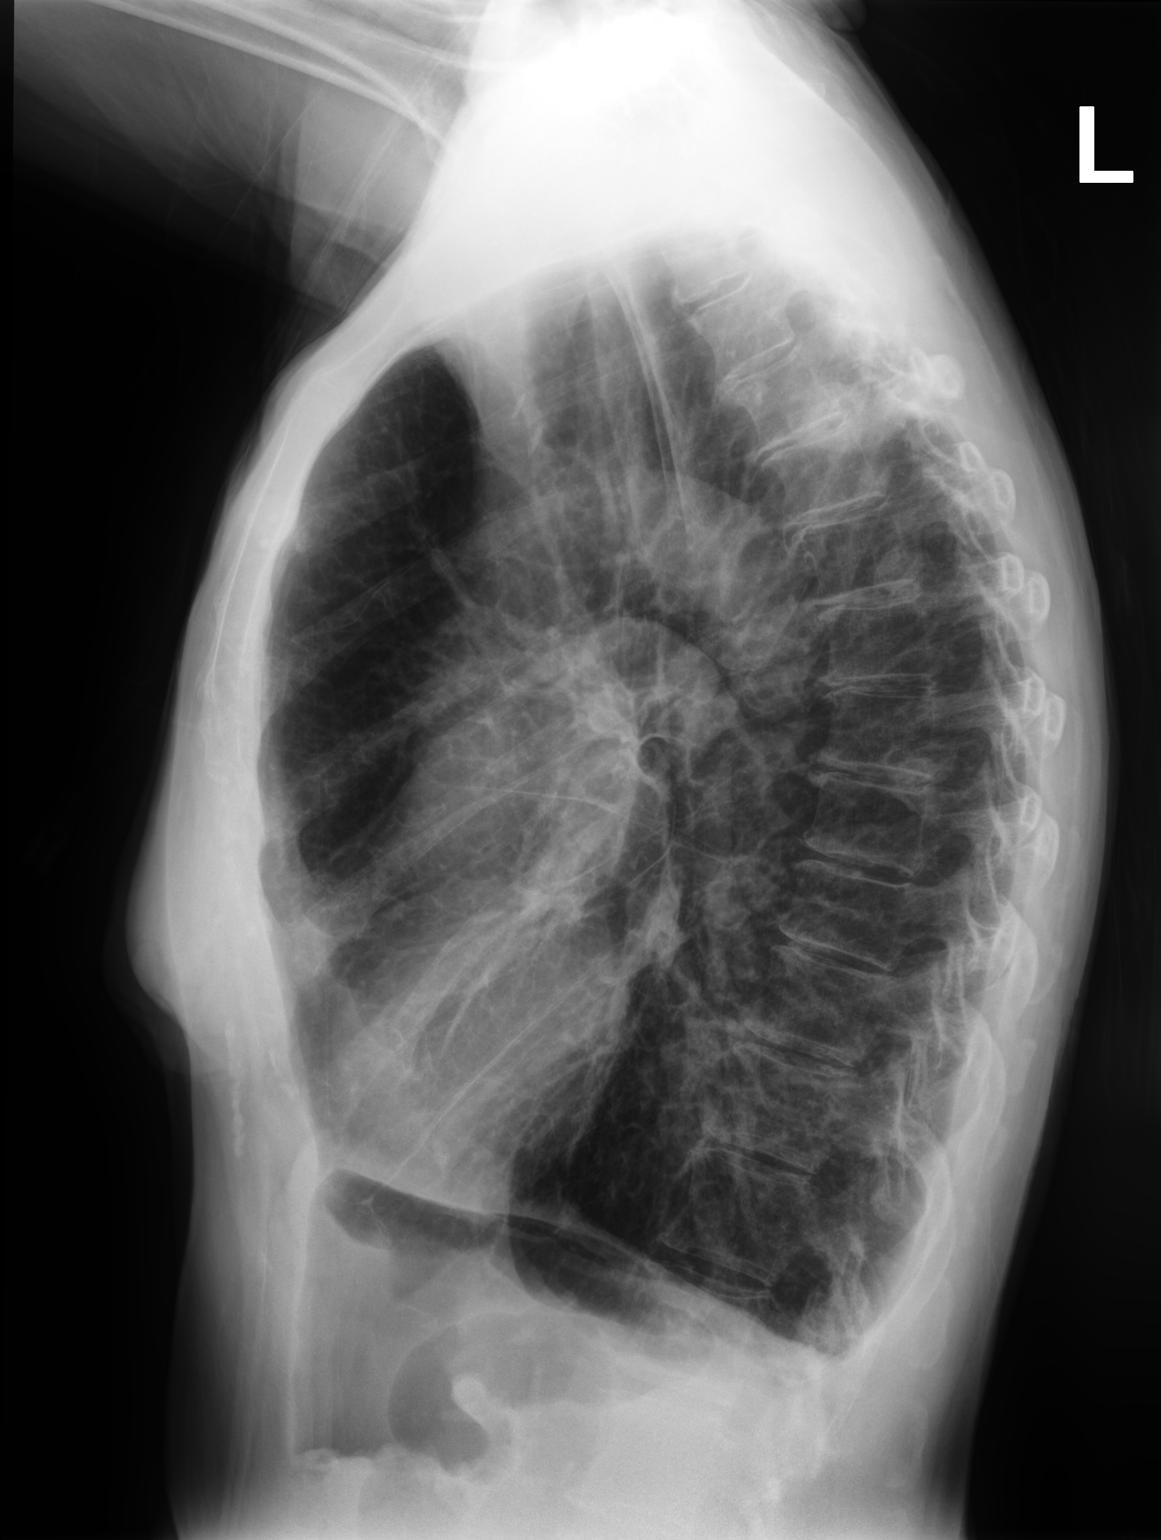

[2 of 2 positions shown; findings below may reference images not displayed]

FINDINGS: The heart size and mediastinal contours are within normal limits.
Stable hyperinflation of the lungs is noted consistent with chronic
obstructive pulmonary disease. No pneumothorax or pleural effusion
is noted. No acute pulmonary process is noted. The visualized
skeletal structures are unremarkable.
IMPRESSION: Stable hyperinflation of the lungs is noted consistent with chronic
obstructive pulmonary disease. No acute abnormality is noted.
